# Patient Record
Sex: Male | Born: 1945 | Race: White | Hispanic: No | Marital: Married | Smoking: Former smoker
Health system: Southern US, Community
[De-identification: ages and names within clinical notes are randomized; demographics above are authoritative.]

## PROBLEM LIST (undated history)

## (undated) ENCOUNTER — Emergency Department (HOSPITAL_COMMUNITY): Admission: EM | Source: Home / Self Care

## (undated) DIAGNOSIS — Z720 Tobacco use: Secondary | ICD-10-CM

## (undated) DIAGNOSIS — J449 Chronic obstructive pulmonary disease, unspecified: Secondary | ICD-10-CM

## (undated) DIAGNOSIS — E785 Hyperlipidemia, unspecified: Secondary | ICD-10-CM

## (undated) DIAGNOSIS — I2111 ST elevation (STEMI) myocardial infarction involving right coronary artery: Secondary | ICD-10-CM

## (undated) DIAGNOSIS — I213 ST elevation (STEMI) myocardial infarction of unspecified site: Secondary | ICD-10-CM

## (undated) DIAGNOSIS — I6529 Occlusion and stenosis of unspecified carotid artery: Secondary | ICD-10-CM

## (undated) DIAGNOSIS — C140 Malignant neoplasm of pharynx, unspecified: Secondary | ICD-10-CM

## (undated) HISTORY — DX: Hyperlipidemia, unspecified: E78.5

## (undated) HISTORY — DX: Chronic obstructive pulmonary disease, unspecified: J44.9

## (undated) HISTORY — DX: Occlusion and stenosis of unspecified carotid artery: I65.29

## (undated) HISTORY — PX: APPENDECTOMY: SHX54

## (undated) HISTORY — DX: Tobacco use: Z72.0

## (undated) HISTORY — DX: ST elevation (STEMI) myocardial infarction involving right coronary artery: I21.11

## (undated) HISTORY — DX: ST elevation (STEMI) myocardial infarction of unspecified site: I21.3

---

## 2003-08-15 ENCOUNTER — Emergency Department (HOSPITAL_COMMUNITY): Admission: EM | Admit: 2003-08-15 | Discharge: 2003-08-15 | Payer: Self-pay | Admitting: Emergency Medicine

## 2003-08-15 ENCOUNTER — Encounter: Payer: Self-pay | Admitting: Emergency Medicine

## 2003-08-18 ENCOUNTER — Ambulatory Visit (HOSPITAL_COMMUNITY): Admission: RE | Admit: 2003-08-18 | Discharge: 2003-08-18 | Payer: Self-pay | Admitting: Otolaryngology

## 2003-08-19 ENCOUNTER — Encounter: Payer: Self-pay | Admitting: Otolaryngology

## 2003-08-23 ENCOUNTER — Ambulatory Visit (HOSPITAL_BASED_OUTPATIENT_CLINIC_OR_DEPARTMENT_OTHER): Admission: RE | Admit: 2003-08-23 | Discharge: 2003-08-23 | Payer: Self-pay | Admitting: Otolaryngology

## 2003-08-23 ENCOUNTER — Encounter (INDEPENDENT_AMBULATORY_CARE_PROVIDER_SITE_OTHER): Payer: Self-pay | Admitting: *Deleted

## 2003-08-23 ENCOUNTER — Ambulatory Visit (HOSPITAL_COMMUNITY): Admission: RE | Admit: 2003-08-23 | Discharge: 2003-08-23 | Payer: Self-pay | Admitting: Otolaryngology

## 2003-09-03 ENCOUNTER — Ambulatory Visit: Admission: RE | Admit: 2003-09-03 | Discharge: 2003-11-11 | Payer: Self-pay | Admitting: Radiation Oncology

## 2003-11-20 HISTORY — PX: THROAT SURGERY: SHX803

## 2003-12-09 ENCOUNTER — Ambulatory Visit: Admission: RE | Admit: 2003-12-09 | Discharge: 2003-12-09 | Payer: Self-pay | Admitting: Radiation Oncology

## 2004-01-18 ENCOUNTER — Encounter: Admission: RE | Admit: 2004-01-18 | Discharge: 2004-01-18 | Payer: Self-pay | Admitting: Otolaryngology

## 2004-01-19 ENCOUNTER — Ambulatory Visit (HOSPITAL_COMMUNITY): Admission: RE | Admit: 2004-01-19 | Discharge: 2004-01-19 | Payer: Self-pay | Admitting: Otolaryngology

## 2004-01-21 ENCOUNTER — Encounter (INDEPENDENT_AMBULATORY_CARE_PROVIDER_SITE_OTHER): Payer: Self-pay | Admitting: Specialist

## 2004-01-21 ENCOUNTER — Inpatient Hospital Stay (HOSPITAL_COMMUNITY): Admission: RE | Admit: 2004-01-21 | Discharge: 2004-01-29 | Payer: Self-pay | Admitting: Otolaryngology

## 2004-03-06 ENCOUNTER — Encounter: Admission: RE | Admit: 2004-03-06 | Discharge: 2004-04-04 | Payer: Self-pay | Admitting: Otolaryngology

## 2004-09-27 ENCOUNTER — Ambulatory Visit (HOSPITAL_COMMUNITY): Admission: RE | Admit: 2004-09-27 | Discharge: 2004-09-27 | Payer: Self-pay | Admitting: Otolaryngology

## 2004-10-04 ENCOUNTER — Ambulatory Visit (HOSPITAL_COMMUNITY): Admission: RE | Admit: 2004-10-04 | Discharge: 2004-10-04 | Payer: Self-pay | Admitting: Otolaryngology

## 2005-03-14 ENCOUNTER — Ambulatory Visit (HOSPITAL_COMMUNITY): Admission: RE | Admit: 2005-03-14 | Discharge: 2005-03-14 | Payer: Self-pay | Admitting: Otolaryngology

## 2006-03-22 ENCOUNTER — Ambulatory Visit (HOSPITAL_COMMUNITY): Admission: RE | Admit: 2006-03-22 | Discharge: 2006-03-22 | Payer: Self-pay | Admitting: Otolaryngology

## 2008-02-06 ENCOUNTER — Encounter: Admission: RE | Admit: 2008-02-06 | Discharge: 2008-02-06 | Payer: Self-pay | Admitting: Otolaryngology

## 2009-04-21 ENCOUNTER — Emergency Department (HOSPITAL_COMMUNITY): Admission: EM | Admit: 2009-04-21 | Discharge: 2009-04-21 | Payer: Self-pay | Admitting: Emergency Medicine

## 2009-12-30 ENCOUNTER — Inpatient Hospital Stay (HOSPITAL_COMMUNITY): Admission: EM | Admit: 2009-12-30 | Discharge: 2009-12-31 | Payer: Self-pay | Admitting: Emergency Medicine

## 2011-02-07 LAB — BASIC METABOLIC PANEL
BUN: 6 mg/dL (ref 6–23)
BUN: 8 mg/dL (ref 6–23)
CO2: 27 mEq/L (ref 19–32)
CO2: 29 mEq/L (ref 19–32)
Calcium: 8.3 mg/dL — ABNORMAL LOW (ref 8.4–10.5)
Calcium: 8.9 mg/dL (ref 8.4–10.5)
Chloride: 104 mEq/L (ref 96–112)
Creatinine, Ser: 1.17 mg/dL (ref 0.4–1.5)
GFR calc Af Amer: >60 mL/min (ref >60)
GFR calc non Af Amer: >60 mL/min (ref >60)
GFR calc non Af Amer: >60 mL/min (ref >60)
Glucose, Bld: 107 mg/dL — ABNORMAL HIGH (ref 70–99)
Glucose, Bld: 111 mg/dL — ABNORMAL HIGH (ref 70–99)
Potassium: 4 mEq/L (ref 3.5–5.1)
Potassium: 4 mEq/L (ref 3.5–5.1)
Sodium: 138 mEq/L (ref 135–145)

## 2011-02-07 LAB — CARDIAC PANEL(CRET KIN+CKTOT+MB+TROPI)
CK, MB: 1.6 ng/mL (ref 0.3–4.0)
CK, MB: 1.9 ng/mL (ref 0.3–4.0)
Relative Index: 1.1 (ref 0.0–2.5)
Total CK: 149 U/L (ref 7–232)
Total CK: 180 U/L (ref 7–232)
Troponin I: 0.01 ng/mL (ref 0.00–0.06)

## 2011-02-07 LAB — CBC
HCT: 35 % — ABNORMAL LOW (ref 39.0–52.0)
HCT: 36.3 % — ABNORMAL LOW (ref 39.0–52.0)
HCT: 43.3 % (ref 39.0–52.0)
Hemoglobin: 12.5 g/dL — ABNORMAL LOW (ref 13.0–17.0)
Hemoglobin: 14.8 g/dL (ref 13.0–17.0)
MCHC: 34.2 g/dL (ref 30.0–36.0)
MCHC: 34.5 g/dL (ref 30.0–36.0)
MCHC: 34.5 g/dL (ref 30.0–36.0)
MCV: 90.4 fL (ref 78.0–100.0)
MCV: 90.7 fL (ref 78.0–100.0)
Platelets: 135 10*3/uL — ABNORMAL LOW (ref 150–400)
Platelets: 144 10*3/uL — ABNORMAL LOW (ref 150–400)
Platelets: 168 10*3/uL (ref 150–400)
RBC: 4.01 MIL/uL — ABNORMAL LOW (ref 4.22–5.81)
RBC: 4.79 MIL/uL (ref 4.22–5.81)
RDW: 13.2 % (ref 11.5–15.5)
RDW: 13.4 % (ref 11.5–15.5)
RDW: 13.5 % (ref 11.5–15.5)
WBC: 8.2 10*3/uL (ref 4.0–10.5)
WBC: 8.6 10*3/uL (ref 4.0–10.5)

## 2011-02-07 LAB — DIFFERENTIAL
Basophils Absolute: 0 10*3/uL (ref 0.0–0.1)
Basophils Absolute: 0 10*3/uL (ref 0.0–0.1)
Basophils Relative: 0 % (ref 0–1)
Basophils Relative: 0 % (ref 0–1)
Eosinophils Absolute: 0 10*3/uL (ref 0.0–0.7)
Eosinophils Relative: 0 % (ref 0–5)
Eosinophils Relative: 6 % — ABNORMAL HIGH (ref 0–5)
Lymphocytes Relative: 21 % (ref 12–46)
Lymphocytes Relative: 29 % (ref 12–46)
Lymphs Abs: 1.8 10*3/uL (ref 0.7–4.0)
Monocytes Absolute: 0.7 10*3/uL (ref 0.1–1.0)
Monocytes Absolute: 0.7 10*3/uL (ref 0.1–1.0)
Monocytes Relative: 8 % (ref 3–12)
Neutro Abs: 6 10*3/uL (ref 1.7–7.7)
Neutrophils Relative %: 70 % (ref 43–77)

## 2011-02-07 LAB — MAGNESIUM: Magnesium: 1.9 mg/dL (ref 1.5–2.5)

## 2011-02-07 LAB — COMPREHENSIVE METABOLIC PANEL
ALT: 17 U/L (ref 0–53)
AST: 23 U/L (ref 0–37)
Albumin: 3 g/dL — ABNORMAL LOW (ref 3.5–5.2)
Alkaline Phosphatase: 63 U/L (ref 39–117)
BUN: 9 mg/dL (ref 6–23)
CO2: 26 mEq/L (ref 19–32)
Calcium: 7.7 mg/dL — ABNORMAL LOW (ref 8.4–10.5)
Chloride: 109 mEq/L (ref 96–112)
Creatinine, Ser: 1.13 mg/dL (ref 0.4–1.5)
GFR calc Af Amer: >60 mL/min (ref >60)
GFR calc non Af Amer: >60 mL/min (ref >60)
Glucose, Bld: 120 mg/dL — ABNORMAL HIGH (ref 70–99)
Potassium: 3.8 mEq/L (ref 3.5–5.1)
Sodium: 138 mEq/L (ref 135–145)
Total Bilirubin: 0.5 mg/dL (ref 0.3–1.2)
Total Protein: 6.1 g/dL (ref 6.0–8.3)

## 2011-02-07 LAB — CK TOTAL AND CKMB (NOT AT ARMC)
CK, MB: 1.1 ng/mL (ref 0.3–4.0)
Relative Index: INVALID (ref 0.0–2.5)
Total CK: 93 U/L (ref 7–232)

## 2011-02-07 LAB — LACTIC ACID, PLASMA: Lactic Acid, Venous: 1.2 mmol/L (ref 0.5–2.2)

## 2011-02-07 LAB — TROPONIN I: Troponin I: 0.01 ng/mL (ref 0.00–0.06)

## 2011-02-07 LAB — MRSA PCR SCREENING

## 2011-02-26 LAB — POCT CARDIAC MARKERS
CKMB, poc: <1 ng/mL — ABNORMAL LOW (ref 1.0–8.0)
Myoglobin, poc: 196 ng/mL (ref 12–200)
Troponin i, poc: <0.05 ng/mL (ref 0.00–0.09)

## 2011-02-26 LAB — POCT I-STAT, CHEM 8
BUN: 16 mg/dL (ref 6–23)
Chloride: 104 mEq/L (ref 96–112)
Creatinine, Ser: 1.3 mg/dL (ref 0.4–1.5)
Potassium: 3.8 mEq/L (ref 3.5–5.1)
Sodium: 136 mEq/L (ref 135–145)
TCO2: 20 mmol/L (ref 0–100)

## 2011-02-26 LAB — CBC
HCT: 43.2 % (ref 39.0–52.0)
Platelets: 205 10*3/uL (ref 150–400)
RDW: 12.8 % (ref 11.5–15.5)
WBC: 3.9 10*3/uL — ABNORMAL LOW (ref 4.0–10.5)

## 2011-02-26 LAB — DIFFERENTIAL
Basophils Absolute: 0 10*3/uL (ref 0.0–0.1)
Eosinophils Relative: 0 % (ref 0–5)
Lymphocytes Relative: 25 % (ref 12–46)
Lymphs Abs: 1 10*3/uL (ref 0.7–4.0)
Neutro Abs: 2.4 10*3/uL (ref 1.7–7.7)
Neutrophils Relative %: 63 % (ref 43–77)

## 2011-04-06 NOTE — Op Note (Signed)
NAME:  Cory Sloan, Cory Sloan NO.:  [id number]   MEDICAL RECORD NO.:  [id number]                   PATIENT TYPE:  AMB   LOCATION:  DSC                                  FACILITY:  [facility]   PHYSICIAN:  Karol T. Lazarus Salines, M.D.              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   POSTOPERATIVE DIAGNOSIS:  T1, N0, M0 squamous cell carcinoma, left vocal  cord.   OPERATION PERFORMED:   SURGEON:  Karol T. Lazarus Salines, M.D.   ANESTHESIA:  General bronchoscopic converted to general orotracheal  anesthesia.   ESTIMATED BLOOD LOSS:  Minimal.   COMPLICATIONS:  None.   FINDINGS:  A bulky plaque-like tumor involving the superior surface and free  edge of the left vocal cord from just in front of the vocal process all the  way to the anterior commissure.  No obvious subglottic involvement.  Into  the ventricle but not defacing the ventricle or onto the false cord.  Some  polypoid changes of the right vocal cord but no obvious neoplasm.  No neck  adenopathy.  Frozen section positive for invasive squamous cell carcinoma.   DESCRIPTION OF PROCEDURE:  With the patient in a comfortable supine  position, general mask anesthesia was administered.  At an appropriate  level, the table was turned 90 degrees and the patient placed in reverse  Trendelenburg.  A moist 4 x 4 was used to protect the upper gums.  The  Jackson laryngoscope was introduced, insinuated under the epiglottis, and  the glottis was visualized.  The lesion was briefly inspected and a biopsy  was taken for frozen section.  The slide was removed from the laryngoscope  and the bronchoscope was introduced through the laryngoscope into the  trachea without difficulty.  Ventilation was assumed per bronchoscope.  Full  rigid bronchoscopy down to the level of the  secondary bronchi was performed  with no significant findings.  Bronchial washings were taken from the left  and right mainstem bronchi in the standard fashion.  The bronchoscope was  removed.   The Jackson laryngoscope was reintroduced and under direct visualization,  the patient was intubated with a 6.5 mm PVC tube.  Ventilation was assumed  per endotracheal tube.   The cervical esophagoscope was lubricated and inserted into the pharynx and  passed gently to the cricopharyngeus. It was inserted to its full length  with no significant findings and then removed.  Both necks were palpated and  the entire oral cavity was also palpated with no significant findings.  At  this point the Dedo laryngoscope was introduced and carefully poised in the  supraglottis and positioned for good visualization.  It was suspended in the  standard fashion.  The microscope was brought into the field and the  lesion  could be more carefully inspected.  The scope was repositioned several times  to assess the full extent of the tumor.  Photographs were taken.  A biopsy  was taken just lateral to the anterior commissure and just in front of the  vocal process to determine the anterior and posterior extent of the lesion.  A biopsy was taken from the apparent polypoid changes in the midbody of the  right vocal cord. These were sent for a permanent section.  4% cocaine  solution moistened pledgets were placed against the vocal cord for  anesthesia and hemostasis and allowed to remain in place for several  minutes.  Upon removal, hemostasis was observed.  At this point the  laryngoscope was unsuspended.  The endotracheal tube was moved forward, the  posterior commissure was inspected.  The valleculae, piriforms and  postcricoid larynx were inspected, all with no significant findings.  At  this point the procedure was completed.  The scope was removed as was the  gum protector.  There was mild bruising of the right  upper alveolar mucosa.  The patient was returned to anesthesia, awakened, extubated, and transferred  to recovery in stable condition.   COMMENT:  A 65 year old white male with a 40+ pack year smoking history  presented to my office last week with a several month history of progressive  hoarseness and abnormal examination. Hence the indication for today's  procedure.  Although this is still a T1 lesion, it represents a large T1  lesion that I do not think is resectable with a laser.  We will await the  biopsy results.  He is a candidate for radiation therapy versus  hemilaryngectomy for cure.  Given low anticipated risks of post anesthetic  or post surgical complications,  I feel an outpatient venue is appropriate.                                               Gloris Manchester. Lazarus Salines, M.D.    KTW/MEDQ  D:  08/23/2003  T:  08/23/2003  Job:  [id number]

## 2011-04-06 NOTE — Discharge Summary (Signed)
NAME:  Cory Sloan, Cory Sloan NO.:  [id number]   MEDICAL RECORD NO.:  [id number]                   PATIENT TYPE:  INP   LOCATION:  5740                                 FACILITY:  [facility]   PHYSICIAN:  Zola Button T. Lazarus Salines, M.D.              DATE OF BIRTH:  October 05, 1946   DATE OF ADMISSION:  01/21/2004  DATE OF DISCHARGE:  01/29/2004                                 DISCHARGE SUMMARY   CHIEF COMPLAINT:  Laryngeal cancer.   HISTORY OF PRESENT ILLNESS:  A 65 year old white male, 40-pack-year smoker,  had persistent hoarseness with a T1, N0, Stage I squamous cell carcinoma of  the left vocal cord in October 2004.  He received full course radiation  therapy for cure.  Post treatment, he had persistent irregularity and then  recent swelling and pain suggesting either radio chondronecrosis versus  recurrent carcinoma.  No neck masses.  He is swallowing comfortably and  maintaining his weight.   PAST MEDICAL HISTORY:   ALLERGIES:  No known allergies.   CURRENT MEDICATIONS:  He is currently on Cipro and clindamycin.   No history of diabetes, stroke, asthma, epilepsy, HIV, hepatitis.  No  history of bleeding problems or anesthetic reactions.  No history of blood  clots.  No family history of bleeding or anesthetic problems.   REVIEW OF SYSTEMS:  Positive for reflux.   PHYSICAL EXAMINATION:  GENERAL:  Sturdy, trim, basically healthy-appearing,  middle-aged white male.  He is quite raspy with slight stridor.  HEAD AND NECK:  Examination remarkable for swollen supraglottis with  irregular vocal cord and a fair airway at best.  No neck adenopathy.  CHEST: Mild wheezing.  HEART:  Regular rate and rhythm without murmurs.  ABDOMEN:  Soft and active.  EXTREMITIES:  Normal configuration.   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   HOSPITAL COURSE:  On the day of his admission, the patient was taken to the  operating room for  direct laryngoscopy and biopsy with plan to perform a  tracheostomy and consider hyperbaric oxygen therapy if this was in fact  radionecrosis and to proceed with a total laryngectomy if it was cancer.  The frozen section was positive for recurrent carcinoma.  The patient  underwent a total laryngectomy.  Postoperatively, he did nicely with minimal  wound drainage and with a clean wide open stoma.  The patient was promptly  begun on self and family home care teaching.  Speech pathology worked with  the patient regarding alternate speech modalities.  He was hungry quickly  and healing quickly.  He spent three days in the intensive care unit and  then out to the regular floor.   The drains were removed on the fourth postoperative day.  He had social  services assist with finances.  The family was interested in considering a  tracheoesophageal puncture procedure for speech.  I deferred this until his  recovery was more complete.  The patient did nicely with the electrolarynx  in the hospital.  Home health was used to arrange for home needs including  humidifier, suction, and various supplies.  The patient had nasogastric  feeding during the first six postoperative days with good tolerance.  On the  sixth postoperative day, he had a grape juice trial which was successful,  and at this point he resumed p.o. liquids.  The wound drains had been  removed on the fourth postoperative day.   He was discharged to his home on the eighth postoperative day to the care of  his family.  He had prescriptions at home for clindamycin liquid, Roxicet  liquid, and Tylenol with Codeine liquid.  He was instructed to use dietary  supplements including Ensure or Boost.  He had been taught wound care and  especially stoma care.  He will follow up as an outpatient with speech  pathology.  He will have the home visiting nurses coming daily for a short  period of time until things are stable and completely healed.  I  will see  him back in my office five days after discharge.   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   PROCEDURES:  Direct laryngoscopy and biopsy, total laryngectomy.   COMPLICATIONS:  None.   CONDITION ON DISCHARGE:  Ambulatory, pain controlled, taking p.o. fluids,  and breathing comfortably through his stoma.   PRESCRIPTIONS:  Clindamycin, Roxicet, Tylenol with codeine.   RETURN VISIT:  In five days.                                                Gloris Manchester. Lazarus Salines, M.D.    KTW/MEDQ  D:  03/11/2004  T:  03/13/2004  Job:  [id number]

## 2011-04-06 NOTE — Op Note (Signed)
NAME:  Cory Sloan, Cory Sloan NO.:  [id number]   MEDICAL RECORD NO.:  [id number]                   PATIENT TYPE:  INP   LOCATION:  2113                                 FACILITY:  [facility]   PHYSICIAN:  Gloris Manchester. Lazarus Salines, M.D.              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   POSTOPERATIVE DIAGNOSIS:  Recurrent carcinoma.   PROCEDURE PERFORMED:  Total laryngectomy, direct laryngoscopy and biopsy.   SURGEON:  Gloris Manchester. Lazarus Salines, M.D.   ASSISTANT:  Kathy Breach, M.D.   ANESTHESIA:  General orotracheal converted to general tracheostomal.   ESTIMATED BLOOD LOSS:  100 mL.   COMPLICATIONS:  None.   FINDINGS:  A dense ulcerated and somewhat exophytic lesion occupying the  bulk of the left true vocal cord from the vocal process all the way forward  to the anterior commissure and down into the ventricle.  No involvement of  the epiglottis or false cord.  No subglottic extension.  No neck adenopathy.  Frozen section positive for invasive squamous cell carcinoma.   DESCRIPTION OF PROCEDURE:  The patient was the patient in a comfortable  supine position, general inhalation was administered.  At an appropriate  level, the table was turned 90 degrees and the patient placed in a slight  reverse Trendelenburg.  A moist 4 x 4 was used to protect the upper gum.  The Holinger laryngoscope was introduced to inspect the larynx.  The patient  had some coughing, and anesthesia was concerned about the possibility of  laryngospasm.  At this point, the patient received muscle relaxants.  Ventilation was continued per mask.   The laryngoscope was introduced once again.  There was substantial edema of  the left aryepiglottic fold, arytenoid, medial wall of the  piriform sinus  and the postcricoid mucosa.  The epiglottis was relatively normal.  The  right larynx was normal.  The lesion as described above on the left anterior  vocal cord was identified.  Several biopsies were taken and sent for frozen  section interpretation.  Hemostasis was spontaneous.   After getting the biopsies out, the [facility] laryngoscope was introduced and  the patient was intubated without difficulty.  Anesthesia was obtained per  orotracheal tube.   The Holinger laryngoscope was reintroduced and full inspection of the larynx  and hypopharynx was performed with the findings as described above.  Palpation revealed no lesions of the base of tongue.  Palpation of the neck  revealed no adenopathy.   The pathology report returned as unequivocal invasive squamous cell  carcinoma.  Election was made to proceed with total laryngectomy as planned.  The family was informed.   At this point, the patient was turned back up toward anesthesia.  A shoulder  roll was placed and the neck was thoroughly extended.  The neck was palpated  once again with the findings as described above.  A Foley catheter was  inserted in the standard fashion.  A sterile preparation and draping from  the mouth down to the clavicles and including both sides of the neck was  performed.   Again the findings were as described above.  A pre-existing skin wrinkle  running across the prominence of the thyroid cartilage was selected for the  incision and a 16 cm transverse incision was sharply executed.  There was  moderate bleeding from the subdermal plexus consistent with prior radiation.  This was controlled with cautery.  Using the cautery, the platysma muscle  was lysed on both sides and subplatysmal planes were elevated superiorly to  the level of the hyoid bone and inferiorly to the sternal notch in the  midline.  Tie-back stitches were not required.  Working in the lateral  surface of the  sternocleidomastoid muscle, the fascia was incised and rolled  medially.  Anterior jugular veins were identified and were divided high and  low.  The dissection was carried down between the heads of the  sternocleidomastoid muscles down to the strap muscles and in a triangular  fashion all the way across the hyoid bone.  The fascia was dissected free  from the lateral surfaces of the strap muscles toward the midline.  The  fascia inferiorly was swept upward.  The strap muscles were then divided in  the midline and the small thyroid isthmus was identified and divided to the  right of the midline and controlled with a 2-0 silk suture ligature.  The  thyroid was cleaned from the face of the trachea.  The fascia was worked off  the lateral surface of the strap muscles and then the sternothyroid and  sternohyoid muscles and omohyoid muscles were divided lateral to the larynx  and kept with the specimen.  Dissection was deepened at the level of the  hyoid bone.  Working the thyroid lobe free from the trachea on both sides,  the dissection was carried along the medial edge of the thyroid lobe.  The  thyroid vascular pedicle was identified and divided.  Working further  superiorly, the superior laryngeal pedicle was identified and divided.  Both  of these were controlled with 3-0 silk ligature.  The larynx at this point  was fully skeletonized.  The constrictor muscles were divided from the  posterior free edge of the thyroid cartilage  on both sides.  The greater  cornu of the thyroid cartilage was developed and dissected.  The hyoid bone  was grasped and using a curved Mayo scissors hugging the medial surface, the  greater horn was delivered on each side.  Using cautery, the midportion of  the hyoid bone was delivered.  At this point, the piriform was entered on  the right side and under direct vision carried up to the vallecula and down  to the postcricoid larynx.  In the second-third  interspace, the trachea was entered, dividing in between  the rings.  At this point under direct vision the patient was intubated  directly into the trachea and the orotracheal tube was removed.  An inverted  V mucosa was performed on the posterior surface of the trachea.  The trachea  was supported with an Allis clamp to prevent retraction into the  mediastinum.  Ventilation was continued per the tracheal stump.   An [employer]  clamp was used to grasp the proximal trachea and working in the  gray line between the tracheal mucosa and the esophagus, the dissection  was carried upward to the postcricoid larynx.  Branches of the subpharyngeal  plexus were controlled with cautery.   Returning to the larynx, the epiglottis was grasped and delivered.  Working  around the vallecula and working around the postcricoid larynx inferiorly,  preserving as much mucosa as possible, finally the larynx was fully excised.  Hemostasis was observed.   The larynx was taken from the field and sectioned, revealing the invasive  tumor on the anterior left vocal cord.  Good margins were felt to have been  accomplished.  The tumor was sent for permanent pathologic interpretation.  All gloves and instruments were changed that had contacted the tumor.   At this point, a circular stoma was fashioned in the skin inferior to the  incision.  The dissection was carried out approximately 1 cm in all  directions in the subdermal fat to enhance the final healing configuration  of the stoma.  Ethibond 2-0 sutures were placed in a vertical mattress  fashion through the skin, behind the second tracheal ring submucosally, and  back out through the skin in all four quadrants.  This brought the trachea  up to the skin.  The endotracheal tube was passed through the stoma into the  trachea once again and ventilation continued.   The pharynx was closed using 3-0 Vicryl running inverting sutures in a T  configuration.  A second layer  of serosa was reapproximated over the suture  line.  An airtight closure was felt to have been accomplished.  Note that  prior to closing the pharynx, a nasogastric tube was passed through the left  nose and into the stomach.   After closing the pharynx, the neck was thoroughly irrigated.  Hemostasis  was observed.  Valsalva was used and again hemostasis was observed.  Ten  French Hemovac drains were placed in both sides of the neck and secured to  the neck with a 2-0 Ethilon stitch.   The stoma was completed with a running simple 5-0 chromic suture between the  mucosa and the skin edge in a circumferential fashion.   At this point, the neck flaps were reapproximated after first removing the  shoulder roll.  Chromic 4-0 was used in the platysmal layer.  The skin was  reapproximated in an airtight fashion using staples.  The drains were placed  to suction and an intact closure was observed.  At this point the neck and stoma were cleaned and coated with a thin coat of bacitracin ointment.  The  external nose was painted with Benzoin and the NG tube was secured with a  standard NG tube holder.  At this point the procedure was completed and the  patient was returned to anesthesia, awakened, extubated, and transferred to  recovery in stable condition.   COMMENT:  A 66 year old white male with a heavy smoking history, diagnosed  with a T1 vocal cord carcinoma approximately four months ago.  He has  developed recent pain, dysphagia, swelling, and early dyspnea with an  endolaryngeal lesion consistent with chondronecrosis or recurrent carcinoma.  Frozen section was positive for recurrent carcinoma, hence the indication  for today's procedure.  Anticipate a routine postoperative recovery with  attention to suction drainage, stomal hygiene, elevation, analgesia,  antibiosis, and nasogastric feeding.  Gloris Manchester. Lazarus Salines, M.D.    KTW/MEDQ  D:   01/21/2004  T:  01/23/2004  Job:  [id number]   cc:   Artist Pais Kathrynn Running, M.D.  [address] [facility]  [place]  [place] [address]  Fax: [phone]

## 2012-01-21 ENCOUNTER — Ambulatory Visit
Admission: RE | Admit: 2012-01-21 | Discharge: 2012-01-21 | Disposition: A | Discharge status: Home / Self Care | Payer: Self-pay | Source: Ambulatory Visit | Attending: Otolaryngology | Admitting: Otolaryngology

## 2012-01-21 ENCOUNTER — Other Ambulatory Visit: Payer: Self-pay | Admitting: Otolaryngology

## 2012-01-21 DIAGNOSIS — J209 Acute bronchitis, unspecified: Secondary | ICD-10-CM

## 2013-04-03 ENCOUNTER — Encounter (HOSPITAL_COMMUNITY): Payer: Self-pay | Admitting: Emergency Medicine

## 2013-04-03 ENCOUNTER — Emergency Department (HOSPITAL_COMMUNITY)
Admission: EM | Admit: 2013-04-03 | Discharge: 2013-04-04 | Disposition: A | Discharge status: Home / Self Care | Payer: Medicare Other | Attending: Emergency Medicine | Admitting: Emergency Medicine

## 2013-04-03 DIAGNOSIS — J439 Emphysema, unspecified: Secondary | ICD-10-CM

## 2013-04-03 DIAGNOSIS — R0789 Other chest pain: Secondary | ICD-10-CM

## 2013-04-03 DIAGNOSIS — J438 Other emphysema: Secondary | ICD-10-CM | POA: Insufficient documentation

## 2013-04-03 DIAGNOSIS — Z79899 Other long term (current) drug therapy: Secondary | ICD-10-CM | POA: Insufficient documentation

## 2013-04-03 DIAGNOSIS — Z85819 Personal history of malignant neoplasm of unspecified site of lip, oral cavity, and pharynx: Secondary | ICD-10-CM | POA: Insufficient documentation

## 2013-04-03 DIAGNOSIS — R0602 Shortness of breath: Secondary | ICD-10-CM

## 2013-04-03 DIAGNOSIS — R059 Cough, unspecified: Secondary | ICD-10-CM | POA: Insufficient documentation

## 2013-04-03 DIAGNOSIS — R05 Cough: Secondary | ICD-10-CM | POA: Insufficient documentation

## 2013-04-03 HISTORY — DX: Malignant neoplasm of pharynx, unspecified: C14.0

## 2013-04-03 LAB — CBC
MCV: 87 fL (ref 78.0–100.0)
Platelets: 215 10*3/uL (ref 150–400)
RBC: 4.54 MIL/uL (ref 4.22–5.81)
RDW: 13 % (ref 11.5–15.5)
WBC: 7.5 10*3/uL (ref 4.0–10.5)

## 2013-04-03 LAB — POCT I-STAT TROPONIN I: Troponin i, poc: 0 ng/mL (ref 0.00–0.08)

## 2013-04-03 MED ORDER — ASPIRIN 325 MG PO TABS
325.0000 mg | ORAL_TABLET | ORAL | Status: AC
  Administered 2013-04-04: 325 mg via ORAL
  Filled 2013-04-03: qty 1

## 2013-04-03 MED ORDER — NITROGLYCERIN 0.4 MG SL SUBL: 0.4000 mg | SUBLINGUAL_TABLET | SUBLINGUAL | Status: DC | PRN

## 2013-04-03 NOTE — ED Notes (Signed)
PT. REPORTS CHEST TIGHTNESS WITH SOB AND PRODUCTIVE COUGH FOR DAYS , DENIES NAUSEA OR VOMITTING , SLIGHT DIAPHORESIS , PT. HAS HISTORY OF THROAT CANCER USES AN ELECTRONIC VOICE BOX.

## 2013-04-04 ENCOUNTER — Emergency Department (HOSPITAL_COMMUNITY): Payer: Medicare Other

## 2013-04-04 LAB — PRO B NATRIURETIC PEPTIDE: Pro B Natriuretic peptide (BNP): 134.4 pg/mL — ABNORMAL HIGH (ref 0–125)

## 2013-04-04 LAB — BASIC METABOLIC PANEL
CO2: 24 mEq/L (ref 19–32)
Calcium: 8.9 mg/dL (ref 8.4–10.5)
Chloride: 105 mEq/L (ref 96–112)
Creatinine, Ser: 1.07 mg/dL (ref 0.50–1.35)
GFR calc Af Amer: 82 mL/min — ABNORMAL LOW (ref >90)
Sodium: 139 mEq/L (ref 135–145)

## 2013-04-04 MED ORDER — ALBUTEROL SULFATE HFA 108 (90 BASE) MCG/ACT IN AERS
2.0000 | INHALATION_SPRAY | Freq: Once | RESPIRATORY_TRACT | Status: AC
  Administered 2013-04-04: 2 via RESPIRATORY_TRACT
  Filled 2013-04-04: qty 6.7

## 2013-04-04 MED ORDER — ALBUTEROL SULFATE HFA 108 (90 BASE) MCG/ACT IN AERS: 1.0000 | INHALATION_SPRAY | Freq: Four times a day (QID) | RESPIRATORY_TRACT | Status: DC | PRN

## 2013-04-04 MED ORDER — AZITHROMYCIN 250 MG PO TABS: ORAL_TABLET | ORAL | Status: DC

## 2013-04-04 NOTE — ED Provider Notes (Signed)
History     CSN: [id number]  Arrival date & time 04/03/13  2317   First MD Initiated Contact with Patient 04/04/13 0049      Chief Complaint  Patient presents with  . Chest Pain  . Shortness of Breath    (Consider location/radiation/quality/duration/timing/severity/associated sxs/prior treatment) HPI Cory Sloan is a 67 y.o. male w hx of bronchitis & throat cancer presents to the ER c/o chest tightness and SOB.  Patient reports similar symptoms in past, but this time is much worse.  Patient reports having a cough for the last couple of days with no change in sputum production.  Chest tightness is relieved with albuterol inhaler and worsened with exertion.  Pain is non-positional.  Severity is 4/10, constant, located substernally and without radiation.  Patient denies any cardiac history but reports a significant family history with both his brothers having heart attacks before the age of 45.  Patient states that he does not have a cardiologist or primary care physician.  Last stress test was "years ago."  Patient denies any PND, orthopnea, leg swelling, diaphoresis, nausea, left arm pain, jaw pain fevers, night sweats, chills, weight loss.  Past Medical History  Diagnosis Date  . Throat cancer     Past Surgical History  Procedure Laterality Date  . Appendectomy    . Throat surgery      No family history on file.  History  Substance Use Topics  . Smoking status: Never Smoker   . Smokeless tobacco: Not on file  . Alcohol Use: No      Review of Systems  All other systems reviewed and are negative.    Allergies  Review of patient's allergies indicates no known allergies.  Home Medications   Current Outpatient Rx  Name  Route  Sig  Dispense  Refill  . albuterol (PROVENTIL) (2.5 MG/3ML) 0.083% nebulizer solution   Nebulization   Take 2.5 mg by nebulization every 6 (six) hours as needed for wheezing or shortness of breath.         Marland Kitchen ibuprofen (ADVIL,MOTRIN)  200 MG tablet   Oral   Take 400 mg by mouth every 6 (six) hours as needed for pain or headache.           BP 110/79  Pulse 63  Temp(Src) 98.1 F (36.7 C) (Oral)  Resp 17  SpO2 97%  Physical Exam  Nursing note and vitals reviewed. Constitutional: He appears well-developed and well-nourished. No distress.  HENT:  Head: Normocephalic and atraumatic.  Eyes: Conjunctivae and EOM are normal. Pupils are equal, round, and reactive to light.  Neck: Normal range of motion. Neck supple. Normal carotid pulses and no JVD present. Carotid bruit is not present. No rigidity. Normal range of motion present.  laryngectomy neck cover in place. Electronic voice box   Cardiovascular: Normal rate, regular rhythm, S1 normal, S2 normal, normal heart sounds, intact distal pulses and normal pulses.  Exam reveals no gallop and no friction rub.   No murmur heard. No pitting edema bilaterally, RRR, no aberrant sounds on auscultations, distal pulses intact, no carotid bruit or JVD.   Pulmonary/Chest: Effort normal and breath sounds normal. No accessory muscle usage or stridor. No respiratory distress. He exhibits no tenderness and no bony tenderness.  LCAB   Abdominal: Bowel sounds are normal.  Soft non tender. Non pulsatile aorta.   Skin: Skin is warm, dry and intact. No rash noted. He is not diaphoretic. No cyanosis. Nails show no clubbing.  ED Course  Procedures (including critical care time)  Labs Reviewed  BASIC METABOLIC PANEL - Abnormal; Notable for the following:    GFR calc non Af Amer 70 (*)    GFR calc Af Amer 82 (*)    All other components within normal limits  PRO B NATRIURETIC PEPTIDE - Abnormal; Notable for the following:    Pro B Natriuretic peptide (BNP) 134.4 (*)    All other components within normal limits  CBC  POCT I-STAT TROPONIN I  POCT I-STAT TROPONIN I   Dg Chest 2 View  04/04/2013   *RADIOLOGY REPORT*  Clinical Data: Chest tightness and shortness of breath.  History of  throat cancer.  CHEST - 2 VIEW  Comparison: 01/21/2012  Findings: Gas density in the neck favors prior laryngectomy.  Thoracic spondylosis noted.  Emphysema is present. Cardiac and mediastinal contours appear unremarkable.  IMPRESSION:  1.  Emphysema. 2.  Gas density in the neck, suspicious for laryngectomy.   Original Report Authenticated By: Gaylyn Rong, M.D.    Date: 04/04/2013  Rate: 73  Rhythm: normal sinus rhythm  QRS Axis: normal  Intervals: normal  ST/T Wave abnormalities: normal  Conduction Disutrbances: none  Narrative Interpretation:   Old EKG Reviewed: No significant changes noted     1. SOB (shortness of breath)   2. Chest tightness   3. Emphysema       MDM  Patient to ER complaining of chest tightness, shortness of breath, and cough.  Likely etiology is COPD exacerbation.  Patient denies change in sputum production, however he was given a Z-Pak at discharge in case of fever development or sputum change.  In addition patient reports that he is out of his albuterol inhaler which was also given.  Based on patient's age and significant family history it was strongly advised to have a stress test.  Patient was offered to be admitted, however he preferred to be discharged.  Extremely strict return precautions discussed for any worsening of chest pain as well as description of anginal type symptoms.  Patient is to call cardiology first thing Monday morning for a outpatient stress test next week.  Patient and his wife agree with plan.  Case seen with Dr. Anitra Lauth who also agrees with disposition.        Jaci Carrel, [place] 04/04/13 [phone]

## 2013-04-04 NOTE — ED Provider Notes (Signed)
Medical screening examination/treatment/procedure(s) were conducted as a shared visit with non-physician practitioner(s) and myself.  I personally evaluated the patient during the encounter Patient with atypical chest pain which he describes as a tightness that improves with albuterol and a persistent cough. Symptoms have been ongoing for 8 hours and troponin x2 were negative. Patient does have a strong family history of cardiac chest pain however patient himself does not have any cardiac history. That patient would benefit from a stress test however did not feel that he needs admission for this and can do this as an outpatient. Feel that his chest tightness is more likely do to lung pathology today. Patient given strict return precautions and discharged  Gwyneth Sprout, MD 04/04/13 [phone]

## 2015-09-20 DIAGNOSIS — I213 ST elevation (STEMI) myocardial infarction of unspecified site: Secondary | ICD-10-CM

## 2015-09-20 HISTORY — DX: ST elevation (STEMI) myocardial infarction of unspecified site: I21.3

## 2015-09-29 ENCOUNTER — Emergency Department (HOSPITAL_COMMUNITY)
Admission: EM | Admit: 2015-09-29 | Discharge: 2015-09-29 | Disposition: A | Discharge status: Home / Self Care | Payer: Medicare Other | Attending: Emergency Medicine | Admitting: Emergency Medicine

## 2015-09-29 ENCOUNTER — Encounter (HOSPITAL_COMMUNITY): Payer: Self-pay | Admitting: *Deleted

## 2015-09-29 ENCOUNTER — Emergency Department (HOSPITAL_COMMUNITY): Payer: Medicare Other

## 2015-09-29 DIAGNOSIS — R079 Chest pain, unspecified: Secondary | ICD-10-CM | POA: Diagnosis not present

## 2015-09-29 DIAGNOSIS — R0602 Shortness of breath: Secondary | ICD-10-CM | POA: Diagnosis present

## 2015-09-29 DIAGNOSIS — J441 Chronic obstructive pulmonary disease with (acute) exacerbation: Secondary | ICD-10-CM | POA: Diagnosis not present

## 2015-09-29 DIAGNOSIS — Z79899 Other long term (current) drug therapy: Secondary | ICD-10-CM | POA: Diagnosis not present

## 2015-09-29 LAB — BASIC METABOLIC PANEL
Anion gap: 7 (ref 5–15)
BUN: 14 mg/dL (ref 6–20)
CALCIUM: 9.4 mg/dL (ref 8.9–10.3)
CHLORIDE: 102 mmol/L (ref 101–111)
CO2: 27 mmol/L (ref 22–32)
CREATININE: 1.06 mg/dL (ref 0.61–1.24)
Glucose, Bld: 90 mg/dL (ref 65–99)
Potassium: 4.6 mmol/L (ref 3.5–5.1)
SODIUM: 136 mmol/L (ref 135–145)

## 2015-09-29 LAB — I-STAT TROPONIN, ED: TROPONIN I, POC: 0.01 ng/mL (ref 0.00–0.08)

## 2015-09-29 LAB — CBC
HCT: 45.4 % (ref 39.0–52.0)
Hemoglobin: 15 g/dL (ref 13.0–17.0)
MCH: 30.1 pg (ref 26.0–34.0)
MCHC: 33 g/dL (ref 30.0–36.0)
MCV: 91.2 fL (ref 78.0–100.0)
PLATELETS: 236 10*3/uL (ref 150–400)
RBC: 4.98 MIL/uL (ref 4.22–5.81)
RDW: 12.9 % (ref 11.5–15.5)
WBC: 6.5 10*3/uL (ref 4.0–10.5)

## 2015-09-29 LAB — TROPONIN I

## 2015-09-29 MED ORDER — KETOROLAC TROMETHAMINE 30 MG/ML IJ SOLN
15.0000 mg | Freq: Once | INTRAMUSCULAR | Status: AC
  Administered 2015-09-29: 15 mg via INTRAVENOUS
  Filled 2015-09-29: qty 1

## 2015-09-29 MED ORDER — FENTANYL CITRATE (PF) 100 MCG/2ML IJ SOLN
50.0000 ug | Freq: Once | INTRAMUSCULAR | Status: AC
  Administered 2015-09-29: 50 ug via INTRAVENOUS
  Filled 2015-09-29: qty 2

## 2015-09-29 MED ORDER — AZITHROMYCIN 250 MG PO TABS: 250.0000 mg | ORAL_TABLET | Freq: Every day | ORAL | Status: DC

## 2015-09-29 MED ORDER — DEXTROSE 5 % IV SOLN
500.0000 mg | Freq: Once | INTRAVENOUS | Status: AC
  Administered 2015-09-29: 500 mg via INTRAVENOUS
  Filled 2015-09-29: qty 500

## 2015-09-29 MED ORDER — ALBUTEROL SULFATE HFA 108 (90 BASE) MCG/ACT IN AERS
2.0000 | INHALATION_SPRAY | Freq: Once | RESPIRATORY_TRACT | Status: DC
  Filled 2015-09-29: qty 6.7

## 2015-09-29 MED ORDER — IPRATROPIUM-ALBUTEROL 0.5-2.5 (3) MG/3ML IN SOLN
3.0000 mL | RESPIRATORY_TRACT | Status: AC
Start: 2015-09-29 — End: 2015-09-29
  Administered 2015-09-29 (×3): 3 mL via RESPIRATORY_TRACT
  Filled 2015-09-29: qty 6
  Filled 2015-09-29: qty 3

## 2015-09-29 MED ORDER — ALBUTEROL SULFATE HFA 108 (90 BASE) MCG/ACT IN AERS: 1.0000 | INHALATION_SPRAY | RESPIRATORY_TRACT | Status: DC | PRN

## 2015-09-29 MED ORDER — MAGNESIUM SULFATE 2 GM/50ML IV SOLN
2.0000 g | Freq: Once | INTRAVENOUS | Status: AC
  Administered 2015-09-29: 2 g via INTRAVENOUS
  Filled 2015-09-29: qty 50

## 2015-09-29 MED ORDER — PREDNISONE 20 MG PO TABS: ORAL_TABLET | ORAL | Status: DC

## 2015-09-29 MED ORDER — SODIUM CHLORIDE 0.9 % IV BOLUS (SEPSIS)
1000.0000 mL | Freq: Once | INTRAVENOUS | Status: AC
Start: 2015-09-29 — End: 2015-09-29
  Administered 2015-09-29: 1000 mL via INTRAVENOUS

## 2015-09-29 MED ORDER — METHYLPREDNISOLONE SODIUM SUCC 125 MG IJ SOLR
125.0000 mg | Freq: Once | INTRAMUSCULAR | Status: AC
  Administered 2015-09-29: 125 mg via INTRAVENOUS
  Filled 2015-09-29: qty 2

## 2015-09-29 NOTE — ED Provider Notes (Signed)
CSN: [id number]     Arrival date & time 09/29/15  [id number] History   First MD Initiated Contact with Patient 09/29/15 [phone]     Chief Complaint  Patient presents with  . Chest Pain  . Shortness of Breath     (Consider location/radiation/quality/duration/timing/severity/associated sxs/prior Treatment) Patient is a 69 y.o. male presenting with chest pain and shortness of breath.  Chest Pain Pain location:  R chest and R lateral chest Pain quality: aching and sharp   Pain radiates to:  Does not radiate Pain radiates to the back: no   Onset quality:  Gradual Duration:  1 month Timing:  Intermittent Context: breathing   Context: not at rest and no trauma   Relieved by: hitting chest with fist. Associated symptoms: shortness of breath   Associated symptoms: no AICD problem, no anorexia, no dizziness, no fatigue, no fever, no headache, no lower extremity edema, no nausea and no numbness   Shortness of Breath Associated symptoms: chest pain   Associated symptoms: no fever and no headaches    69 year old male with a history of throat cancer status post laryngectomyalso COPD presents with 1 month of progressively worsening shortness of breath cough and increased sputum production associated with chest pain. Only has chest pain with equal breath and with coughing worse after coughing. No fevers. Had a history of this one time before. Not on any breathing medications at home. Only way he helps it is by hitting his chest. No exacerbating symptoms. No other associated symptoms.  Past Medical History  Diagnosis Date  . Throat cancer [facility])    Past Surgical History  Procedure Laterality Date  . Appendectomy    . Throat surgery     No family history on file. Social History  Substance Use Topics  . Smoking status: Never Smoker   . Smokeless tobacco: None  . Alcohol Use: No    Review of Systems  Constitutional: Negative for fever and fatigue.  Respiratory: Positive for shortness of breath.    Cardiovascular: Positive for chest pain.  Gastrointestinal: Negative for nausea and anorexia.  Neurological: Negative for dizziness, numbness and headaches.  All other systems reviewed and are negative.     Allergies  Review of patient's allergies indicates no known allergies.  Home Medications   Prior to Admission medications   Medication Sig Start Date End Date Taking? Authorizing Provider  albuterol (PROVENTIL HFA;VENTOLIN HFA) 108 (90 BASE) MCG/ACT inhaler Inhale 1-2 puffs into the lungs every 4 (four) hours as needed for wheezing or shortness of breath (or coughing). 09/29/15   Merrily Pew, MD  azithromycin (ZITHROMAX) 250 MG tablet Take 1 tablet (250 mg total) by mouth daily. Take first 2 tablets together, then 1 every day until finished. 09/30/15   Merrily Pew, MD  predniSONE (DELTASONE) 20 MG tablet 3 tabs po day one, then 2 po daily x 4 days 09/30/15   Merrily Pew, MD   BP 127/83 mmHg  Pulse 90  Temp(Src) 98.7 F (37.1 C) (Oral)  Resp 21  SpO2 96% Physical Exam  Constitutional: He is oriented to person, place, and time. He appears well-developed and well-nourished.  HENT:  Head: Normocephalic and atraumatic.  Neck: Normal range of motion.  Cardiovascular: Normal rate.   Pulmonary/Chest: Effort normal. No accessory muscle usage. No tachypnea. No respiratory distress. He has decreased breath sounds. He has wheezes. He has no rhonchi. He has no rales.  Abdominal: Soft. He exhibits no distension. There is no tenderness. There is no rebound.  Musculoskeletal: Normal range of motion. He exhibits no edema or tenderness.  Neurological: He is alert and oriented to person, place, and time. No cranial nerve deficit.  Skin: Skin is warm and dry. No rash noted.  Nursing note and vitals reviewed.   ED Course  Procedures (including critical care time) Labs Review Labs Reviewed  BASIC METABOLIC PANEL  CBC  TROPONIN I  TROPONIN I  I-STAT TROPOININ, ED    Imaging  Review Dg Chest 2 View  09/29/2015  CLINICAL DATA:  Chest pain, intermittent for the past 4 months. History of throat carcinoma EXAM: CHEST  2 VIEW COMPARISON:  Apr 04, 2013 FINDINGS: There are probable nipple shadows bilaterally. No edema or consolidation. Heart size and pulmonary vascularity are normal. No adenopathy. No bone lesions. IMPRESSION: Probable nipple shadows bilaterally ; advise repeat study with nipple markers to confirm. No edema or consolidation. No adenopathy. Electronically Signed   By: Lowella Grip III M.D.   On: 09/29/2015 09:34   I have personally reviewed and evaluated these images and lab results as part of my medical decision-making.   EKG Interpretation   Date/Time:  Thursday September 29 2015 08:47:22 EST Ventricular Rate:  65 PR Interval:  140 QRS Duration: 72 QT Interval:  422 QTC Calculation: 438 R Axis:   -26 Text Interpretation:  Normal sinus rhythm Nonspecific ST and T wave  abnormality Abnormal ECG Flattening t waves in inferior leads similar to  2004, no other acute changes since 03/2013 Confirmed by [facility] MD, Corene Cornea  [phone]) on 09/29/2015 8:53:09 AM      MDM   Final diagnoses:  COPD exacerbation ([place])   69 year old male here with likely COPD exacerbation. Doubt cardiac causes however we'll do a delta troponins secondary to his age and medical history. I will also given and Biaxin as had increase in cough and sputum production and change in color. We'll reevaluate for appropriateness of discharge on antibiotics and inhaler.  1126: improved symptoms. Improved air movement after treatment. Still think likely COPD exacerbation, will check 2nd troponin.   2nd troponin negative. Still improved aereation and no chest discomfort. Doubt ACS, likely COPD/bronchitis. Albuterol inhaler provided, will fu w/ primary provider.       Merrily Pew, MD 09/30/15 [phone]

## 2015-09-29 NOTE — Discharge Instructions (Signed)
°Emergency Department Resource Guide °1) Find a Doctor and Pay Out of Pocket °Although you won't have to find out who is covered by your insurance plan, it is a good idea to ask around and get recommendations. You will then need to call the office and see if the doctor you have chosen will accept you as a new patient and what types of options they offer for patients who are self-pay. Some doctors offer discounts or will set up payment plans for their patients who do not have insurance, but you will need to ask so you aren't surprised when you get to your appointment. ° °2) Contact Your Local Health Department °Not all health departments have doctors that can see patients for sick visits, but many do, so it is worth a call to see if yours does. If you don't know where your local health department is, you can check in your phone book. The CDC also has a tool to help you locate your state's health department, and many state websites also have listings of all of their local health departments. ° °3) Find a Walk-in Clinic °If your illness is not likely to be very severe or complicated, you may want to try a walk in clinic. These are popping up all over the country in pharmacies, drugstores, and shopping centers. They're usually staffed by nurse practitioners or physician assistants that have been trained to treat common illnesses and complaints. They're usually fairly quick and inexpensive. However, if you have serious medical issues or chronic medical problems, these are probably not your best option. ° °No Primary Care Doctor: °- Call [facility] at  832-8000 - they can help you locate a primary care doctor that  accepts your insurance, provides certain services, etc. °- Physician Referral Service- 1-800-533-3463 ° °Chronic Pain Problems: °Organization         Address  Phone   Notes  °[facility]  (336) 297-2271 Patients need to be referred by their primary care doctor.  ° °Medication  Assistance: °Organization         Address  Phone   Notes  °[place] [facility] [address] °[place], [place] [address] (336) 641-8030 --Must be a resident of [place] °-- Must have NO insurance coverage whatsoever (no Medicaid/ Medicare, etc.) °-- The pt. MUST have a primary care doctor that directs their care regularly and follows them in the community °  °[facility]  (866) 331-1348   °[employer]  (888) 892-1162   ° °Agencies that provide inexpensive medical care: °Organization         Address  Phone   Notes  °[facility]  (336) 832-8035   °[facility]    (336) 832-7272   °[facility] [address] °[place], [place] [address] (336) 832-4777   °[facility] [address], °[place] (336) 271-4999   °[facility]    (336) 373-0678   °[facility]    (336) 272-1050   °[facility] ° [address], [place] Phone:  (336) 832-4444, Fax:  (336) 832-4440 Hours of Operation:  9 am - 6 pm, M-F.  Also accepts Medicaid/Medicare and self-pay.  °[facility] ° [address], [place] Phone: (336) 832-3150, Fax: (336) 832-3151. Hours of Operation:  8:30 am - 5:30 pm, M-F.  Also accepts Medicaid and self-pay.  °[facility] [place] [address], [place] Phone: (336) 878-6027   °[facility] [address], [place], [place] (336)723-1848, Ext. 123 Mondays & Thursdays: 7-9 AM.  First 15 patients are seen on a first come, first serve basis. °  ° °Medicaid-accepting [place] Providers: ° °Organization         Address  Phone   Notes  °[facility] [address], [place] (336) 641-2100 Also accepts self-pay patients.  °[facility] [address], [place] ° (336) 856-9996   °[facility] [address], [place]  (336) 288-8857   °[facility] [address] [place] [address], [place] (336) 299-7000   °Veita Bland [address], [place]  ° (336) 373-1557 Only accepts [place] Access Medicaid patients after they have their name applied to their card.  ° °Self-Pay (no insurance) in [place]: ° °Organization         Address  Phone   Notes  °Sickle Cell Patients, [facility] [address], [place] (336) 832-1970   °[facility] [address], [place] (336) 832-4400   °[facility] ° [address], [place] (336) 992-4800   °[facility]/Dr. Osei-Bonsu ° [address] [place] [address], [place] or [address], [place] (336) 841-8500 Phone number for both [place] and [place] locations is the same.  °[facility] [address], [place] (336) 299-0000   °[facility] [address] [place] [address], [place] or [address] (336) 852-7530 °(336) 878-2260   °[facility] [address], [place] (336) 350-1642, phone; (336) 294-5005, fax Sees patients 1st and 3rd Saturday of every month.  Must not qualify for public or private insurance (i.e. Medicaid, Medicare, [place] Health Choice, Veterans' Benefits) • Household income should be no more than 200% of the poverty level •The clinic cannot treat you if you are pregnant or think you are pregnant • Sexually transmitted diseases are not treated at the clinic.  ° ° °Dental Care: °Organization         Address  Phone  Notes  °[place] [facility] [address], [place] (336) 641-6152 Accepts children up to age 21 who are enrolled in Medicaid or [place] Health Choice; pregnant women with a Medicaid card; and children who have applied for Medicaid or [place] Health Choice, but were declined, whose parents can pay a reduced fee at time of service.  °[place]  [facility] [place]  [address], [place] (336) 641-7733 Accepts children up to age 21 who are enrolled in Medicaid or [place] Health Choice; pregnant women with a Medicaid card; and children who have applied for Medicaid or [place] Health Choice, but were declined, whose parents can pay a reduced fee at time of service.  °[facility] ° [address], [place] (336) 641-4533 Patients are seen by appointment only. Walk-ins are not accepted. [facility] will see patients 18 years of age and older. °Monday - Tuesday (8am-5pm) °Most Wednesdays (8:30-5pm) °$30 per visit, cash only  °[facility] ° [address], [place] (336) 641-4533 Patients are seen by appointment only. Walk-ins are not accepted. [facility] will see patients 18 years of age and older. °One   Wednesday Evening (Monthly: Volunteer Based).  $30 per visit, cash only  °[facility]  (919) 537-3737 for adults; Children under age 4, call Graduate Pediatric Dentistry at (919) [phone]. Children aged 4-14, please call (919) [phone] to request a pediatric application. ° Dental services are provided in all areas of dental care including fillings, crowns and bridges, complete and partial dentures, implants, gum treatment, root canals, and extractions. Preventive care is also provided. Treatment is provided to both adults and children. °Patients are selected via a lottery and there is often a waiting list. °  °[facility] [address], °[place] ° (336) 763-8833 [email or web address] °  °[facility] [address], [place], [place] (336)723-1848, Ext. 123 Second and Fourth Thursday of each month, opens at 6:30 AM; Clinic ends at 9 AM.  Patients are seen on a first-come first-served basis, and a limited number are seen during each clinic.  ° °[facility] ° [address], [place], [place] (336) 723-7904    Eligibility Requirements °You must have lived in [place], or [place] counties for at least the last three months. °  You cannot be eligible for state or federal sponsored healthcare insurance, including Veterans Administration, Medicaid, or Medicare. °  You generally cannot be eligible for healthcare insurance through your employer.  °  How to apply: °Eligibility screenings are held every Tuesday and Wednesday afternoon from 1:00 pm until 4:00 pm. You do not need an appointment for the interview!  °[facility] [address], [place], [place] 336-631-2330   °[facility] [facility]  336-342-8273   °[facility]  336-703-3100   °[facility]  336-570-6415   ° °Behavioral Health Resources in the Community: °Intensive Outpatient Programs °Organization         Address  Phone  Notes  °[facility] [address], [place], [place] 336-878-6098   °[facility] [address], [place], [place] 336-832-9800   °[facility] [address], [place], [place] ° 336-882-2125   °[facility] [address],  °[place], [place] 1-800-853-5163 or 336-641-4981   °Substance Abuse Resources °Organization         Address  Phone  Notes  °[facility]  336-882-2125   °[facility]  336-784-9470   °[facility]  336-285-9073   °[facility]  336-845-3988   °Residential & Outpatient Substance Abuse Program  1-800-659-3381   °Psychological Services °Organization         Address  Phone  Notes  °[facility]  336- 832-9600   °[facility]  336- 378-7881   °[facility] [address], [place] 1-800-853-5163 or 336-641-4981   ° °Mobile Crisis Teams °Organization         Address  Phone  Notes  °[facility], Mobile Crisis Care Unit  1-877-626-1772   °Assertive °[facility] ° [address]  [place], [place] 336-834-9664   °Sharon DeEsch [address] °[place] 336-554-5454   ° °Self-Help/Support Groups °Organization         Address  Phone             Notes  °[facility]  - variety of support groups  336- 373-1402 Call for more information  °Narcotics Anonymous (NA), [facility] [address], °[place]  2 meetings at this location  ° °  Residential Treatment Programs °Organization         Address  Phone  Notes  °[facility] [address],    °[place]  1-866-801-8205   °[facility] ° [address], [place], [place] 704-293-8524   °[facility] [address], [place] 336-845-3988 Admissions: 8am-3pm M-F  °[facility] [address]    °[place], [place] 336-841-1104   °[facility] [address], [place], [place] 336-379-7146   °[facility] [address]  °[place], [place] 336-285-9073   °[facility] Intensive Outpatient [address], [place], [place] 336-852-3033   °[facility] [address]  °[place], [place] 1-877-615-2722 or 336-784-9470   °[facility] ([facility] [address] [place], [place] 336-227-7417 Accepts Medicaid  °[facility] [address]  ° [place] 1-800-659-3381 Substance Abuse/Addiction Treatment  ° °[place] Behavioral Health Resources °Organization         Address  Phone  Notes  °[facility]  (888) 581-9988   °Julie Brannon, PhD [address] [place], [place]   (336) 349-5553 or (336) 951-0000   °[facility]   [address] °[place], [place] (336) 349-4454   °[facility] [address], [place], [place] (336) 342-8316 Insurance/Medicaid/sponsorship through [facility]  °[facility] [address]                                    [place], [place] (336) 342-8316 Therapy/tele-psych/case    °[facility] [address]  ° [place], [place] (336) 349-2233    °Dr. Arfeen  (336) 349-4544   °[facility] [place]  [employer] [place] [facility] 1) [address], [place] °2) [address], [place] °3)  [address], [place] (336) 349-3220 °(336) 342-7768 ° °(336) 342-8140   °[place] Child Abuse [facility] (336) 342-1394 or (336) 342-3537 (After Hours)    ° ° °

## 2015-09-29 NOTE — ED Notes (Signed)
Pt reports SOB/chest pain since last night but worsened this morning.

## 2015-09-29 NOTE — ED Notes (Signed)
Patient transported to X-ray 

## 2015-10-04 ENCOUNTER — Inpatient Hospital Stay (HOSPITAL_COMMUNITY)
Admission: EM | Admit: 2015-10-04 | Discharge: 2015-10-07 | DRG: 246 | Disposition: A | Discharge status: Home / Self Care | Payer: Medicare Other | Attending: Cardiovascular Disease | Admitting: Cardiovascular Disease

## 2015-10-04 ENCOUNTER — Encounter (HOSPITAL_COMMUNITY)
Admission: EM | Disposition: A | Discharge status: Home / Self Care | Payer: Self-pay | Source: Home / Self Care | Attending: Cardiovascular Disease

## 2015-10-04 ENCOUNTER — Inpatient Hospital Stay (HOSPITAL_COMMUNITY): Payer: Medicare Other

## 2015-10-04 ENCOUNTER — Emergency Department (HOSPITAL_COMMUNITY): Payer: Medicare Other

## 2015-10-04 ENCOUNTER — Encounter (HOSPITAL_COMMUNITY): Payer: Self-pay | Admitting: Emergency Medicine

## 2015-10-04 DIAGNOSIS — J9811 Atelectasis: Secondary | ICD-10-CM | POA: Diagnosis not present

## 2015-10-04 DIAGNOSIS — Z8619 Personal history of other infectious and parasitic diseases: Secondary | ICD-10-CM

## 2015-10-04 DIAGNOSIS — E785 Hyperlipidemia, unspecified: Secondary | ICD-10-CM | POA: Diagnosis present

## 2015-10-04 DIAGNOSIS — I2510 Atherosclerotic heart disease of native coronary artery without angina pectoris: Secondary | ICD-10-CM | POA: Diagnosis present

## 2015-10-04 DIAGNOSIS — I472 Ventricular tachycardia: Secondary | ICD-10-CM | POA: Diagnosis not present

## 2015-10-04 DIAGNOSIS — J449 Chronic obstructive pulmonary disease, unspecified: Secondary | ICD-10-CM | POA: Diagnosis present

## 2015-10-04 DIAGNOSIS — Z923 Personal history of irradiation: Secondary | ICD-10-CM

## 2015-10-04 DIAGNOSIS — T45515A Adverse effect of anticoagulants, initial encounter: Secondary | ICD-10-CM | POA: Diagnosis not present

## 2015-10-04 DIAGNOSIS — I2111 ST elevation (STEMI) myocardial infarction involving right coronary artery: Secondary | ICD-10-CM

## 2015-10-04 DIAGNOSIS — R042 Hemoptysis: Secondary | ICD-10-CM | POA: Diagnosis not present

## 2015-10-04 DIAGNOSIS — Z87891 Personal history of nicotine dependence: Secondary | ICD-10-CM

## 2015-10-04 DIAGNOSIS — E872 Acidosis: Secondary | ICD-10-CM | POA: Diagnosis present

## 2015-10-04 DIAGNOSIS — E875 Hyperkalemia: Secondary | ICD-10-CM | POA: Diagnosis not present

## 2015-10-04 DIAGNOSIS — N179 Acute kidney failure, unspecified: Secondary | ICD-10-CM | POA: Diagnosis present

## 2015-10-04 DIAGNOSIS — Z23 Encounter for immunization: Secondary | ICD-10-CM

## 2015-10-04 DIAGNOSIS — I213 ST elevation (STEMI) myocardial infarction of unspecified site: Secondary | ICD-10-CM

## 2015-10-04 DIAGNOSIS — E861 Hypovolemia: Secondary | ICD-10-CM | POA: Diagnosis not present

## 2015-10-04 DIAGNOSIS — R072 Precordial pain: Secondary | ICD-10-CM | POA: Diagnosis present

## 2015-10-04 DIAGNOSIS — R570 Cardiogenic shock: Secondary | ICD-10-CM | POA: Diagnosis not present

## 2015-10-04 DIAGNOSIS — Z8521 Personal history of malignant neoplasm of larynx: Secondary | ICD-10-CM | POA: Diagnosis not present

## 2015-10-04 DIAGNOSIS — C329 Malignant neoplasm of larynx, unspecified: Secondary | ICD-10-CM | POA: Insufficient documentation

## 2015-10-04 DIAGNOSIS — I2119 ST elevation (STEMI) myocardial infarction involving other coronary artery of inferior wall: Principal | ICD-10-CM | POA: Diagnosis present

## 2015-10-04 DIAGNOSIS — Z955 Presence of coronary angioplasty implant and graft: Secondary | ICD-10-CM

## 2015-10-04 HISTORY — PX: CARDIAC CATHETERIZATION: SHX172

## 2015-10-04 HISTORY — DX: ST elevation (STEMI) myocardial infarction involving right coronary artery: I21.11

## 2015-10-04 LAB — BASIC METABOLIC PANEL
Anion gap: 11 (ref 5–15)
BUN: 19 mg/dL (ref 6–20)
CHLORIDE: 102 mmol/L (ref 101–111)
CO2: 22 mmol/L (ref 22–32)
CREATININE: 1.29 mg/dL — AB (ref 0.61–1.24)
Calcium: 8.3 mg/dL — ABNORMAL LOW (ref 8.9–10.3)
GFR calc Af Amer: >60 mL/min (ref >60)
GFR calc non Af Amer: 55 mL/min — ABNORMAL LOW (ref >60)
GLUCOSE: 140 mg/dL — AB (ref 65–99)
Potassium: 3.1 mmol/L — ABNORMAL LOW (ref 3.5–5.1)
Sodium: 135 mmol/L (ref 135–145)

## 2015-10-04 LAB — COMPREHENSIVE METABOLIC PANEL
ALK PHOS: 65 U/L (ref 38–126)
ALT: 21 U/L (ref 17–63)
ANION GAP: 12 (ref 5–15)
AST: 29 U/L (ref 15–41)
Albumin: 3.9 g/dL (ref 3.5–5.0)
BILIRUBIN TOTAL: 0.9 mg/dL (ref 0.3–1.2)
BUN: 22 mg/dL — ABNORMAL HIGH (ref 6–20)
CALCIUM: 9.1 mg/dL (ref 8.9–10.3)
CO2: 21 mmol/L — ABNORMAL LOW (ref 22–32)
Chloride: 102 mmol/L (ref 101–111)
Creatinine, Ser: 1.35 mg/dL — ABNORMAL HIGH (ref 0.61–1.24)
GFR, EST NON AFRICAN AMERICAN: 52 mL/min — AB (ref >60)
Glucose, Bld: 160 mg/dL — ABNORMAL HIGH (ref 65–99)
Potassium: 3.4 mmol/L — ABNORMAL LOW (ref 3.5–5.1)
Sodium: 135 mmol/L (ref 135–145)
TOTAL PROTEIN: 6.7 g/dL (ref 6.5–8.1)

## 2015-10-04 LAB — POCT I-STAT, CHEM 8
BUN: 20 mg/dL (ref 6–20)
CALCIUM ION: 1.04 mmol/L — AB (ref 1.13–1.30)
Chloride: 83 mmol/L — ABNORMAL LOW (ref 101–111)
Creatinine, Ser: 0.7 mg/dL (ref 0.61–1.24)
GLUCOSE: 141 mg/dL — AB (ref 65–99)
HCT: 41 % (ref 39.0–52.0)
Hemoglobin: 13.9 g/dL (ref 13.0–17.0)
Potassium: 2.5 mmol/L — CL (ref 3.5–5.1)
SODIUM: 119 mmol/L — AB (ref 135–145)
TCO2: 19 mmol/L (ref 0–100)

## 2015-10-04 LAB — CBC WITH DIFFERENTIAL/PLATELET
BASOS PCT: 0 %
Basophils Absolute: 0 10*3/uL (ref 0.0–0.1)
EOS ABS: 0.2 10*3/uL (ref 0.0–0.7)
Eosinophils Relative: 1 %
HEMATOCRIT: 45.2 % (ref 39.0–52.0)
HEMOGLOBIN: 15.3 g/dL (ref 13.0–17.0)
LYMPHS PCT: 31 %
Lymphs Abs: 5 10*3/uL — ABNORMAL HIGH (ref 0.7–4.0)
MCH: 30.1 pg (ref 26.0–34.0)
MCHC: 33.8 g/dL (ref 30.0–36.0)
MCV: 89 fL (ref 78.0–100.0)
Monocytes Absolute: 1.6 10*3/uL — ABNORMAL HIGH (ref 0.1–1.0)
Monocytes Relative: 10 %
NEUTROS ABS: 9.4 10*3/uL — AB (ref 1.7–7.7)
Neutrophils Relative %: 58 %
Platelets: 252 10*3/uL (ref 150–400)
RBC: 5.08 MIL/uL (ref 4.22–5.81)
RDW: 12.6 % (ref 11.5–15.5)
WBC: 16.2 10*3/uL — ABNORMAL HIGH (ref 4.0–10.5)

## 2015-10-04 LAB — POCT I-STAT TROPONIN I: Troponin i, poc: 0.06 ng/mL (ref 0.00–0.08)

## 2015-10-04 LAB — TROPONIN I: TROPONIN I: 20.08 ng/mL — AB (ref <0.031)

## 2015-10-04 LAB — MAGNESIUM: MAGNESIUM: 1.8 mg/dL (ref 1.7–2.4)

## 2015-10-04 LAB — MRSA PCR SCREENING: MRSA by PCR: NEGATIVE

## 2015-10-04 LAB — POCT ACTIVATED CLOTTING TIME: ACTIVATED CLOTTING TIME: 528 s

## 2015-10-04 SURGERY — LEFT HEART CATH AND CORONARY ANGIOGRAPHY
Anesthesia: LOCAL

## 2015-10-04 MED ORDER — SODIUM CHLORIDE 0.9 % IV SOLN
250.0000 mg | INTRAVENOUS | Status: DC | PRN
  Administered 2015-10-04 (×2): 1.75 mg/kg/h via INTRAVENOUS

## 2015-10-04 MED ORDER — ATROPINE SULFATE 0.1 MG/ML IJ SOLN
INTRAMUSCULAR | Status: AC
  Filled 2015-10-04: qty 10

## 2015-10-04 MED ORDER — NITROGLYCERIN 0.4 MG SL SUBL: 0.4000 mg | SUBLINGUAL_TABLET | SUBLINGUAL | Status: DC | PRN

## 2015-10-04 MED ORDER — DOPAMINE-DEXTROSE 3.2-5 MG/ML-% IV SOLN
INTRAVENOUS | Status: AC
  Filled 2015-10-04: qty 250

## 2015-10-04 MED ORDER — DIAZEPAM 5 MG PO TABS: 5.0000 mg | ORAL_TABLET | Freq: Four times a day (QID) | ORAL | Status: DC | PRN

## 2015-10-04 MED ORDER — SODIUM CHLORIDE 0.9 % IV SOLN
1.7500 mg/kg/h | INTRAVENOUS | Status: AC
  Administered 2015-10-04: 1.75 mg/kg/h via INTRAVENOUS
  Filled 2015-10-04: qty 250

## 2015-10-04 MED ORDER — CARVEDILOL 3.125 MG PO TABS
3.1250 mg | ORAL_TABLET | Freq: Two times a day (BID) | ORAL | Status: DC
  Filled 2015-10-04: qty 1

## 2015-10-04 MED ORDER — ATORVASTATIN CALCIUM 80 MG PO TABS
80.0000 mg | ORAL_TABLET | Freq: Every day | ORAL | Status: DC
  Administered 2015-10-05 – 2015-10-06 (×2): 80 mg via ORAL
  Filled 2015-10-04 (×3): qty 1

## 2015-10-04 MED ORDER — HEPARIN SODIUM (PORCINE) 1000 UNIT/ML IJ SOLN
INTRAMUSCULAR | Status: AC
  Filled 2015-10-04: qty 1

## 2015-10-04 MED ORDER — MORPHINE SULFATE (PF) 2 MG/ML IV SOLN
2.0000 mg | INTRAVENOUS | Status: DC | PRN
  Administered 2015-10-04: 2 mg via INTRAVENOUS
  Filled 2015-10-04: qty 1

## 2015-10-04 MED ORDER — IOHEXOL 350 MG/ML SOLN
INTRAVENOUS | Status: DC | PRN
  Administered 2015-10-04: 175 mL via INTRAVENOUS

## 2015-10-04 MED ORDER — ATROPINE SULFATE 0.1 MG/ML IJ SOLN
INTRAMUSCULAR | Status: DC | PRN
  Administered 2015-10-04: 1 mg via INTRAVENOUS

## 2015-10-04 MED ORDER — VERAPAMIL HCL 2.5 MG/ML IV SOLN
INTRAVENOUS | Status: AC
  Filled 2015-10-04: qty 2

## 2015-10-04 MED ORDER — BIVALIRUDIN 250 MG IV SOLR
INTRAVENOUS | Status: AC
  Filled 2015-10-04: qty 250

## 2015-10-04 MED ORDER — SODIUM CHLORIDE 0.9 % IJ SOLN: 3.0000 mL | INTRAMUSCULAR | Status: DC | PRN

## 2015-10-04 MED ORDER — TICAGRELOR 90 MG PO TABS
ORAL_TABLET | ORAL | Status: DC | PRN
  Administered 2015-10-04: 180 mg via ORAL

## 2015-10-04 MED ORDER — MIDAZOLAM HCL 2 MG/2ML IJ SOLN
INTRAMUSCULAR | Status: DC | PRN
  Administered 2015-10-04: 2 mg via INTRAVENOUS

## 2015-10-04 MED ORDER — FENTANYL CITRATE (PF) 100 MCG/2ML IJ SOLN
INTRAMUSCULAR | Status: DC | PRN
  Administered 2015-10-04: 50 ug via INTRAVENOUS
  Administered 2015-10-04: 25 ug via INTRAVENOUS

## 2015-10-04 MED ORDER — HEPARIN (PORCINE) IN NACL 100-0.45 UNIT/ML-% IJ SOLN
950.0000 [IU]/h | INTRAMUSCULAR | Status: DC
  Filled 2015-10-04: qty 250

## 2015-10-04 MED ORDER — TICAGRELOR 90 MG PO TABS
ORAL_TABLET | ORAL | Status: AC
  Filled 2015-10-04: qty 2

## 2015-10-04 MED ORDER — ASPIRIN 325 MG PO TABS: 325.0000 mg | ORAL_TABLET | Freq: Once | ORAL | Status: DC

## 2015-10-04 MED ORDER — ONDANSETRON HCL 4 MG/2ML IJ SOLN: 4.0000 mg | Freq: Four times a day (QID) | INTRAMUSCULAR | Status: DC | PRN

## 2015-10-04 MED ORDER — ASPIRIN 81 MG PO CHEW
324.0000 mg | CHEWABLE_TABLET | Freq: Once | ORAL | Status: AC
  Administered 2015-10-04: 324 mg via ORAL
  Filled 2015-10-04: qty 4

## 2015-10-04 MED ORDER — LIDOCAINE HCL (PF) 1 % IJ SOLN
INTRAMUSCULAR | Status: AC
  Filled 2015-10-04: qty 30

## 2015-10-04 MED ORDER — NITROGLYCERIN 1 MG/10 ML FOR IR/CATH LAB
INTRA_ARTERIAL | Status: DC | PRN
  Administered 2015-10-04: 18:00:00

## 2015-10-04 MED ORDER — HEPARIN (PORCINE) IN NACL 2-0.9 UNIT/ML-% IJ SOLN
INTRAMUSCULAR | Status: AC
  Filled 2015-10-04: qty 1500

## 2015-10-04 MED ORDER — SODIUM CHLORIDE 0.9 % IV SOLN
INTRAVENOUS | Status: DC
  Administered 2015-10-04: 22:00:00 via INTRAVENOUS

## 2015-10-04 MED ORDER — SODIUM CHLORIDE 0.9 % IV BOLUS (SEPSIS): 1000.0000 mL | Freq: Once | INTRAVENOUS | Status: DC

## 2015-10-04 MED ORDER — SODIUM CHLORIDE 0.9 % IJ SOLN
3.0000 mL | Freq: Two times a day (BID) | INTRAMUSCULAR | Status: DC
  Administered 2015-10-04 – 2015-10-07 (×5): 3 mL via INTRAVENOUS

## 2015-10-04 MED ORDER — NITROGLYCERIN 1 MG/10 ML FOR IR/CATH LAB
INTRA_ARTERIAL | Status: AC
  Filled 2015-10-04: qty 10

## 2015-10-04 MED ORDER — HEPARIN SODIUM (PORCINE) 5000 UNIT/ML IJ SOLN
INTRAMUSCULAR | Status: AC
  Filled 2015-10-04: qty 1

## 2015-10-04 MED ORDER — HEPARIN BOLUS VIA INFUSION
4000.0000 [IU] | Freq: Once | INTRAVENOUS | Status: DC
  Filled 2015-10-04: qty 4000

## 2015-10-04 MED ORDER — POTASSIUM CHLORIDE CRYS ER 20 MEQ PO TBCR
40.0000 meq | EXTENDED_RELEASE_TABLET | Freq: Once | ORAL | Status: AC
  Administered 2015-10-04: 40 meq via ORAL
  Filled 2015-10-04: qty 2

## 2015-10-04 MED ORDER — POTASSIUM CHLORIDE 20 MEQ/15ML (10%) PO SOLN
80.0000 meq | Freq: Once | ORAL | Status: AC
  Administered 2015-10-04: 80 meq via ORAL
  Filled 2015-10-04: qty 60

## 2015-10-04 MED ORDER — HEPARIN SODIUM (PORCINE) 1000 UNIT/ML IJ SOLN
INTRAMUSCULAR | Status: DC | PRN
  Administered 2015-10-04: 4000 [IU] via INTRAVENOUS

## 2015-10-04 MED ORDER — TICAGRELOR 90 MG PO TABS
90.0000 mg | ORAL_TABLET | Freq: Two times a day (BID) | ORAL | Status: DC
  Administered 2015-10-05 – 2015-10-07 (×5): 90 mg via ORAL
  Filled 2015-10-04 (×5): qty 1

## 2015-10-04 MED ORDER — BIVALIRUDIN BOLUS VIA INFUSION - CUPID
INTRAVENOUS | Status: DC | PRN
  Administered 2015-10-04: 59.55 mg via INTRAVENOUS

## 2015-10-04 MED ORDER — FENTANYL CITRATE (PF) 100 MCG/2ML IJ SOLN
INTRAMUSCULAR | Status: AC
  Filled 2015-10-04: qty 4

## 2015-10-04 MED ORDER — LIDOCAINE HCL (PF) 1 % IJ SOLN
INTRAMUSCULAR | Status: DC | PRN
  Administered 2015-10-04: 15 mL via INTRA_ARTERIAL

## 2015-10-04 MED ORDER — ASPIRIN EC 81 MG PO TBEC
81.0000 mg | DELAYED_RELEASE_TABLET | Freq: Every day | ORAL | Status: DC
  Administered 2015-10-05 – 2015-10-07 (×3): 81 mg via ORAL
  Filled 2015-10-04 (×3): qty 1

## 2015-10-04 MED ORDER — SODIUM CHLORIDE 0.9 % IV SOLN: 250.0000 mL | INTRAVENOUS | Status: DC | PRN

## 2015-10-04 MED ORDER — MIDAZOLAM HCL 2 MG/2ML IJ SOLN
INTRAMUSCULAR | Status: AC
  Filled 2015-10-04: qty 4

## 2015-10-04 MED ORDER — ACETAMINOPHEN 325 MG PO TABS: 650.0000 mg | ORAL_TABLET | ORAL | Status: DC | PRN

## 2015-10-04 SURGICAL SUPPLY — 22 items
BALLN EUPHORA RX 2.5X12 (BALLOONS) ×2
BALLN ~~LOC~~ TREK RX 4.5X20 (BALLOONS) ×2
BALLOON EUPHORA RX 2.5X12 (BALLOONS) ×1 IMPLANT
BALLOON ~~LOC~~ TREK RX 4.5X20 (BALLOONS) ×1 IMPLANT
CATH INFINITI 5FR MULTPACK ANG (CATHETERS) ×2 IMPLANT
CATH OPTITORQUE TIG 4.0 5F (CATHETERS) ×2 IMPLANT
CATH VISTA GUIDE 6FR JR4 (CATHETERS) ×2 IMPLANT
DEVICE RAD COMP TR BAND LRG (VASCULAR PRODUCTS) ×2 IMPLANT
GLIDESHEATH SLEND A-KIT 6F 22G (SHEATH) ×2 IMPLANT
GLIDESHEATH SLEND SS 6F .021 (SHEATH) ×2 IMPLANT
KIT ENCORE 26 ADVANTAGE (KITS) ×2 IMPLANT
KIT HEART LEFT (KITS) ×2 IMPLANT
PACK CARDIAC CATHETERIZATION (CUSTOM PROCEDURE TRAY) ×2 IMPLANT
SHEATH PINNACLE 5F 10CM (SHEATH) IMPLANT
SHEATH PINNACLE 6F 10CM (SHEATH) ×2 IMPLANT
STENT SYNERGY DES 4X32 (Permanent Stent) ×2 IMPLANT
SYR MEDRAD MARK V 150ML (SYRINGE) ×2 IMPLANT
TRANSDUCER W/STOPCOCK (MISCELLANEOUS) ×2 IMPLANT
TUBING CIL FLEX 10 FLL-RA (TUBING) ×2 IMPLANT
WIRE ASAHI MEDIUM 180CM (WIRE) ×2 IMPLANT
WIRE EMERALD 3MM-J .035X150CM (WIRE) ×2 IMPLANT
WIRE SAFE-T 1.5MM-J .035X260CM (WIRE) ×2 IMPLANT

## 2015-10-04 NOTE — H&P (Signed)
Patient ID: GJON MAUS MRN: [id number] DOB/AGE: June 30, 1946 69 y.o. Admit date: 10/04/2015  Primary Care Physician:No primary care provider on file. Primary Cardiologist: None on file Active Problems:   * No active hospital problems. *  HPI: Mr. 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 He was seen in ED on 11/10 and discharged on prednisone and albuterol for an acute COPD exacerbation. Troponins were not elevated at that time x 2.  Per ED, pain worsened approximately 90 minutes to presentation today and was noted to be left substernal chest pressure without radiation. Initial EKG showed ST elevations in the inferior leads with reciprocal ST changes in avR, avL. Initial troponin negative. He was taken for emergent cath.  Past Medical History  Diagnosis Date  . Throat cancer [facility])     Past Surgical History  Procedure Laterality Date  . Appendectomy    . Throat surgery      History reviewed. No pertinent family history.  Social History   Social History  . Marital Status: Married    Spouse Name: N/A  . Number of Children: N/A  . Years of Education: N/A   Occupational History  . Not on file.   Social History Main Topics  . Smoking status: Never Smoker   . Smokeless tobacco: Not on file  . Alcohol Use: No  . Drug Use: No  . Sexual Activity: Not on file   Other Topics Concern  . Not on file   Social History Narrative     Prior to Admission medications   Medication Sig Start Date End Date Taking? Authorizing Provider  albuterol (PROVENTIL HFA;VENTOLIN HFA) 108 (90 BASE) MCG/ACT inhaler Inhale 1-2 puffs into the lungs every 4 (four) hours as needed for wheezing or shortness of breath (or coughing). 09/29/15   Merrily Pew, MD  azithromycin (ZITHROMAX) 250 MG tablet Take 1  tablet (250 mg total) by mouth daily. Take first 2 tablets together, then 1 every day until finished. 09/30/15   Merrily Pew, MD  predniSONE (DELTASONE) 20 MG tablet 3 tabs po day one, then 2 po daily x 4 days 09/30/15   Merrily Pew, MD    HOSPITAL MEDICATIONS: . heparin      . heparin  4,000 Units Intravenous Once  . sodium chloride  1,000 mL Intravenous Once   . heparin      ROS: As noted in the HPI  Physical Exam: Blood pressure 131/105, pulse 103, temperature 97.8 F (36.6 C), temperature source Oral, resp. rate 30, height 5\' 11"  (1.803 m), weight 175 lb (79.379 kg), SpO2 100 %.   General: elderly Caucasian male, resting in bed, trach collar HEENT: PERRL, EOMI, no scleral icterus, oropharynx clear, no JVD Cardiac: tachycardic, no rubs, murmurs or gallops Pulm: clear to auscultation bilaterally in the anterior lung fields Abd: soft, nontender, nondistended, BS present Ext: warm and well perfused, no pedal or tibial edema bilaterally Neuro: responds to questions appropriately; moving all extremities freely   Labs:   Lab Results  Component Value Date   WBC 16.2* 10/04/2015   HGB 13.9 10/04/2015   HCT 41.0 10/04/2015   MCV 89.0 10/04/2015   PLT 252 10/04/2015     Recent Labs Lab 10/04/15 1614 10/04/15 1722  NA 135 119*  K 3.4* 2.5*  CL  102 83*  CO2 21*  --   BUN 22* 20  CREATININE 1.35* 0.70  CALCIUM 9.1  --   PROT 6.7  --   BILITOT 0.9  --   ALKPHOS 65  --   ALT 21  --   AST 29  --   GLUCOSE 160* 141*   Lab Results  Component Value Date   CKTOTAL 180 12/30/2009   CKMB 1.9 12/30/2009   TROPONINI <0.03 09/29/2015     Radiology: Dg Chest 2 View  09/29/2015  CLINICAL DATA:  Chest pain, intermittent for the past 4 months. History of throat carcinoma EXAM: CHEST  2 VIEW COMPARISON:  Apr 04, 2013 FINDINGS: There are probable nipple shadows bilaterally. No edema or consolidation. Heart size and pulmonary vascularity are normal. No adenopathy. No bone  lesions. IMPRESSION: Probable nipple shadows bilaterally ; advise repeat study with nipple markers to confirm. No edema or consolidation. No adenopathy. Electronically Signed   By: Lowella Grip III M.D.   On: 09/29/2015 09:34   EKG: ST elevations in the inferior leads with reciprocal ST changes in avR, avL.  ASSESSMENT AND PLAN: Mr. 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 Dispo pending cath results.   Signed: Charlott Rakes 10/04/2015, 4:44 PM  Mr. Benen canal is a 69 year old gentleman who has a history of laryngeal CA and underwent laryngectomy in 2005.  He has a history of prior tobacco use with COPD.  He has experienced several weeks of intermittent chest discomfort.  He was recently seen in the emergency room and felt to have COPD exacerbation.  Today, he developed severe substernal chest pressure associated with shortness of breath.  [facility] where his ECG revealed early inferior ST elevation with significant T-wave abnormality leads 1 and L, V1 and V2.  A code STEMI was activated.  He was experiencing 9 out of 10 chest pain.  He denies any recent bleeding issues.  There is no history of prior stroke.  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  He is now taken acutely to the catheterization laboratory for emergent cardiac catheterization and probable percutaneous cardiac intervention. Troy Sine, MD, [facility] 10/04/2015

## 2015-10-04 NOTE — Progress Notes (Signed)
Paged MD Eula Fried about multiple 8-10 runs of vtach since start of shift. Pt asymptomatic with runs. Informed MD of labs of Mag 1.8, K 3.1 (with 28mEq K given at 2000), and Trop 20.08. MD ordered 80 mEq K and to recheck K and Mag with am labs. Will continue to monitor.

## 2015-10-04 NOTE — ED Provider Notes (Signed)
CSN: [id number]     Arrival date & time 10/04/15  1558 History   First MD Initiated Contact with Patient 10/04/15 1606     Chief Complaint  Patient presents with  . Chest Pain  . Shortness of Breath     (Consider location/radiation/quality/duration/timing/severity/associated sxs/prior Treatment) The history is provided by the patient.  Cory Sloan is a 69 y.o. male hx of throat cancer s/p laryngectomy here with chest pain, shortness of breath. Patient states that he has chronic chest pain and shortness of breath for about a month or so. Came in 5 days ago and had normal troponin 2, and was diagnosed with COPD was sent home with albuterol and prednisone. States that he has baseline shortness of breath but acutely got worse about an hour and a half ago. States that he has substernal chest pressure with no radiation. His shortness of breath also got worse. Denies any cardiac history. Given solumedrol, duoneb by EMS.    Past Medical History  Diagnosis Date  . Throat cancer [facility])    Past Surgical History  Procedure Laterality Date  . Appendectomy    . Throat surgery     History reviewed. No pertinent family history. Social History  Substance Use Topics  . Smoking status: Never Smoker   . Smokeless tobacco: None  . Alcohol Use: No    Review of Systems  Respiratory: Positive for shortness of breath.   Cardiovascular: Positive for chest pain.  All other systems reviewed and are negative.     Allergies  Review of patient's allergies indicates no known allergies.  Home Medications   Prior to Admission medications   Medication Sig Start Date End Date Taking? Authorizing Provider  albuterol (PROVENTIL HFA;VENTOLIN HFA) 108 (90 BASE) MCG/ACT inhaler Inhale 1-2 puffs into the lungs every 4 (four) hours as needed for wheezing or shortness of breath (or coughing). 09/29/15   Merrily Pew, MD  azithromycin (ZITHROMAX) 250 MG tablet Take 1 tablet (250 mg total) by mouth daily.  Take first 2 tablets together, then 1 every day until finished. 09/30/15   Merrily Pew, MD  predniSONE (DELTASONE) 20 MG tablet 3 tabs po day one, then 2 po daily x 4 days 09/30/15   Merrily Pew, MD   BP 118/96 mmHg  Pulse 99  Temp(Src) 97.8 F (36.6 C) (Oral)  SpO2 100% Physical Exam  Constitutional: He is oriented to person, place, and time.  Chronically ill, uncomfortable   HENT:  Head: Normocephalic.  Mouth/Throat: Oropharynx is clear and moist.  Eyes: Conjunctivae are normal. Pupils are equal, round, and reactive to light.  Neck: Normal range of motion. Neck supple.  Trach scar healing well   Cardiovascular: Normal rate, regular rhythm and normal heart sounds.   Pulmonary/Chest: Effort normal and breath sounds normal. No respiratory distress. He has no wheezes. He has no rales.  Abdominal: Soft. Bowel sounds are normal. He exhibits no distension. There is no tenderness. There is no rebound.  Musculoskeletal: Normal range of motion. He exhibits no edema or tenderness.  Neurological: He is alert and oriented to person, place, and time.  Skin: Skin is warm and dry.  Psychiatric: He has a normal mood and affect. His behavior is normal.  Nursing note and vitals reviewed.   ED Course  Procedures (including critical care time)  CRITICAL CARE Performed by: Darl Householder, Felisa Zechman   Total critical care time: 30  minutes  Critical care time was exclusive of separately billable procedures and treating other patients.  Critical care was necessary to treat or prevent imminent or life-threatening deterioration.  Critical care was time spent personally by me on the following activities: development of treatment plan with patient and/or surrogate as well as nursing, discussions with consultants, evaluation of patient's response to treatment, examination of patient, obtaining history from patient or surrogate, ordering and performing treatments and interventions, ordering and review of laboratory  studies, ordering and review of radiographic studies, pulse oximetry and re-evaluation of patient's condition.   Labs Review Labs Reviewed  CBC WITH DIFFERENTIAL/PLATELET  COMPREHENSIVE METABOLIC PANEL  I-STAT TROPOININ, ED    Imaging Review No results found. I have personally reviewed and evaluated these images and lab results as part of my medical decision-making.   EKG Interpretation None       ED ECG REPORT   Date: 10/04/2015  Rate: 100  Rhythm: sinus tachycardia  QRS Axis: normal  Intervals: normal  ST/T Wave abnormalities: ST elevations inferiorly  Conduction Disutrbances:none  Narrative Interpretation:   Old EKG Reviewed: changes noted  I have personally reviewed the EKG tracing and agree with the computerized printout as noted.   MDM   Final diagnoses:  None   Cory Sloan is a 69 y.o. male here with chest pain. Initial EKG showed possible STEMI inferior leads. R sided leads showed now RV4 elevation. Code STEMI activated. I consulted Dr. [name], who agrees. Given ASA, nitro, heparin. Patient taken to emergent cath.      Wandra Arthurs, MD 10/04/15 [phone]

## 2015-10-04 NOTE — Progress Notes (Signed)
Patient arrived via EMS complaining of chest pains.  Patient has an open stoma which looks clean and dry.  Patient was placed on 40% trach collar to help with oxygen demand.  Will continue to monitor.

## 2015-10-04 NOTE — Progress Notes (Signed)
CRITICAL VALUE ALERT  Critical value received:  Potassium 2.5   Date of notification:  10/04/15  Time of notification:  [id number]  Critical value read back:Yes.    Nurse who received alert:  Alben Deeds, RN  MD notified (1st page):  [place]  Time of first page:  1850  Responding MD:  Rosalyn Gess  Time MD responded:  1855  Ordered 40 mEq potassium and stat repeat of BMP. Will pass along to night nurse and continue to monitor.

## 2015-10-04 NOTE — ED Notes (Signed)
Pt here via EMS for CP starting today with associated SOB. Pt has had a "chest cold" and has been taking abx and prednisone for 1 week. Pt sts pain started today, Pt also reports coughing up clear sputum from mouth and stoma.

## 2015-10-04 NOTE — Progress Notes (Signed)
ANTICOAGULATION CONSULT NOTE - Initial Consult  Pharmacy Consult for heparin Indication: chest pain/ACS  No Known Allergies  Patient Measurements: Height: 5\' 11"  (180.3 cm) Weight: 175 lb (79.379 kg) (stated) IBW/kg (Calculated) : 75.3 Heparin Dosing Weight: 79 kg  Vital Signs: Temp: 97.8 F (36.6 C) (11/15 1609) Temp Source: Oral (11/15 1609) BP: 131/105 mmHg (11/15 1627) Pulse Rate: 103 (11/15 1627)  Labs: No results for input(s): HGB, HCT, PLT, APTT, LABPROT, INR, HEPARINUNFRC, CREATININE, CKTOTAL, CKMB, TROPONINI in the last 72 hours.  Estimated Creatinine Clearance: 71 mL/min (by C-G formula based on Cr of 1.06).   Medical History: Past Medical History  Diagnosis Date  . Throat cancer ([place])     Medications:  Prescriptions prior to admission  Medication Sig Dispense Refill Last Dose  . albuterol (PROVENTIL HFA;VENTOLIN HFA) 108 (90 BASE) MCG/ACT inhaler Inhale 1-2 puffs into the lungs every 4 (four) hours as needed for wheezing or shortness of breath (or coughing). 1 Inhaler 0   . azithromycin (ZITHROMAX) 250 MG tablet Take 1 tablet (250 mg total) by mouth daily. Take first 2 tablets together, then 1 every day until finished. 4 tablet 0   . predniSONE (DELTASONE) 20 MG tablet 3 tabs po day one, then 2 po daily x 4 days 8 tablet 0    Scheduled:  . heparin      . heparin  4,000 Units Intravenous Once  . sodium chloride  1,000 mL Intravenous Once   Infusions:  . heparin      Assessment: 69yo male presents with CP. Pharmacy is consulted to dose heparin for ACS/chest pain. Trop 0.06.  Goal of Therapy:  Heparin level 0.3-0.7 units/ml Monitor platelets by anticoagulation protocol: Yes   Plan:  Give 4000 units bolus x 1 Start heparin infusion at 950 units/hr Check anti-Xa level in 6 hours and daily while on heparin Continue to monitor H&H and platelets  F/u after cath  Cory Sloan. Cory Sloan, PharmD Clinical Pharmacist Pager [phone] 10/04/2015,4:48 PM

## 2015-10-05 ENCOUNTER — Encounter (HOSPITAL_COMMUNITY): Payer: Self-pay | Admitting: Cardiovascular Disease

## 2015-10-05 DIAGNOSIS — C329 Malignant neoplasm of larynx, unspecified: Secondary | ICD-10-CM | POA: Insufficient documentation

## 2015-10-05 DIAGNOSIS — N179 Acute kidney failure, unspecified: Secondary | ICD-10-CM

## 2015-10-05 DIAGNOSIS — E875 Hyperkalemia: Secondary | ICD-10-CM

## 2015-10-05 DIAGNOSIS — R570 Cardiogenic shock: Secondary | ICD-10-CM

## 2015-10-05 DIAGNOSIS — J449 Chronic obstructive pulmonary disease, unspecified: Secondary | ICD-10-CM | POA: Insufficient documentation

## 2015-10-05 DIAGNOSIS — I2111 ST elevation (STEMI) myocardial infarction involving right coronary artery: Secondary | ICD-10-CM

## 2015-10-05 LAB — LIPID PANEL
CHOL/HDL RATIO: 3.4 ratio
Cholesterol: 200 mg/dL (ref 0–200)
HDL: 58 mg/dL (ref >40)
LDL Cholesterol: 119 mg/dL — ABNORMAL HIGH (ref 0–99)
Triglycerides: 113 mg/dL (ref <150)
VLDL: 23 mg/dL (ref 0–40)

## 2015-10-05 LAB — CBC
HEMATOCRIT: 42.3 % (ref 39.0–52.0)
HEMOGLOBIN: 14 g/dL (ref 13.0–17.0)
MCH: 29.5 pg (ref 26.0–34.0)
MCHC: 33.1 g/dL (ref 30.0–36.0)
MCV: 89.2 fL (ref 78.0–100.0)
PLATELETS: 243 10*3/uL (ref 150–400)
RBC: 4.74 MIL/uL (ref 4.22–5.81)
RDW: 12.8 % (ref 11.5–15.5)
WBC: 10.1 10*3/uL (ref 4.0–10.5)

## 2015-10-05 LAB — BASIC METABOLIC PANEL
ANION GAP: 12 (ref 5–15)
Anion gap: 9 (ref 5–15)
Anion gap: 8 (ref 5–15)
Anion gap: 10 (ref 5–15)
BUN: 19 mg/dL (ref 6–20)
BUN: 18 mg/dL (ref 6–20)
BUN: 16 mg/dL (ref 6–20)
BUN: 20 mg/dL (ref 6–20)
CALCIUM: 7.8 mg/dL — AB (ref 8.9–10.3)
CALCIUM: 8.3 mg/dL — AB (ref 8.9–10.3)
CHLORIDE: 105 mmol/L (ref 101–111)
CHLORIDE: 110 mmol/L (ref 101–111)
CO2: 18 mmol/L — AB (ref 22–32)
CO2: 20 mmol/L — ABNORMAL LOW (ref 22–32)
CO2: 20 mmol/L — ABNORMAL LOW (ref 22–32)
CO2: 19 mmol/L — ABNORMAL LOW (ref 22–32)
CREATININE: 1.07 mg/dL (ref 0.61–1.24)
CREATININE: 1.06 mg/dL (ref 0.61–1.24)
CREATININE: 1.11 mg/dL (ref 0.61–1.24)
Calcium: 8.2 mg/dL — ABNORMAL LOW (ref 8.9–10.3)
Calcium: 8.3 mg/dL — ABNORMAL LOW (ref 8.9–10.3)
Chloride: 110 mmol/L (ref 101–111)
Chloride: 109 mmol/L (ref 101–111)
Creatinine, Ser: 1.31 mg/dL — ABNORMAL HIGH (ref 0.61–1.24)
GFR calc Af Amer: >60 mL/min (ref >60)
GFR calc Af Amer: >60 mL/min (ref >60)
GFR calc non Af Amer: 54 mL/min — ABNORMAL LOW (ref >60)
GFR calc non Af Amer: >60 mL/min (ref >60)
GLUCOSE: 132 mg/dL — AB (ref 65–99)
GLUCOSE: 102 mg/dL — AB (ref 65–99)
Glucose, Bld: 143 mg/dL — ABNORMAL HIGH (ref 65–99)
Glucose, Bld: 123 mg/dL — ABNORMAL HIGH (ref 65–99)
POTASSIUM: 6.6 mmol/L — AB (ref 3.5–5.1)
POTASSIUM: 5 mmol/L (ref 3.5–5.1)
POTASSIUM: 4.2 mmol/L (ref 3.5–5.1)
Potassium: 4.1 mmol/L (ref 3.5–5.1)
SODIUM: 135 mmol/L (ref 135–145)
SODIUM: 138 mmol/L (ref 135–145)
Sodium: 139 mmol/L (ref 135–145)
Sodium: 138 mmol/L (ref 135–145)

## 2015-10-05 LAB — TROPONIN I
Troponin I: >65 ng/mL (ref <0.031)
Troponin I: >65 ng/mL (ref <0.031)

## 2015-10-05 LAB — LACTIC ACID, PLASMA
LACTIC ACID, VENOUS: 4.6 mmol/L — AB (ref 0.5–2.0)
LACTIC ACID, VENOUS: 3.8 mmol/L — AB (ref 0.5–2.0)

## 2015-10-05 LAB — MAGNESIUM: Magnesium: 1.9 mg/dL (ref 1.7–2.4)

## 2015-10-05 MED ORDER — COLCHICINE 0.6 MG PO TABS
1.2000 mg | ORAL_TABLET | Freq: Once | ORAL | Status: AC
  Administered 2015-10-05: 1.2 mg via ORAL

## 2015-10-05 MED ORDER — SODIUM CHLORIDE 0.9 % IV SOLN: INTRAVENOUS | Status: DC

## 2015-10-05 MED ORDER — DOPAMINE-DEXTROSE 3.2-5 MG/ML-% IV SOLN
0.0000 ug/kg/min | INTRAVENOUS | Status: DC
  Administered 2015-10-05: 2 ug/kg/min via INTRAVENOUS

## 2015-10-05 MED ORDER — SODIUM BICARBONATE 8.4 % IV SOLN
INTRAVENOUS | Status: AC
  Filled 2015-10-05: qty 50

## 2015-10-05 MED ORDER — SODIUM BICARBONATE 8.4 % IV SOLN
50.0000 meq | Freq: Once | INTRAVENOUS | Status: AC
  Administered 2015-10-05: 50 meq via INTRAVENOUS

## 2015-10-05 MED ORDER — SODIUM CHLORIDE 0.9 % IV BOLUS (SEPSIS)
1000.0000 mL | Freq: Once | INTRAVENOUS | Status: AC
Start: 2015-10-05 — End: 2015-10-05
  Administered 2015-10-05: 1000 mL via INTRAVENOUS

## 2015-10-05 MED ORDER — PNEUMOCOCCAL VAC POLYVALENT 25 MCG/0.5ML IJ INJ
0.5000 mL | INJECTION | INTRAMUSCULAR | Status: AC
  Administered 2015-10-06: 0.5 mL via INTRAMUSCULAR
  Filled 2015-10-05: qty 0.5

## 2015-10-05 MED ORDER — VANCOMYCIN HCL IN DEXTROSE 1-5 GM/200ML-% IV SOLN: 1000.0000 mg | INTRAVENOUS | Status: DC

## 2015-10-05 MED ORDER — SODIUM POLYSTYRENE SULFONATE 15 GM/60ML PO SUSP
15.0000 g | Freq: Once | ORAL | Status: AC
  Administered 2015-10-05: 15 g via ORAL

## 2015-10-05 MED ORDER — GUAIFENESIN-DM 100-10 MG/5ML PO SYRP
10.0000 mL | ORAL_SOLUTION | ORAL | Status: DC | PRN
  Administered 2015-10-05 – 2015-10-06 (×2): 10 mL via ORAL
  Filled 2015-10-05 (×3): qty 10

## 2015-10-05 MED ORDER — SODIUM CHLORIDE 0.9 % IV BOLUS (SEPSIS)
1000.0000 mL | Freq: Once | INTRAVENOUS | Status: AC
  Administered 2015-10-05: 1000 mL via INTRAVENOUS

## 2015-10-05 MED ORDER — SODIUM CHLORIDE 0.9 % IV BOLUS (SEPSIS)
500.0000 mL | Freq: Once | INTRAVENOUS | Status: AC
  Administered 2015-10-05: 500 mL via INTRAVENOUS

## 2015-10-05 MED ORDER — HEPARIN SODIUM (PORCINE) 5000 UNIT/ML IJ SOLN
5000.0000 [IU] | Freq: Three times a day (TID) | INTRAMUSCULAR | Status: DC
  Administered 2015-10-05 – 2015-10-06 (×3): 5000 [IU] via SUBCUTANEOUS
  Filled 2015-10-05 (×3): qty 1

## 2015-10-05 MED ORDER — COLCHICINE 0.6 MG PO TABS
ORAL_TABLET | ORAL | Status: AC
  Filled 2015-10-05: qty 1

## 2015-10-05 MED ORDER — SODIUM POLYSTYRENE SULFONATE 15 GM/60ML PO SUSP
ORAL | Status: AC
  Filled 2015-10-05: qty 60

## 2015-10-05 MED ORDER — PIPERACILLIN-TAZOBACTAM 3.375 G IVPB
3.3750 g | Freq: Three times a day (TID) | INTRAVENOUS | Status: DC
  Filled 2015-10-05 (×2): qty 50

## 2015-10-05 MED ORDER — INFLUENZA VAC SPLIT QUAD 0.5 ML IM SUSY
0.5000 mL | PREFILLED_SYRINGE | INTRAMUSCULAR | Status: AC
  Administered 2015-10-06: 0.5 mL via INTRAMUSCULAR
  Filled 2015-10-05: qty 0.5

## 2015-10-05 MED ORDER — DOPAMINE-DEXTROSE 3.2-5 MG/ML-% IV SOLN
INTRAVENOUS | Status: AC
  Filled 2015-10-05: qty 250

## 2015-10-05 MED ORDER — VANCOMYCIN HCL 10 G IV SOLR
1250.0000 mg | Freq: Once | INTRAVENOUS | Status: DC
  Filled 2015-10-05: qty 1250

## 2015-10-05 MED ORDER — SODIUM CHLORIDE 0.9 % IV SOLN
1.0000 g | Freq: Once | INTRAVENOUS | Status: AC
  Administered 2015-10-05: 1 g via INTRAVENOUS
  Filled 2015-10-05: qty 10

## 2015-10-05 NOTE — Care Management Note (Signed)
Case Management Note  Patient Details  Name: Cory Sloan MRN: [id number] Date of Birth: [birth year]  Subjective/Objective:     Adm w mi               Action/Plan: lives w wife, has existing trach   Expected Discharge Date:                  Expected Discharge Plan:     In-House Referral:     Discharge planning Services  CM Consult, Medication Assistance  Post Acute Care Choice:    Choice offered to:     DME Arranged:    DME Agency:     HH Arranged:    [place] Agency:     Status of Service:     Medicare Important Message Given:    Date Medicare IM Given:    Medicare IM give by:    Date Additional Medicare IM Given:    Additional Medicare Important Message give by:     If discussed at [place] of Stay Meetings, dates discussed:    Additional Comments: ur review, gave pt 30day free brilinta card.  Lacretia Leigh, RN 10/05/2015, 10:42 AM

## 2015-10-05 NOTE — Progress Notes (Signed)
Subjective:  PCCM consulted for shock and started on dopamine gtt this AM. Reports chest tightness this AM though symptoms improved from admission. No dyspnea.  Objective:  Vital Signs in the last 24 hours: Temp:  [97.8 F (36.6 C)-98.4 F (36.9 C)] 98.2 F (36.8 C) (11/16 0740) Pulse Rate:  [0-204] 91 (11/16 0900) Resp:  [0-103] 17 (11/16 0900) BP: (74-142)/(58-105) 103/71 mmHg (11/16 0900) SpO2:  [0 %-100 %] 95 % (11/16 0900) FiO2 (%):  [100 %] 100 % (11/15 1820) Weight:  [160 lb 4.4 oz (72.7 kg)-175 lb (79.379 kg)] 160 lb 4.4 oz (72.7 kg) (11/15 1820)  Intake/Output from previous day: 11/15 0701 - 11/16 0700 In: 5099.3 [I.V.:2989.3; IV Piggyback:2110] Out: 1075 [Urine:1075]    Physical Exam: General: elderly Caucasian male, resting in bed, trach collar in place HEENT: PERRL, EOMI, no scleral icterus, oropharynx clear Cardiac: RRR, no rubs, murmurs or gallops Pulm: clear to auscultation bilaterally in the anterior lung fields, no wheezes, rales, or rhonchi Abd: soft, nontender, nondistended, BS present Ext: warm and well perfused, no pedal edema Neuro: responds to questions appropriately; moving all extremities freely    Lab Results:  Recent Labs  10/04/15 1614 10/04/15 1722 10/05/15 0210  WBC 16.2*  --  10.1  HGB 15.3 13.9 14.0  PLT 252  --  243    Recent Labs  10/05/15 0210 10/05/15 0722  NA 135 139  K 6.6* 5.0  CL 105 110  CO2 18* 20*  GLUCOSE 143* 132*  BUN 19 18  CREATININE 1.31* 1.07    Recent Labs  10/05/15 0210 10/05/15 0722  TROPONINI >65.00* >65.00*    Cardiac Studies: Procedures    Coronary Stent Intervention   Left Heart Cath and Coronary Angiography    Conclusion     Mid LAD to Dist LAD lesion, 15% stenosed.  Prox RCA to Mid RCA lesion, 100% stenosed. Post intervention, there is a 0% residual stenosis.  There is mild left ventricular systolic dysfunction.   Tele: Occasional PVCs.  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  Cardiogenic shock: 2/2 RCA infarction. DA at 5mcg this AM with systolic BP in the upper 0000000. Showing improvement from overnight. Telemetry without additional runs of v-tach this AM. -Wean dopamine gtt -Continue NS @ 200cc/hr -PCCM following, appreciate recommendations  Inferior STEMI s/p DES to the proximal to mid RCA: Complete proximal RCA occlusion on cath yesterday. -Continue ASA, Brillinta -Hold Coreg, ACEi pending improvement in shock -A1c, TSH pending -Continue Lipitor 80mg   Hyperlipidemia: Total cholesterol 200, triglycerides 113, HDL 58, LDL 119.  -Lipitor as noted above  Pseudohyperkalemia: K 6.6 overnight from hemolyzed sample though 5.0 on recheck. Mg 1.9 this AM. Received Kayexalate overnight. -Recheck BMET later today to ensure he does not develop hypokalemia  Acute kidney injury: Creatinine 1.4 on admission, up from 1.1 just few days prior to admission, but 1.1 on most recent labwork. Likely pre-renal on admission though contrast exposure is contributory.  -Follow BMET and IV fluids as noted above    Cory Sloan, PGY2 Internal Medicine Pager: [phone] 10/05/2015, 9:52 AM    Personally seen and examined. Agree with above.  Inferior ST elevation myocardial infarction with right ventricular involvement resulting in cardiogenic shock from right ventricular systolic dysfunction. Left  ventricular end-diastolic pressure was 8 mmHg. He has been aggressively hydrated overnight, continuing with 200 mL/h of fluid which we will continue to decrease throughout the day. He has also been placed on dopamine for pressure support. Currently running at 4 mcg/min. hopefully we will be able to wean this throughout the day as his right  ventricular stunning decreases. Lengthy family conversation. Watch for any signs of fluid overload. Avoid nitroglycerin. Chest discomfort has been waxing and waning throughout the night. Brief episode of ventricular tachycardia nonsustained earlier post PCI. Possible reperfusion.  Critical care time 35 minutes  - Spent with patient's family, patient, extensive review of data, cardiac catheterization, discussion with treatment team, other MDs, nursing. Patient has cardiogenic shock.  Cory Furbish, MD

## 2015-10-05 NOTE — Progress Notes (Signed)
RN Paged MD Eula Fried because pt's BP still not improving after 500 bolus and pt had many critical lab values. MD Eula Fried came to pt bedside and discussed with family the possibility of a central line and starting dopamine. CCM consulted by Eula Fried. Will continue to monitor.

## 2015-10-05 NOTE — Consult Note (Signed)
PULMONARY / CRITICAL CARE MEDICINE   Name: Cory Sloan MRN: [id number] DOB: 08/10/1946    ADMISSION DATE:  10/04/2015 CONSULTATION DATE:  11/16  REFERRING MD : Dr. [name], Cardiology  CHIEF COMPLAINT:  Chest pain  INITIAL PRESENTATION: 69 year old male presented to [facility] emergency department 10/04/2015 complaining of chest pain with radiation to left arm. EKG showed ST elevation in the inferior leads and he was taken emergently to cath lab. Data 100% RCA occlusion and was stented. He was taken to ICU where he developed hypotension and shock overnight. PCCM consulted.  STUDIES:  11/16 Echo >  SIGNIFICANT EVENTS: 11/15 admit for STEMI > cardiac cath with 100% RCA lesion and PCI with DES.    HISTORY OF PRESENT ILLNESS:  69 year old male with past medical history as below, which includes recurrent laryngeal cancer status post laryngectomy in 2005, and COPD. He initially presented to [facility] emergency department 11/10 complaining of chest pain, cardiac workup at that time was negative and he was discharged home on treatment for COPD exacerbation. Chest pain continued intermittently over the next several days until he again presented 11/15 complaining of chest pain with radiation to the left arm. EKG in the emergency department showed ST elevation in the inferior leads with reciprocal ST changes and aVR, aVL. He was taken to the cardiac catheterization lab for emergent intervention. He was found to have 100% occlusion of his RCA. He was treated with drug-eluting stent and there was 0% occlusion after placement of the stent. He was thought to have significant risks for right heart failure and was sent to the ICU for recovery. In the early hours of 11/16 he developed hypotension, which was refractory to IV fluid resuscitation. He was started on dopamine. Lactic acid was elevated at 4.6, PCCM was consulted.  PAST MEDICAL HISTORY :   has a past medical history of Throat cancer ([place]).  has  past surgical history that includes Appendectomy and Throat surgery. Prior to Admission medications   Medication Sig Start Date End Date Taking? Authorizing Provider  albuterol (PROVENTIL HFA;VENTOLIN HFA) 108 (90 BASE) MCG/ACT inhaler Inhale 1-2 puffs into the lungs every 4 (four) hours as needed for wheezing or shortness of breath (or coughing). 09/29/15  Yes Merrily Pew, MD  azithromycin (ZITHROMAX) 250 MG tablet Take 1 tablet (250 mg total) by mouth daily. Take first 2 tablets together, then 1 every day until finished. Patient not taking: Reported on 10/04/2015 09/30/15   Merrily Pew, MD  predniSONE (DELTASONE) 20 MG tablet 3 tabs po day one, then 2 po daily x 4 days Patient not taking: Reported on 10/04/2015 09/30/15   Merrily Pew, MD   No Known Allergies  FAMILY HISTORY:  has no family status information on file.  SOCIAL HISTORY:  reports that he has never smoked. He does not have any smokeless tobacco history on file. He reports that he does not drink alcohol or use illicit drugs.  REVIEW OF SYSTEMS:    Bolds are positive  Constitutional: weight loss, gain, night sweats, Fevers, chills, fatigue .  HEENT: headaches, Sore throat, sneezing, nasal congestion, post nasal drip, Difficulty swallowing, Tooth/dental problems, visual complaints visual changes, ear ache CV:  chest pain, radiates: ,Orthopnea, PND, swelling in lower extremities, dizziness, palpitations, syncope.  GI  heartburn, indigestion, abdominal pain, nausea, vomiting, diarrhea, change in bowel habits, loss of appetite, bloody stools.  Resp: cough, productive: , hemoptysis, dyspnea, chest pain, pleuritic.  Skin: rash or itching or icterus GU: dysuria, change in color  of urine, urgency or frequency. flank pain, hematuria  MS: joint pain or swelling. decreased range of motion  Psych: change in mood or affect. depression or anxiety.  Neuro: difficulty with speech, weakness, numbness, ataxia    SUBJECTIVE:   VITAL  SIGNS: Temp:  [97.8 F (36.6 C)-98.4 F (36.9 C)] 98.4 F (36.9 C) (11/16 0400) Pulse Rate:  [0-204] 57 (11/16 0400) Resp:  [0-103] 20 (11/16 0400) BP: (74-142)/(58-105) 83/67 mmHg (11/16 0400) SpO2:  [0 %-100 %] 98 % (11/16 0400) FiO2 (%):  [100 %] 100 % (11/15 1820) Weight:  [72.7 kg (160 lb 4.4 oz)-79.379 kg (175 lb)] 72.7 kg (160 lb 4.4 oz) (11/15 1820) HEMODYNAMICS:   VENTILATOR SETTINGS: Vent Mode:  [-]  FiO2 (%):  [100 %] 100 % INTAKE / OUTPUT:  Intake/Output Summary (Last 24 hours) at 10/05/15 0426 Last data filed at 10/05/15 0400  Gross per 24 hour  Intake 2278.24 ml  Output    675 ml  Net 1603.24 ml    PHYSICAL EXAMINATION: General:  Male of normal body habitus in NAD Neuro:  Alert, oriented, non-focal HEENT:  /AT, no JVD, PERRL, s/p laryngectomy  Cardiovascular:  RRR, no MRG Lungs:  Clear bilateral breath sounds Abdomen:  Soft, tender, non-distened Musculoskeletal:  No acute deformity or ROM limitation Skin:  Grossly intact  LABS:  CBC  Recent Labs Lab 09/29/15 0910 10/04/15 1614 10/04/15 1722 10/05/15 0210  WBC 6.5 16.2*  --  10.1  HGB 15.0 15.3 13.9 14.0  HCT 45.4 45.2 41.0 42.3  PLT 236 252  --  243   Coag's No results for input(s): APTT, INR in the last 168 hours. BMET  Recent Labs Lab 10/04/15 1614 10/04/15 1722 10/04/15 1835 10/05/15 0210  NA 135 119* 135 135  K 3.4* 2.5* 3.1* 6.6*  CL 102 83* 102 105  CO2 21*  --  22 18*  BUN 22* 20 19 19   CREATININE 1.35* 0.70 1.29* 1.31*  GLUCOSE 160* 141* 140* 143*   Electrolytes  Recent Labs Lab 10/04/15 1614 10/04/15 1835 10/05/15 0210  CALCIUM 9.1 8.3* 8.2*  MG  --  1.8 1.9   Sepsis Markers  Recent Labs Lab 10/05/15 0210  LATICACIDVEN 4.6*   ABG No results for input(s): PHART, PCO2ART, PO2ART in the last 168 hours. Liver Enzymes  Recent Labs Lab 10/04/15 1614  AST 29  ALT 21  ALKPHOS 65  BILITOT 0.9  ALBUMIN 3.9   Cardiac Enzymes  Recent Labs Lab  09/29/15 1514 10/04/15 1835 10/05/15 0210  TROPONINI <0.03 20.08* >65.00*   Glucose No results for input(s): GLUCAP in the last 168 hours.  Imaging Dg Chest Port 1 View  10/04/2015  CLINICAL DATA:  Chest pain shortness of breath 1 day. EXAM: PORTABLE CHEST 1 VIEW COMPARISON:  09/29/2015 FINDINGS: Lordotic technique demonstrated. Lungs are adequately inflated without consolidation or effusion. Cardiomediastinal silhouette is within normal. There are mild degenerative changes of the spine and shoulders. IMPRESSION: No active disease. Electronically Signed   By: Marin Olp M.D.   On: 10/04/2015 19:19     ASSESSMENT / PLAN:  PULMONARY A: No acute issues  P:   Continue supplemental O2 via ATC for SpO2 > 95%   CARDIOVASCULAR A:  Shock, likely cardiogenic & hypovolemic (collapsable IVC >50% on POCUS assessment) STEMI s/p PCI with DES to RCA Probable R heart failure in setting 100% RCA occlusion and high likely hood for RV infarction Lactic acidosis  P:  Telemetry monitoring Aggressive IVF resuscitation May  need CVL and CVP monitoring Peripheral dopamine for now, cap at 10 mcg/kg/min. May need more aggressive pressors, but suspect will respond to volume. Hold carvedilol if possible Echo Continue to trend troponin Cardiology primary Trend lactic  RENAL A:   Acute renal failure Hyperkalemia  P:   Hydration Follow Bmet 1 amp bicarb Calcium gluconate 1gm Kayexalate 15gm   GASTROINTESTINAL A:   No acute issues  P:   NPO for now Pepcid for SUP  HEMATOLOGIC A:   No acute issues  P:  Heparin for VTE ppx Brilinta per cardiology  INFECTIOUS A:   No acute evidence for infection P:   BCx2 11/16 >>> D/c vanc/zosyn, has not gotten dose yet PCT Follow WBC and fever curve  ENDOCRINE A:   Hyperglycemia with no history of DM  P:   Follow glucose on chemistry  NEUROLOGIC A:   No acute issues  P:   Monitor   FAMILY  - Updates: Patient and wife,  daughters updated at bedside 11/16  - Inter-disciplinary family meet or Palliative Care meeting due by:  11/21   Georgann Housekeeper, AGACNP-BC [place] [facility] Pager [phone] or [phone]  10/05/2015 4:51 AM  Attending:  I have seen and examined the patient with nurse practitioner/resident and agree with the note above.   Beryle Quant came to the emergency room this evening with chest pain and was found to have an inferior STEMI.  He went to the cath lab and had 100% RCA occlussion so he was given a DES stent.  He has a history of laryngeal cancer and is s/p laryngectomy.  PCCM was consulted for hypotension  He is currently resting comfortably  On exam: Neuro: wakes easily, follows commands, answers questions PULM: CTA B, normal effort HENT: trach stoma patent, OP clear CV: RRR, no murmur on my exam  EKG reviewed: NSR, ST elevation inferior leads with recipocal changes  Lactic acid 4.6> 3.8  Shock: due to RV infarct; leading to increased lactic acid.  Currently no sign of sepsis.  Plan continued aggressive resuscitation with IVF today.  I don't think there is a role for a CVL for now. Continue dopamine.  If he remains in shock after 7-8 L of crystalloid then consider CVL placement.  Follow lactic acid  Elevated lactic acid in setting of cardiogenic shock from RV infarct> stop antibiotics, continue resuscitation as above  AKI > due to shock, now s/p left heart catheterization so will need to follow UOP and BMET closely; continue IVF  Hyperkalemia> kayexelate given, continue aggressive IVF, follow BMET  My cc time 40 minutes  Roselie Awkward, MD [place] [facility] Pager: [phone] Cell: [phone] After 3pm or if no response, call [phone]

## 2015-10-05 NOTE — Progress Notes (Signed)
CRITICAL VALUE ALERT  Critical value received:  Potassium 6.6  Date of notification:  10/05/15  Time of notification:  [id number]  Critical value read back:Yes.    Nurse who received alert:  Verlin Grills  MD notified (1st page):  Eula Fried  Time of first page:  0305  Responding MD:  Eula Fried  Time MD responded:  [phone]

## 2015-10-05 NOTE — Progress Notes (Signed)
PCCM Rounding Note:  Patient evaluated multiple times today. Continuing to wean his vasopressor support with minimal requirement for dopamine at this time. Patient has had good response to fluid resuscitation. Urine output remains marginal. Renal function this morning remains stable with continued improvement in his hyperkalemia and complete resolution on repeat lab testing this afternoon. Patient's respiratory status remained stable. Plan to reassess in the morning.  Sonia Baller Ashok Cordia, M.D. [facility] Pager:  [phone] After 3pm or if no response, call [phone]

## 2015-10-05 NOTE — Progress Notes (Signed)
CARDIAC REHAB PHASE I   Began ed with pt and 2 brothers. Pt receptive and joking. Sts "my wife will take care of that" in regards to taking meds and diet. Discussed Brilinta and rest. Will f/u tomorrow. Left video to watch.  [place], ACSM 10/05/2015 1:33 PM

## 2015-10-05 NOTE — Progress Notes (Signed)
Paged MD Eula Fried about pt's BP slowing dropping. Currently on left arm is 74/58 (65) and on left leg is 95/66(74). Taken multiple times. Pt still having 3/10 CP, but states it "is not like what brought him into the hospital." Pt states he feels a little short of breath and the CP hurts when taking a deep breath. Pt diaphoretic, but the temperature of the room was set to high and pt with multiple blankets on. MD ordered a 500 bolus and 1.2mg  colchicine. Ordered 12 lead, troponin, and lactic acid to be drawn. Will continue to monitor pt closely.

## 2015-10-05 NOTE — Progress Notes (Signed)
CRITICAL VALUE ALERT  Critical value received:  Troponin >65  Date of notification:  10/05/15  Time of notification:  [id number]  Critical value read back:Yes.    Nurse who received alert:  Verlin Grills  MD notified (1st page):  Eula Fried  Time of first page:  0305  Responding MD:  Eula Fried  Time MD responded:  [phone]

## 2015-10-05 NOTE — Progress Notes (Signed)
ANTIBIOTIC CONSULT NOTE - INITIAL  Pharmacy Consult for Vancomycin and Zosyn  Indication: rule out sepsis  No Known Allergies  Patient Measurements: Height: 5\' 11"  (180.3 cm) Weight: 160 lb 4.4 oz (72.7 kg) IBW/kg (Calculated) : 75.3  Vital Signs: Temp: 98.2 F (36.8 C) (11/16 0000) Temp Source: Oral (11/16 0000) BP: 75/61 mmHg (11/16 0230) Pulse Rate: 59 (11/16 0230) Intake/Output from previous day: 11/15 0701 - 11/16 0700 In: 978.2 [I.V.:978.2] Out: 675 [Urine:675] Intake/Output from this shift: Total I/O In: 859.7 [I.V.:859.7] Out: 325 [Urine:325]  Labs:  Recent Labs  10/04/15 1614 10/04/15 1722 10/04/15 1835 10/05/15 0210  WBC 16.2*  --   --  10.1  HGB 15.3 13.9  --  14.0  PLT 252  --   --  243  CREATININE 1.35* 0.70 1.29* 1.31*   Estimated Creatinine Clearance: 55.5 mL/min (by C-G formula based on Cr of 1.31). No results for input(s): VANCOTROUGH, VANCOPEAK, VANCORANDOM, GENTTROUGH, GENTPEAK, GENTRANDOM, TOBRATROUGH, TOBRAPEAK, TOBRARND, AMIKACINPEAK, AMIKACINTROU, AMIKACIN in the last 72 hours.   Microbiology: Recent Results (from the past 720 hour(s))  MRSA PCR Screening     Status: None   Collection Time: 10/04/15  6:46 PM  Result Value Ref Range Status   MRSA by PCR NEGATIVE NEGATIVE Final    Comment:        The GeneXpert MRSA Assay (FDA approved for NASAL specimens only), is one component of a comprehensive MRSA colonization surveillance program. It is not intended to diagnose MRSA infection nor to guide or monitor treatment for MRSA infections.     Medical History: Past Medical History  Diagnosis Date  . Throat cancer ([place])     Medications:  Prescriptions prior to admission  Medication Sig Dispense Refill Last Dose  . albuterol (PROVENTIL HFA;VENTOLIN HFA) 108 (90 BASE) MCG/ACT inhaler Inhale 1-2 puffs into the lungs every 4 (four) hours as needed for wheezing or shortness of breath (or coughing). 1 Inhaler 0 Past Week at Unknown time   . azithromycin (ZITHROMAX) 250 MG tablet Take 1 tablet (250 mg total) by mouth daily. Take first 2 tablets together, then 1 every day until finished. (Patient not taking: Reported on 10/04/2015) 4 tablet 0 Completed Course at Unknown time  . predniSONE (DELTASONE) 20 MG tablet 3 tabs po day one, then 2 po daily x 4 days (Patient not taking: Reported on 10/04/2015) 8 tablet 0 Completed Course at Unknown time   Assessment: 69 y.o. male with hypotension, possible sepsis, for empiric antibiotics  Goal of Therapy:  Vancomycin trough level 15-20 mcg/ml  Plan:  Vancomycin 1250 mg IV now, then Vancomycin 1 g IV q24h Zosyn 3.375 g IV q8h   Mar[place]o Rothgeb, [place] Curb 10/05/2015,3:10 AM

## 2015-10-05 NOTE — Progress Notes (Signed)
CRITICAL VALUE ALERT  Critical value received:  Lactic acid 3.8  Date of notification:  10/05/15  Time of notification:  0600  Critical value read back:Yes.    Nurse who received alert:  Verlin Grills  MD notified (1st page):  [place]  Time of first page:  0600   Responding MD:  [place]  Time MD responded:  0600

## 2015-10-05 NOTE — Progress Notes (Signed)
CRITICAL VALUE ALERT  Critical value received:  Lactic Acid 4.6  Date of notification:  10/05/15  Time of notification:  [id number]  Critical value read back:Yes.    Nurse who received alert:  Verlin Grills  MD notified (1st page):  Eula Fried  Time of first page:  0305  Responding MD:  Eula Fried  Time MD responded:  [phone]

## 2015-10-05 NOTE — Progress Notes (Signed)
TR band removed at 0400. Site level 0. Pulse +2. Pressure dressing applied. Pt educated on limiting use of R arm. Will continue to monitor.

## 2015-10-06 ENCOUNTER — Inpatient Hospital Stay (HOSPITAL_COMMUNITY): Payer: Medicare Other

## 2015-10-06 DIAGNOSIS — E872 Acidosis: Secondary | ICD-10-CM

## 2015-10-06 DIAGNOSIS — I2119 ST elevation (STEMI) myocardial infarction involving other coronary artery of inferior wall: Secondary | ICD-10-CM | POA: Diagnosis not present

## 2015-10-06 DIAGNOSIS — R042 Hemoptysis: Secondary | ICD-10-CM | POA: Insufficient documentation

## 2015-10-06 LAB — HEMOGLOBIN A1C
HEMOGLOBIN A1C: 5.8 % — AB (ref 4.8–5.6)
Mean Plasma Glucose: 120 mg/dL

## 2015-10-06 LAB — CBC
HCT: 39.8 % (ref 39.0–52.0)
Hemoglobin: 13.2 g/dL (ref 13.0–17.0)
MCH: 30.3 pg (ref 26.0–34.0)
MCHC: 33.2 g/dL (ref 30.0–36.0)
MCV: 91.5 fL (ref 78.0–100.0)
PLATELETS: 200 10*3/uL (ref 150–400)
RBC: 4.35 MIL/uL (ref 4.22–5.81)
RDW: 13.2 % (ref 11.5–15.5)
WBC: 14.2 10*3/uL — ABNORMAL HIGH (ref 4.0–10.5)

## 2015-10-06 LAB — BASIC METABOLIC PANEL
Anion gap: 7 (ref 5–15)
BUN: 18 mg/dL (ref 6–20)
CALCIUM: 8.3 mg/dL — AB (ref 8.9–10.3)
CO2: 21 mmol/L — AB (ref 22–32)
CREATININE: 0.93 mg/dL (ref 0.61–1.24)
Chloride: 110 mmol/L (ref 101–111)
GFR calc non Af Amer: >60 mL/min (ref >60)
Glucose, Bld: 86 mg/dL (ref 65–99)
Potassium: 4 mmol/L (ref 3.5–5.1)
SODIUM: 138 mmol/L (ref 135–145)

## 2015-10-06 LAB — PROTIME-INR
INR: 1.05 (ref 0.00–1.49)
Prothrombin Time: 13.9 seconds (ref 11.6–15.2)

## 2015-10-06 LAB — TSH: TSH: 10.168 u[IU]/mL — AB (ref 0.350–4.500)

## 2015-10-06 LAB — APTT: APTT: 26 s (ref 24–37)

## 2015-10-06 MED ORDER — LEVALBUTEROL HCL 0.63 MG/3ML IN NEBU
INHALATION_SOLUTION | RESPIRATORY_TRACT | Status: AC
  Filled 2015-10-06: qty 3

## 2015-10-06 MED ORDER — LEVALBUTEROL HCL 0.63 MG/3ML IN NEBU
0.6300 mg | INHALATION_SOLUTION | Freq: Four times a day (QID) | RESPIRATORY_TRACT | Status: DC | PRN
Start: 2015-10-06 — End: 2015-10-07
  Administered 2015-10-06: 0.63 mg via RESPIRATORY_TRACT

## 2015-10-06 MED ORDER — CARVEDILOL 3.125 MG PO TABS
3.1250 mg | ORAL_TABLET | Freq: Two times a day (BID) | ORAL | Status: DC
  Filled 2015-10-06: qty 1

## 2015-10-06 NOTE — Progress Notes (Signed)
PULMONARY / CRITICAL CARE MEDICINE   Name: Cory Sloan MRN: [id number] DOB: [birth year]    ADMISSION DATE:  10/04/2015 CONSULTATION DATE:  11/16  REFERRING MD : Dr. [name], Cardiology  CHIEF COMPLAINT:  Chest pain  INITIAL PRESENTATION: 69 year old male presented to [facility] emergency department 10/04/2015 complaining of chest pain with radiation to left arm. EKG showed ST elevation in the inferior leads and he was taken emergently to cath lab. Data 100% RCA occlusion and was stented. He was taken to ICU where he developed hypotension and shock overnight. PCCM consulted.  STUDIES:  Port CXR 11/15 - No focal opacity or effusion appreciated. CT Chest W/O 11/17 - Pending  MICROBIOLOGY: Blood Ctx 11/16>>>  SIGNIFICANT EVENTS: 11/15 admit for STEMI - cardiac cath with 100% RCA lesion and PCI with DES.   HISTORY OF PRESENT ILLNESS:  69 year old male with past medical history as below, which includes recurrent laryngeal cancer status post laryngectomy in 2005, and COPD. He initially presented to [facility] emergency department 11/10 complaining of chest pain, cardiac workup at that time was negative and he was discharged home on treatment for COPD exacerbation. Chest pain continued intermittently over the next several days until he again presented 11/15 complaining of chest pain with radiation to the left arm. EKG in the emergency department showed ST elevation in the inferior leads with reciprocal ST changes and aVR, aVL. He was taken to the cardiac catheterization lab for emergent intervention. He was found to have 100% occlusion of his RCA. He was treated with drug-eluting stent and there was 0% occlusion after placement of the stent. He was thought to have significant risks for right heart failure and was sent to the ICU for recovery. In the early hours of 11/16 he developed hypotension, which was refractory to IV fluid resuscitation. He was started on dopamine. Lactic acid was elevated at  4.6, PCCM was consulted.  SUBJECTIVE:  Denies any chest pain or pressure. Reports he is breathing well at this time. Overnight patient did begin to have a cough productive of small amounts of bright red blood. Patient denies any clotted blood. Patient reports he had hemoptysis year ago which his physicians had attributed to drying of his airways with lack of humidification.  REVIEW OF SYSTEMS:   No subjective fever, chills, or sweats. No nausea or vomiting.   VITAL SIGNS: Temp:  [98 F (36.7 C)-98.7 F (37.1 C)] 98 F (36.7 C) (11/17 0800) Pulse Rate:  [53-103] 80 (11/17 0900) Resp:  [12-24] 18 (11/17 0900) BP: (84-109)/(63-78) 96/66 mmHg (11/17 0900) SpO2:  [91 %-100 %] 92 % (11/17 0900) FiO2 (%):  [28 %] 28 % (11/17 0841) HEMODYNAMICS:   VENTILATOR SETTINGS: Vent Mode:  [-]  FiO2 (%):  [28 %] 28 % INTAKE / OUTPUT:  Intake/Output Summary (Last 24 hours) at 10/06/15 0942 Last data filed at 10/06/15 0900  Gross per 24 hour  Intake 2774.82 ml  Output   3475 ml  Net -700.18 ml    PHYSICAL EXAMINATION: General:  Awake. Alert. No acute distress. Sitting up in bed Integument: No rash on exposed skin. No bruising. HEENT:  Moist mucus membranes. No scleral injection or icterus. No evidence of bleeding on direct inspection of tracheostomy stoma. Cardiovascular:  Regular rate. No edema. No appreciable JVD.  Pulmonary:  Good aeration & clear to auscultation bilaterally. Symmetric chest wall expansion. No accessory muscle use on humidified T-collar. Abdomen: Soft. Normal bowel sounds. Nondistended. Grossly nontender.  LABS:  CBC  Recent Labs  Lab 10/04/15 1614 10/04/15 1722 10/05/15 0210 10/06/15 0350  WBC 16.2*  --  10.1 14.2*  HGB 15.3 13.9 14.0 13.2  HCT 45.2 41.0 42.3 39.8  PLT 252  --  243 200   Coag's  Recent Labs Lab 10/06/15 0845  APTT 26  INR 1.05   BMET  Recent Labs Lab 10/05/15 1310 10/05/15 1845 10/06/15 0350  NA 138 138 138  K 4.1 4.2 4.0  CL 110  109 110  CO2 20* 19* 21*  BUN 16 20 18   CREATININE 1.06 1.11 0.93  GLUCOSE 123* 102* 86   Electrolytes  Recent Labs Lab 10/04/15 1835 10/05/15 0210  10/05/15 1310 10/05/15 1845 10/06/15 0350  CALCIUM 8.3* 8.2*  < > 8.3* 8.3* 8.3*  MG 1.8 1.9  --   --   --   --   < > = values in this interval not displayed. Sepsis Markers  Recent Labs Lab 10/05/15 0210 10/05/15 0510  LATICACIDVEN 4.6* 3.8*   ABG No results for input(s): PHART, PCO2ART, PO2ART in the last 168 hours. Liver Enzymes  Recent Labs Lab 10/04/15 1614  AST 29  ALT 21  ALKPHOS 65  BILITOT 0.9  ALBUMIN 3.9   Cardiac Enzymes  Recent Labs Lab 10/05/15 0210 10/05/15 0722 10/05/15 1310  TROPONINI >65.00* >65.00* >65.00*   Glucose No results for input(s): GLUCAP in the last 168 hours.  Imaging No results found.   ASSESSMENT / PLAN:  69 year old male admitted with ST elevation MI now status post stenting to his right coronary artery. Patient developed mild hemoptysis overnight which she reports has happened previously was trying of his airways. His respiratory status remains stable. The patient has successfully weaned off of vasopressor support after fluid resuscitation. Patient does have a mild leukocytosis this morning but it's unclear whether or not this may be due to some element of stress response/demargination given his recent myocardial infarction. I doubt an infectious process to his intermittent hemoptysis given absence of fever overnight. The patient has had good urine output with stable renal function.  1. Cardiogenic shock: Resolved post fluid resuscitation. Currently off of vasopressor support. 2. Hemoptysis: Mild. Serum coags normal. Patient does have humidification for oxygen. Checking CT scan of the chest without contrast. 3. Acute Renal Failure: Resolving. Continuing to trend urine output along with daily BUN/creatinine for renal function. 4. Hyperkalemia: Resolved. Likely secondary to acute  renal failure. Monitoring on telemetry. 5. Lactic acidosis: Likely secondary to acute myocardial infarction. Improving. Repeat lactic acid with a.m. Labs. 6. Leukocytosis: Suspect stress response. Continue to monitor for fever. Holding on antibiotics for now pending CT scan of the chest. Continuing to trend daily with CBC.  Sonia Baller Ashok Cordia, M.D. [facility] Pulmonary & Critical Care Pager:  [phone] After 3pm or if no response, call [phone] 10/06/2015 9:42 AM

## 2015-10-06 NOTE — Plan of Care (Signed)
Problem: Tissue Perfusion: Goal: Risk factors for ineffective tissue perfusion will decrease Outcome: Completed/Met Date Met:  10/06/15 Pt educated on VTE prophylaxis and importanceof medication compliance. Pt verbalized understanding.

## 2015-10-06 NOTE — Progress Notes (Signed)
Subjective:  Off dopamine now though complained of pink-tinged sputum which he had coughing up overnight. No worsening in his chest discomfort or dyspnea this AM. Reports he had just cut down a 80 foot tree when he developed the chest pain that brought him to the hospital 2 days ago.  Objective:  Vital Signs in the last 24 hours: Temp:  [98 F (36.7 C)-98.7 F (37.1 C)] 98 F (36.7 C) (11/17 0800) Pulse Rate:  [53-103] 90 (11/17 0841) Resp:  [12-24] 17 (11/17 0841) BP: (84-109)/(63-78) 101/69 mmHg (11/17 0800) SpO2:  [91 %-100 %] 100 % (11/17 0841) FiO2 (%):  [28 %] 28 % (11/17 0841)  Intake/Output from previous day: 11/16 0701 - 11/17 0700 In: 3035.8 [P.O.:240; I.V.:2795.8] Out: [id number] [Urine:3475]    Physical Exam: General: elderly Caucasian male, resting in bed, trach collar in place HEENT: PERRL, EOMI, no scleral icterus, oropharynx clear Cardiac: RRR, no rubs, murmurs or gallops Pulm: clear to auscultation bilaterally in the anterior lung fields, no wheezes, rales, or rhonchi Abd: soft, nontender, nondistended, BS present Ext: warm and well perfused, no pedal edema Neuro: responds to questions appropriately; moving all extremities freely    Lab Results:  Recent Labs  10/05/15 0210 10/06/15 0350  WBC 10.1 14.2*  HGB 14.0 13.2  PLT 243 200    Recent Labs  10/05/15 1845 10/06/15 0350  NA 138 138  K 4.2 4.0  CL 109 110  CO2 19* 21*  GLUCOSE 102* 86  BUN 20 18  CREATININE 1.11 0.93    Recent Labs  10/05/15 0722 10/05/15 1310  TROPONINI >65.00* >65.00*    Cardiac Studies: Procedures    Coronary Stent Intervention   Left Heart Cath and Coronary Angiography    Conclusion     Mid LAD to Dist LAD lesion, 15% stenosed.  Prox RCA to Mid RCA lesion, 100% stenosed. Post intervention, there is a 0% residual stenosis.  There is mild left ventricular systolic dysfunction.   Tele: Normal sinus rhythm.  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  Hemoptysis: Estimated loss to be around 1 tbsp - 1 cup. No signs of PNA on CXR a few days ago. Hb 13 today, down from 14 yesterday which is reassuring.  -Discontinued heparain and will encourage mobilization out of bed today -Chest CT per PCCM  Cardiogenic shock: 2/2 RCA stunning though now resolved and off IV fluids. BP 90s-100s/60s-70s, HR 60-70s.  Inferior STEMI s/p DES to the proximal to mid RCA: Complete proximal RCA occlusion on cath though improved flow after stent placement. TSH 10 though suspect it may be sequelae of his XRT and laryngectomy as he denied any changes in his functional status in the preceding weeks. A1c 5.8. -Continue ASA, Brillinta -Restart Coreg 3.125 twice daily today and defer ACEi pending improvement in shock -Continue Lipitor 80mg   Hyperlipidemia: Total cholesterol 200, triglycerides 113, HDL 58, LDL 119.  -Lipitor as noted above  Acute kidney injury: Resolved Creatinine 0.9 today, improved from 1.4 on admission 1.1 just few days prior to admission. Likely pre-renal on admission though contrast exposure is contributory.  -Follow BMET and IV fluids as noted above    Cory Sloan, PGY2 Internal Medicine Pager: [phone] 10/06/2015, 9:09  AM   Personally seen and examined. Agree with above.  Improved RV infarct with stunning Will hold coreg again today BP improved. Off Dopamine Mild blood tinged secretions - off heparin now CT scan done, discussed with Dr. Ashok Cordia. Observe.  Transfer to tele.   Cory Furbish, MD

## 2015-10-06 NOTE — Plan of Care (Signed)
Problem: Pain Managment: Goal: General experience of comfort will improve Outcome: Completed/Met Date Met:  10/06/15 Pt educated on pain scale and interventions available. Pt verbalized understanding. Pt has no complaints of pain at this time.

## 2015-10-06 NOTE — Progress Notes (Signed)
Pt coughing up bloody sputum, size of a penny. Pt has no complaints at this time. VSS. Will continue to monitor.   Ruben Reason, RN

## 2015-10-06 NOTE — Progress Notes (Signed)
Pt called RN to room because feeling a little more short of breath and having cough with pink tinged production through stoma. RN called Warren Lacy MD. MD ordered PRN breathing treatment. After breathing treatment, pt states he felt "better" and could breath easier. O2 level before treatment was 94% on 2 L trach collar. O2 after treatment was 95-96% on 2L trach collar. RN checked back at 0500 and pt said he is not coughing up as pink of production. Will continue to monitor.

## 2015-10-06 NOTE — Plan of Care (Signed)
Problem: Safety: Goal: Ability to remain free from injury will improve Outcome: Completed/Met Date Met:  10/06/15 Pt educated on safety measures put into place. Pt verbalized understanding of safety measures.

## 2015-10-06 NOTE — Progress Notes (Signed)
CARDIAC REHAB PHASE I   PRE:  Rate/Rhythm: 95 SR    BP: sitting 96/61    SaO2: 97 RA  MODE:  Ambulation: 550 ft   POST:  Rate/Rhythm: 112 ST    BP: sitting 96/66     SaO2: 96 RA  Tolerated well. Denied dizziness or fatigue with quick pace. Only c/o is bloody sputum production (bright red, not just streaked, entire phelgm). Pt sts he does have chronic bronchitis and gets streaked blood in sputum sometimes. Ed completed with pt and wife. Voiced understanding and will send referral to [facility]. Can walk independently. Pt exhausted by end of ed and falling asleep. Asked visitors to give him some rest. 1320-1425   Josephina Shih [place] CES, ACSM 10/06/2015 2:24 PM

## 2015-10-07 ENCOUNTER — Inpatient Hospital Stay (HOSPITAL_COMMUNITY): Payer: Medicare Other

## 2015-10-07 ENCOUNTER — Telehealth: Payer: Self-pay | Admitting: Nurse Practitioner

## 2015-10-07 LAB — CBC
HEMATOCRIT: 42.7 % (ref 39.0–52.0)
Hemoglobin: 14.1 g/dL (ref 13.0–17.0)
MCH: 29.9 pg (ref 26.0–34.0)
MCHC: 33 g/dL (ref 30.0–36.0)
MCV: 90.5 fL (ref 78.0–100.0)
PLATELETS: 196 10*3/uL (ref 150–400)
RBC: 4.72 MIL/uL (ref 4.22–5.81)
RDW: 13 % (ref 11.5–15.5)
WBC: 10.4 10*3/uL (ref 4.0–10.5)

## 2015-10-07 LAB — BASIC METABOLIC PANEL
ANION GAP: 7 (ref 5–15)
BUN: 14 mg/dL (ref 6–20)
CALCIUM: 8.2 mg/dL — AB (ref 8.9–10.3)
CHLORIDE: 104 mmol/L (ref 101–111)
CO2: 24 mmol/L (ref 22–32)
CREATININE: 1.1 mg/dL (ref 0.61–1.24)
Glucose, Bld: 91 mg/dL (ref 65–99)
Potassium: 3.6 mmol/L (ref 3.5–5.1)
SODIUM: 135 mmol/L (ref 135–145)

## 2015-10-07 LAB — LACTIC ACID, PLASMA: LACTIC ACID, VENOUS: 0.9 mmol/L (ref 0.5–2.0)

## 2015-10-07 MED ORDER — ATORVASTATIN CALCIUM 80 MG PO TABS: 80.0000 mg | ORAL_TABLET | Freq: Every day | ORAL | Status: DC

## 2015-10-07 MED ORDER — ASPIRIN 81 MG PO TBEC: 81.0000 mg | DELAYED_RELEASE_TABLET | Freq: Every day | ORAL | Status: DC

## 2015-10-07 MED ORDER — NITROGLYCERIN 0.4 MG SL SUBL: 0.4000 mg | SUBLINGUAL_TABLET | SUBLINGUAL | Status: DC | PRN

## 2015-10-07 MED ORDER — TICAGRELOR 90 MG PO TABS: 90.0000 mg | ORAL_TABLET | Freq: Two times a day (BID) | ORAL | Status: DC

## 2015-10-07 NOTE — Plan of Care (Signed)
Problem: Activity: Goal: Risk for activity intolerance will decrease Outcome: Completed/Met Date Met:  10/07/15 Pt tolerating ambulation and activity well. No complaints from pt at this time.

## 2015-10-07 NOTE — Plan of Care (Signed)
Problem: Physical Regulation: Goal: Ability to maintain clinical measurements within normal limits will improve Outcome: Completed/Met Date Met:  10/07/15 Pt tolerating treatment well. Pt has no complaints at this time.

## 2015-10-07 NOTE — Plan of Care (Signed)
Problem: Health Behavior/Discharge Planning: Goal: Ability to manage health-related needs will improve Outcome: Completed/Met Date Met:  10/07/15 Pt educated on compliance of medications and treatments.

## 2015-10-07 NOTE — Plan of Care (Signed)
Problem: Education: Goal: Knowledge of [facility] Education information/materials will improve Outcome: Completed/Met Date Met:  10/07/15 Pt educated on safety measures, pain scale, new medications, and discharge plans.

## 2015-10-07 NOTE — Progress Notes (Signed)
PULMONARY / CRITICAL CARE MEDICINE   Name: Cory Sloan MRN: [id number] DOB: 01-Oct-1946    ADMISSION DATE:  10/04/2015 CONSULTATION DATE:  11/16  REFERRING MD : Dr. [name], Cardiology  CHIEF COMPLAINT:  Chest pain  INITIAL PRESENTATION: 69 year old male presented to [facility] emergency department 10/04/2015 complaining of chest pain with radiation to left arm. EKG showed ST elevation in the inferior leads and he was taken emergently to cath lab. Data 100% RCA occlusion and was stented. He was taken to ICU where he developed hypotension and shock overnight. PCCM consulted.  STUDIES:  Port CXR 11/15 - No focal opacity or effusion appreciated. CT Chest W/O 11/17 - emphysema wit UL GGO MICROBIOLOGY: Blood Ctx 11/16>>>  SIGNIFICANT EVENTS: 11/15 admit for STEMI - cardiac cath with 100% RCA lesion and PCI with DES.     SUBJECTIVE/OVERNIGHT/INTERVAL HX 10/07/15- .still with some mixed hemoptysis. He wand wife say even at baseline he will get it itnermittently when air is dry. He thinks currently room is dry + anticoag making him have hemoptysis. CT findings noted - personally visualized.Otherwise well. He feels he needs opd pulm for this problem though at bseline he is functional  VITAL SIGNS: Temp:  [98.8 F (37.1 C)-99.1 F (37.3 C)] 99.1 F (37.3 C) (11/18 0427) Pulse Rate:  [91-102] 99 (11/18 0427) Resp:  [16-18] 18 (11/18 0427) BP: (84-107)/(60-70) 84/60 mmHg (11/18 0812) SpO2:  [93 %-95 %] 94 % (11/18 0427) Weight:  [72.984 kg (160 lb 14.4 oz)] 72.984 kg (160 lb 14.4 oz) (11/18 0427) HEMODYNAMICS:   VENTILATOR SETTINGS:   INTAKE / OUTPUT:  Intake/Output Summary (Last 24 hours) at 10/07/15 1106 Last data filed at 10/07/15 0904  Gross per 24 hour  Intake    720 ml  Output      0 ml  Net    720 ml    PHYSICAL EXAMINATION: General:  Awake. Alert. No acute distress. Sitting up in bed Integument: No rash on exposed skin. No bruising. HEENT:  Moist mucus membranes.  No scleral injection or icterus. No evidence of bleeding on direct inspection of tracheostomy stoma. Cardiovascular:  Regular rate. No edema. No appreciable JVD.  Pulmonary:  Good aeration & clear to auscultation bilaterally. Symmetric chest wall expansion. No accessory muscle use. Abdomen: Soft. Normal bowel sounds. Nondistended. Grossly nontender.  LABS:  CBC  Recent Labs Lab 10/05/15 0210 10/06/15 0350 10/07/15 0545  WBC 10.1 14.2* 10.4  HGB 14.0 13.2 14.1  HCT 42.3 39.8 42.7  PLT 243 200 196   Coag's  Recent Labs Lab 10/06/15 0845  APTT 26  INR 1.05   BMET  Recent Labs Lab 10/05/15 1845 10/06/15 0350 10/07/15 0545  NA 138 138 135  K 4.2 4.0 3.6  CL 109 110 104  CO2 19* 21* 24  BUN 20 18 14   CREATININE 1.11 0.93 1.10  GLUCOSE 102* 86 91   Electrolytes  Recent Labs Lab 10/04/15 1835 10/05/15 0210  10/05/15 1845 10/06/15 0350 10/07/15 0545  CALCIUM 8.3* 8.2*  < > 8.3* 8.3* 8.2*  MG 1.8 1.9  --   --   --   --   < > = values in this interval not displayed. Sepsis Markers  Recent Labs Lab 10/05/15 0210 10/05/15 0510 10/07/15 0545  LATICACIDVEN 4.6* 3.8* 0.9   ABG No results for input(s): PHART, PCO2ART, PO2ART in the last 168 hours. Liver Enzymes  Recent Labs Lab 10/04/15 1614  AST 29  ALT 21  ALKPHOS 65  BILITOT  0.9  ALBUMIN 3.9   Cardiac Enzymes  Recent Labs Lab 10/05/15 0210 10/05/15 0722 10/05/15 1310  TROPONINI >65.00* >65.00* >65.00*   Glucose No results for input(s): GLUCAP in the last 168 hours.  Imaging No results found.   ASSESSMENT / PLAN:  69 year old male admitted with ST elevation MI now status post stenting to his right coronary artery. Patient developed mild hemoptysis overnight 10/04/16 - 10/06/15 which she reports has happened previously was trying of his airways.  On 10/07/2015 -hemoptysis still present. Not worse.  Since then h eparin IV has been stopped  Hemoptysis likely due to IV heparin + dryness in  air + baseline tacheal stoma  PLAN - opd fu Dr Ashok Cordia for pulmonary issues - agree with dc heparibn - get CXR 10/07/2015  - Should settle down  PCCM will sign off Call if worsens  Future Appointments Date Time Provider [place]  11/30/2015 3:30 PM Javier Glazier, MD [facility] None     Dr. Brand Males, M.D., F.C.C.P Pulmonary and Critical Care Medicine Staff Physician [place] [facility] Pager: [phone], If no answer or between  15:00h - 7:00h: call [phone]  10/07/2015 11:09 AM

## 2015-10-07 NOTE — Plan of Care (Signed)
Problem: Cardiac: Goal: Vascular access site(s) Level 0-1 will be maintained Outcome: Completed/Met Date Met:  10/07/15 Vascular site level 0. No complications at this time.

## 2015-10-07 NOTE — Care Management Important Message (Signed)
Important Message  Patient Details  Name: Cory Sloan MRN: [id number] Date of Birth: [birth year]   Medicare Important Message Given:  Yes    Nathen May 10/07/2015, 11:50 AM

## 2015-10-07 NOTE — Discharge Summary (Signed)
Discharge Summary   Patient ID: Cory Sloan,  MRN: [id number], DOB/AGE: Oct 04, 1946 69 y.o.  Admit date: 10/04/2015 Discharge date: 10/07/2015  Primary Care Provider: No primary care provider on file. Primary Cardiologist: Dr. [name] ([place])  Discharge Diagnoses Active Problems:   ST elevation myocardial infarction involving right coronary artery (HCC)   ST elevation (STEMI) myocardial infarction involving right coronary artery (HCC)   Chronic obstructive pulmonary disease (HCC)   Hx of laryngeal cancer   Cardiogenic shock   Hemoptysis   Acute Renal Failure   Hyperkalemia   Lactic acidosis   Leukocytosis   Elevated TSH   Allergies No Known Allergies  Consultant: PCCM  Procedures  Coronary Stent Intervention 10/04/15   Left Heart Cath and Coronary Angiography    Conclusion     Mid LAD to Dist LAD lesion, 15% stenosed.  Prox RCA to Mid RCA lesion, 100% stenosed. Post intervention, there is a 0% residual stenosis.  There is mild left ventricular systolic dysfunction.  Acute ST segment elevation myocardial infarction secondary to total proximal occlusion of a large dominant RCA.  Mild luminal irregularities of the LAD without significant obstructive disease.  Normal left circumflex coronary artery.  Mild acute LV dysfunction with an ejection fraction of 50% with mild mid inferior wall hypocontractility.  Successful PCI/DES stenting of a large dominant RCA with ultimate insertion of a 4.032 mm Synergy DES stent postdilated to 4.6 mm 100% occlusion reduced to 0% and restoration of brisk TIMI-3 flow without evidence for dissection.  RECOMMENDATION: The patient will be maintained on dual antiplatelet therapy. He will be vigorously hydrated due to his large RCA and potential for RV infarction. I would anticipate good recovery of LV function. Once his blood pressure allows he will be started on beta blocker, ACE inhibitor with high potency statin therapy.        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 He was seen in ED on 11/10 and discharged on prednisone and albuterol for an acute COPD exacerbation. Troponins were not elevated at that time x 2. Per ED, pain worsened approximately 90 minutes to presentation today and was noted to be left substernal chest pressure without radiation. Initial EKG showed ST elevations in the inferior leads with reciprocal ST changes in avR, avL. Initial troponin negative. He was taken for emergent cath.   Hospital Course  Cath showed complete proximal RCA occlusion s/p DES to the proximal to mid RCA. Post cath patient developed cardiogenic shock and hypotension. Which was refractory to IV fluid resuscitation initially . He was started on dopamine and PCCM consulted for elevated lactic acid level of 4.6.  Symptoms improved with fluid resuscitation and continued to wean off dopamine. Renal function remains stable after fluid resuscitation with continued improvement in his hyperkalemia (pesudohyperkalemia) with resolution after kayexalate tx. He complained of pink-tinged sputum/ mild hemoptysis. Chest CT showed Emphysematous disease with new mixed interstitial airspace process over the posterior segment upper lobe bilaterally likely infection. Small bilateral pleural effusions with bibasilar atelectasis. However resolution of leucocytosis. Hemoptysis likely due to IV heparin + dryness in air + baseline tacheal stoma. Lactic acidosis felt likely secondary to acute myocardial infarction. Ambulated well with cardiac rehab.   She has been seen by Dr. Marlou Porch today and  deemed ready for discharge home. All follow-up appointments have been scheduled. Discharge medications are listed below.   Unable to  start beta blocker and ACE inhibitor due to asymptomatic hypotension. He walked with physical therapy and felt great. Add as outpatient. Continue Brilinta, ASA and lipitor 80mg . TSH is elevated this admission likely due to acute illness. Repeat in 4-6 weeks. Consider OP f/u labs 6-8 weeks given statin initiation this admission.  Discharge Vitals Blood pressure 95/56, pulse 87, temperature 98.4 F (36.9 C), temperature source Oral, resp. rate 17, height 5\' 11"  (1.803 m), weight 160 lb 14.4 oz (72.984 kg), SpO2 96 %.  Filed Weights   10/04/15 1610 10/04/15 1820 10/07/15 0427  Weight: 175 lb (79.379 kg) 160 lb 4.4 oz (72.7 kg) 160 lb 14.4 oz (72.984 kg)    Labs  CBC  Recent Labs  10/04/15 1614  10/06/15 0350 10/07/15 0545  WBC 16.2*  < > 14.2* 10.4  NEUTROABS 9.4*  --   --   --   HGB 15.3  < > 13.2 14.1  HCT 45.2  < > 39.8 42.7  MCV 89.0  < > 91.5 90.5  PLT 252  < > 200 196  < > = values in this interval not displayed. Basic Metabolic Panel  Recent Labs  10/04/15 1835 10/05/15 0210  10/06/15 0350 10/07/15 0545  NA 135 135  < > 138 135  K 3.1* 6.6*  < > 4.0 3.6  CL 102 105  < > 110 104  CO2 22 18*  < > 21* 24  GLUCOSE 140* 143*  < > 86 91  BUN 19 19  < > 18 14  CREATININE 1.29* 1.31*  < > 0.93 1.10  CALCIUM 8.3* 8.2*  < > 8.3* 8.2*  MG 1.8 1.9  --   --   --   < > = values in this interval not displayed. Liver Function Tests  Recent Labs  10/04/15 1614  AST 29  ALT 21  ALKPHOS 65  BILITOT 0.9  PROT 6.7  ALBUMIN 3.9   No results for input(s): LIPASE, AMYLASE in the last 72 hours. Cardiac Enzymes  Recent Labs  10/05/15 0210 10/05/15 0722 10/05/15 1310  TROPONINI >65.00* >65.00* >65.00*   Hemoglobin A1C  Recent Labs  10/05/15 0210  HGBA1C 5.8*   Fasting Lipid Panel  Recent Labs  10/05/15 0733  CHOL 200  HDL 58  LDLCALC 119*  TRIG 113  CHOLHDL 3.4   Thyroid Function Tests  Recent Labs  10/06/15 0350  TSH 10.168*    Disposition  Pt  is being discharged home today in good condition.  Follow-up Plans & Appointments  Follow-up Information    Follow up with Cory Merle, NP. Go on 10/12/2015.   Specialties:  Nurse Practitioner, Interventional Cardiology, Cardiology, Radiology   Why:  @10 :00am    Contact information:   [address] [place] [address] [phone]           Discharge Instructions    AMB Referral to Cardiac Rehabilitation - Phase II    Complete by:  As directed   Diagnosis:  Myocardial Infarction     Amb Referral to Cardiac Rehabilitation    Complete by:  As directed   Diagnosis:   PCI Myocardial Infarction       Call MD for:  redness, tenderness, or signs of infection (pain, swelling, redness, odor or green/yellow discharge around incision site)    Complete by:  As directed  Diet - low sodium heart healthy    Complete by:  As directed      Discharge instructions    Complete by:  As directed   No driving for 2 weeks. No lifting over 10 lbs for 4 weeks. No sexual activity for 4 weeks. You may not return to work until cleared by your cardiologist. Keep procedure site clean & dry. If you notice increased pain, swelling, bleeding or pus, call/return!  You may shower, but no soaking baths/hot tubs/pools for 1 week.     Increase activity slowly    Complete by:  As directed            F/u Labs/Studies: TSH in 4-6 weeks. Consider OP f/u labs 6-8 weeks given statin initiation this admission.  Discharge Medications    Medication List    STOP taking these medications        azithromycin 250 MG tablet  Commonly known as:  ZITHROMAX     predniSONE 20 MG tablet  Commonly known as:  DELTASONE      TAKE these medications        albuterol 108 (90 BASE) MCG/ACT inhaler  Commonly known as:  PROVENTIL HFA;VENTOLIN HFA  Inhale 1-2 puffs into the lungs every 4 (four) hours as needed for wheezing or shortness of breath (or coughing).     aspirin 81 MG EC tablet  Take 1  tablet (81 mg total) by mouth daily.     atorvastatin 80 MG tablet  Commonly known as:  LIPITOR  Take 1 tablet (80 mg total) by mouth daily at 6 PM.     nitroGLYCERIN 0.4 MG SL tablet  Commonly known as:  NITROSTAT  Place 1 tablet (0.4 mg total) under the tongue every 5 (five) minutes as needed for chest pain (CP or SOB).     ticagrelor 90 MG Tabs tablet  Commonly known as:  BRILINTA  Take 1 tablet (90 mg total) by mouth 2 (two) times daily.        Duration of Discharge Encounter   Greater than 30 minutes including physician time.  Signed, Bhagat,Bhavinkumar PA-C 10/07/2015, 2:04 PM    Personally seen and examined. Agree with above. He is a Building control surveyor Blood pressure still remains soft however he is asymptomatic. Hopefully over the next several days, this will improve. This is likely a result of right ventricular stunning.  Continue to hold beta blocker and ACE inhibitor. He is having asymptomatic hypotension. He walked with physical therapy and felt great.  Aggressive secondary risk factor prevention. High-dose statin.  He has been having some blood-streaked sputum which sometimes happens in dryer weather. We will try to get him some saline solution to help with this.  I'm comfortable with discharge later today, after lunch.  Close follow-up in 7-14 days. Dr. [name] or myself.  Candee Furbish, MD

## 2015-10-07 NOTE — Plan of Care (Signed)
Problem: Physical Regulation: Goal: Will remain free from infection Outcome: Completed/Met Date Met:  10/07/15 Pt educated on infection prevention.

## 2015-10-07 NOTE — Telephone Encounter (Signed)
New message      TCM appt on 10-12-15 with Truitt Merle

## 2015-10-07 NOTE — Discharge Instructions (Signed)
Radial Site Care Refer to this sheet in the next few weeks. These instructions provide you with information about caring for yourself after your procedure. Your health care provider may also give you more specific instructions. Your treatment has been planned according to current medical practices, but problems sometimes occur. Call your health care provider if you have any problems or questions after your procedure. WHAT TO EXPECT AFTER THE PROCEDURE After your procedure, it is typical to have the following:  Bruising at the radial site that usually fades within 1-2 weeks.  Blood collecting in the tissue (hematoma) that may be painful to the touch. It should usually decrease in size and tenderness within 1-2 weeks. HOME CARE INSTRUCTIONS  Take medicines only as directed by your health care provider.  You may shower 24-48 hours after the procedure or as directed by your health care provider. Remove the bandage (dressing) and gently wash the site with plain soap and water. Pat the area dry with a clean towel. Do not rub the site, because this may cause bleeding.  Do not take baths, swim, or use a hot tub until your health care provider approves.  Check your insertion site every day for redness, swelling, or drainage.  Do not apply powder or lotion to the site.  Do not flex or bend the affected arm for 24 hours or as directed by your health care provider.  Do not push or pull heavy objects with the affected arm for 24 hours or as directed by your health care provider.  Do not lift over 10 lb (4.5 kg) for 5 days after your procedure or as directed by your health care provider.  Ask your health care provider when it is okay to:  Return to work or school.  Resume usual physical activities or sports.  Resume sexual activity.  Do not drive home if you are discharged the same day as the procedure. Have someone else drive you.  You may drive 24 hours after the procedure unless otherwise  instructed by your health care provider.  Do not operate machinery or power tools for 24 hours after the procedure.  If your procedure was done as an outpatient procedure, which means that you went home the same day as your procedure, a responsible adult should be with you for the first 24 hours after you arrive home.  Keep all follow-up visits as directed by your health care provider. This is important. SEEK MEDICAL CARE IF:  You have a fever.  You have chills.  You have increased bleeding from the radial site. Hold pressure on the site. SEEK IMMEDIATE MEDICAL CARE IF:  You have unusual pain at the radial site.  You have redness, warmth, or swelling at the radial site.  You have drainage (other than a small amount of blood on the dressing) from the radial site.  The radial site is bleeding, and the bleeding does not stop after 30 minutes of holding steady pressure on the site.  Your arm or hand becomes pale, cool, tingly, or numb.   This information is not intended to replace advice given to you by your health care provider. Make sure you discuss any questions you have with your health care provider.   Document Released: 12/08/2010 Document Revised: 11/26/2014 Document Reviewed: 05/24/2014 Elsevier Interactive Patient Education 2016 [employer]  Ticagrelor oral tablet What is this medicine? TICAGRELOR (TYE ka GREL or) helps to prevent blood clots. This medicine is used to prevent heart attack, stroke, or other  vascular events in people who have had a recent heart attack or who have severe chest pain. This medicine may be used for other purposes; ask your health care provider or pharmacist if you have questions. What should I tell my health care provider before I take this medicine? They need to know if you have any of these conditions: -bleeding disorder -bleeding in the brain -liver disease -planned surgery -stomach or intestinal ulcers -stroke or transient ischemic  attack -an unusual or allergic reaction to ticagrelor, other medicines, foods, dyes, or preservatives -pregnant or trying to get pregnant -breast-feeding How should I use this medicine? Take this medicine by mouth with a glass of water. Follow the directions on the prescription label. You can take it with or without food. If it upsets your stomach, take it with food. Take your medicine at regular intervals. Do not take it more often than directed. Do not stop taking except on your doctor's advice. Talk to you pediatrician regarding the use of this medicine in children. Special care may be needed. Overdosage: If you think you have taken too much of this medicine contact a poison control center or emergency room at once. NOTE: This medicine is only for you. Do not share this medicine with others. What if I miss a dose? If you miss a dose, take it as soon as you can. If it is almost time for your next dose, take only that dose. Do not take double or extra doses. What may interact with this medicine? -certain antibiotics like clarithromycin and telithromycin -certain medicines for fungal infections like itraconazole, ketoconazole, and voriconazole -certain medicines for HIV infection like atazanavir, indinavir, nelfinavir, ritonavir, and saquinavir -certain medicines for seizures like carbamazepine, phenobarbital, and phenytoin -certain medicines that treat or prevent blood clots like warfarin -dexamethasone -digoxin -lovastatin -nefazodone -rifampin -simvastatin This list may not describe all possible interactions. Give your health care provider a list of all the medicines, herbs, non-prescription drugs, or dietary supplements you use. Also tell them if you smoke, drink alcohol, or use illegal drugs. Some items may interact with your medicine. What should I watch for while using this medicine? Visit your doctor or health care professional for regular check ups. Do not stop taking you medicine  unless your doctor tells you to. Notify your doctor or health care professional and seek emergency treatment if you develop breathing problems; changes in vision; chest pain; severe, sudden headache; pain, swelling, warmth in the leg; trouble speaking; sudden numbness or weakness of the face, arm, or leg. These can be signs that your condition has gotten worse. If you are going to have surgery or dental work, tell your doctor or health care professional that you are taking this medicine. You should take aspirin every day with this medicine. Do not take more than 100 mg each day. Talk to your doctor if you have questions. What side effects may I notice from receiving this medicine? Side effects that you should report to your doctor or health care professional as soon as possible: -allergic reactions like skin rash, itching or hives, swelling of the face, lips, or tongue -breathing problems -fast or irregular heartbeat -feeling faint or light-headed, falls -signs and symptoms of bleeding such as bloody or black, tarry stools; red or dark-brown urine; spitting up blood or brown material that looks like coffee grounds; red spots on the skin; unusual bruising or bleeding from the eye, gums, or nose Side effects that usually do not require medical attention (Report these to  your doctor or health care professional if they continue or are bothersome.): -breast enlargement in both males and females -diarrhea -dizziness -headache -tiredness -upset stomach This list may not describe all possible side effects. Call your doctor for medical advice about side effects. You may report side effects to FDA at 1-800-FDA-1088. Where should I keep my medicine? Keep out of the reach of children. Store at room temperature of 59 to 86 degrees F (15 to 30 degrees C). Throw away any unused medicine after the expiration date. NOTE: This sheet is a summary. It may not cover all possible information. If you have questions  about this medicine, talk to your doctor, pharmacist, or health care provider.    2016, Elsevier/Gold Standard. (2014-02-15 08:31:23)

## 2015-10-07 NOTE — Plan of Care (Signed)
Problem: Activity: Goal: Ability to return to baseline activity level will improve Outcome: Completed/Met Date Met:  10/07/15 Pt tolerated activity well and progressed adequately. Pt sleeping well and no complaints at this time.

## 2015-10-07 NOTE — Progress Notes (Signed)
Patient Name: Cory Sloan Date of Encounter: 10/07/2015   SUBJECTIVE  Continue to cough blood. Ambulated well with cardiac rehab yesterday. Denies cp, sob or palpations.   CURRENT MEDS . aspirin EC  81 mg Oral Daily  . atorvastatin  80 mg Oral q1800  . carvedilol  3.125 mg Oral BID WC  . sodium chloride  3 mL Intravenous Q12H  . ticagrelor  90 mg Oral BID    OBJECTIVE  Filed Vitals:   10/06/15 1420 10/06/15 2048 10/07/15 0427 10/07/15 0812  BP: 107/70 94/64 94/64  84/60  Pulse: 91 102 99   Temp: 99 F (37.2 C) 98.8 F (37.1 C) 99.1 F (37.3 C)   TempSrc: Oral Oral Oral   Resp: 18 18 18    Height:      Weight:   160 lb 14.4 oz (72.984 kg)   SpO2: 94% 93% 94%     Intake/Output Summary (Last 24 hours) at 10/07/15 0840 Last data filed at 10/06/15 2200  Gross per 24 hour  Intake    510 ml  Output      0 ml  Net    510 ml   Filed Weights   10/04/15 1610 10/04/15 1820 10/07/15 0427  Weight: 175 lb (79.379 kg) 160 lb 4.4 oz (72.7 kg) 160 lb 14.4 oz (72.984 kg)    PHYSICAL EXAM  General: Pleasant elderly male in  NAD. trach collar in place Neuro: Alert and oriented X 3. Moves all extremities spontaneously. Psych: Normal affect. HEENT:  Normal  Neck: Supple without bruits or JVD. Lungs:  Resp regular and unlabored, CTA. Heart: RRR no s3, s4, or murmurs. Abdomen: Soft, non-tender, non-distended, BS + x 4.  Extremities: No clubbing, cyanosis or edema. DP/PT/Radials 2+ and equal bilaterally.  Accessory Clinical Findings  CBC  Recent Labs  10/04/15 1614  10/06/15 0350 10/07/15 0545  WBC 16.2*  < > 14.2* 10.4  NEUTROABS 9.4*  --   --   --   HGB 15.3  < > 13.2 14.1  HCT 45.2  < > 39.8 42.7  MCV 89.0  < > 91.5 90.5  PLT 252  < > 200 196  < > = values in this interval not displayed. Basic Metabolic Panel  Recent Labs  10/04/15 1835 10/05/15 0210  10/06/15 0350 10/07/15 0545  NA 135 135  < > 138 135  K 3.1* 6.6*  < > 4.0 3.6  CL 102 105  < > 110  104  CO2 22 18*  < > 21* 24  GLUCOSE 140* 143*  < > 86 91  BUN 19 19  < > 18 14  CREATININE 1.29* 1.31*  < > 0.93 1.10  CALCIUM 8.3* 8.2*  < > 8.3* 8.2*  MG 1.8 1.9  --   --   --   < > = values in this interval not displayed. Liver Function Tests  Recent Labs  10/04/15 1614  AST 29  ALT 21  ALKPHOS 65  BILITOT 0.9  PROT 6.7  ALBUMIN 3.9   Cardiac Enzymes  Recent Labs  10/05/15 0210 10/05/15 0722 10/05/15 1310  TROPONINI >65.00* >65.00* >65.00*  Hemoglobin A1C  Recent Labs  10/05/15 0210  HGBA1C 5.8*   Fasting Lipid Panel  Recent Labs  10/05/15 0733  CHOL 200  HDL 58  LDLCALC 119*  TRIG 113  CHOLHDL 3.4   Thyroid Function Tests  Recent Labs  10/06/15 0350  TSH 10.168*    TELE  Sinus rhythm- rate  mostly in 80-90s. However elevated at 100s this morning.   Radiology/Studies  Dg Chest 2 View  09/29/2015  CLINICAL DATA:  Chest pain, intermittent for the past 4 months. History of throat carcinoma EXAM: CHEST  2 VIEW COMPARISON:  Apr 04, 2013 FINDINGS: There are probable nipple shadows bilaterally. No edema or consolidation. Heart size and pulmonary vascularity are normal. No adenopathy. No bone lesions. IMPRESSION: Probable nipple shadows bilaterally ; advise repeat study with nipple markers to confirm. No edema or consolidation. No adenopathy. Electronically Signed   By: Lowella Grip III M.D.   On: 09/29/2015 09:34   Ct Chest Wo Contrast  10/06/2015  CLINICAL DATA:  Shortness of breath with improvement on breathing treatments. Hemoptysis. EXAM: CT CHEST WITHOUT CONTRAST TECHNIQUE: Multidetector CT imaging of the chest was performed following the standard protocol without IV contrast. COMPARISON:  12/29/2009 FINDINGS: Lungs are adequately inflated with mild centrilobular emphysematous disease. There is mixed interstitial airspace opacification over the posterior segment upper lobes bilaterally likely infection. There are small bilateral pleural  effusions with associated mild atelectatic change. Airways are within normal. Heart is normal size. There is minimal calcified plaque over the proximal left anterior descending and lateral circumflex coronaries as well as the right coronary artery. Ectasia of the ascending thoracic aorta measuring 3.9 cm in AP diameter (previously 3.6 cm). No hilar, mediastinal or axillary adenopathy. Remaining mediastinal structures are within normal. Postsurgical changes of the trachea at the level of the sternal notch. Images through the upper abdomen are within normal. There mild degenerate changes of the spine and shoulders. IMPRESSION: Emphysematous disease with new mixed interstitial airspace process over the posterior segment upper lobe bilaterally likely infection. Small bilateral pleural effusions with bibasilar atelectasis. Mild atherosclerotic coronary artery disease. Ectasia of the ascending thoracic aorta measuring 3.9 cm (previously 3.6 cm). Recommend annual imaging followup by CTA or MRA. This recommendation follows 2010 ACCF/AHA/AATS/ACR/ASA/SCA/SCAI/SIR/STS/SVM Guidelines for the Diagnosis and Management of Patients with Thoracic Aortic Disease. Circulation.2010; [id number]. Electronically Signed   By: Marin Olp M.D.   On: 10/06/2015 09:56   Dg Chest Port 1 View  10/04/2015  CLINICAL DATA:  Chest pain shortness of breath 1 day. EXAM: PORTABLE CHEST 1 VIEW COMPARISON:  09/29/2015 FINDINGS: Lordotic technique demonstrated. Lungs are adequately inflated without consolidation or effusion. Cardiomediastinal silhouette is within normal. There are mild degenerative changes of the spine and shoulders. IMPRESSION: No active disease. Electronically Signed   By: Marin Olp M.D.   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  Hemoptysis: Continued to cough up tiny blood. No signs of PNA on CXR a few days ago. Hb 14 today, actually increased from yesterday which is reassuring.  -Discontinued heparain and will encourage mobilization out of bed today. -Chest CT showed Emphysematous disease with new mixed interstitial airspace process over the posterior segment upper lobe bilaterally likely infection. Small bilateral pleural effusions with bibasilar atelectasis. However WBC back to normal today. Management per PCCM.   Cardiogenic shock: 2/2 RCA stunning though now resolved and off IV fluids. BP 80s-90s/60s-70s, HR 80-90s that increased to 100s this morning. Held Coreg this morning.   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 TSH 10 though suspect it may be sequelae of his XRT and laryngectomy as he denied any changes in his functional status in the preceding weeks. A1c 5.8. -Continue ASA, Brillinta -Never received Coreg 3.125 twice daily (held today) and defer ACEi pending improvement in shock/hypotension -Continue Lipitor 80mg   Hyperlipidemia: Total cholesterol 200, triglycerides 113, HDL 58, LDL 119.  -Lipitor as noted above  Acute kidney injury:  - Resolved   Signed, Bhagat,Bhavinkumar PA-C Pager [phone]  Personally seen and examined. Agree with above. He is a Building control surveyor Blood pressure still remains soft however he is asymptomatic. Hopefully over the next several days, this will improve. This is likely a result of right ventricular stunning.  Continue to hold beta blocker and ACE inhibitor. He is having asymptomatic hypotension. He walked with physical therapy and felt great.  Aggressive secondary risk factor prevention. High-dose statin.  He has been having some  blood-streaked sputum which sometimes happens in dryer weather. We will try to get him some saline solution to help with this.  I'm comfortable with discharge later today, after lunch.  Close follow-up in 7-14 days. Dr. [name] or myself.  Candee Furbish, MD

## 2015-10-07 NOTE — Plan of Care (Signed)
Problem: Fluid Volume: Goal: Ability to maintain a balanced intake and output will improve Outcome: Completed/Met Date Met:  10/07/15 Pt maintained adequate intake and output.

## 2015-10-07 NOTE — Progress Notes (Signed)
1030 Came to see pt to walk. Washing up. Pt stated he had walked unit earlier without CP and will walk again with wife later. Education has been completed so will sign off as pt walking independently and stated might go home today. Graylon Good RN BSN 10/07/2015 10:33 AM

## 2015-10-08 ENCOUNTER — Emergency Department (HOSPITAL_COMMUNITY): Payer: Medicare Other

## 2015-10-08 ENCOUNTER — Emergency Department (HOSPITAL_COMMUNITY)
Admission: EM | Admit: 2015-10-08 | Discharge: 2015-10-08 | Disposition: A | Discharge status: Home / Self Care | Payer: Medicare Other | Attending: Emergency Medicine | Admitting: Emergency Medicine

## 2015-10-08 ENCOUNTER — Encounter (HOSPITAL_COMMUNITY): Payer: Self-pay | Admitting: Emergency Medicine

## 2015-10-08 DIAGNOSIS — Z85818 Personal history of malignant neoplasm of other sites of lip, oral cavity, and pharynx: Secondary | ICD-10-CM | POA: Insufficient documentation

## 2015-10-08 DIAGNOSIS — Z7982 Long term (current) use of aspirin: Secondary | ICD-10-CM | POA: Insufficient documentation

## 2015-10-08 DIAGNOSIS — Z7902 Long term (current) use of antithrombotics/antiplatelets: Secondary | ICD-10-CM | POA: Insufficient documentation

## 2015-10-08 DIAGNOSIS — I809 Phlebitis and thrombophlebitis of unspecified site: Secondary | ICD-10-CM | POA: Insufficient documentation

## 2015-10-08 DIAGNOSIS — M79602 Pain in left arm: Secondary | ICD-10-CM | POA: Diagnosis present

## 2015-10-08 DIAGNOSIS — Z79899 Other long term (current) drug therapy: Secondary | ICD-10-CM | POA: Insufficient documentation

## 2015-10-08 NOTE — Discharge Instructions (Signed)
Phlebitis  Phlebitis is soreness and swelling (inflammation) of a vein. This can occur in your arms, legs, or torso (trunk), as well as deeper inside your body. Phlebitis is usually not serious when it occurs close to the surface of the body. However, it can cause serious problems when it occurs in a vein deeper inside the body.  CAUSES   Phlebitis can be triggered by various things, including:    Reduced blood flow through your veins. This can happen with:    Bed rest over a long period.    Long-distance travel.    Injury.    Surgery.    Being overweight (obese) or pregnant.   Having an IV tube put in the vein and getting certain medicines through the vein.   Cancer and cancer treatment.   Use of illegal drugs taken through the vein.   Inflammatory diseases.   Inherited (genetic) diseases that increase the risk of blood clots.   Hormone therapy, such as birth control pills.  SIGNS AND SYMPTOMS    Red, tender, swollen, and painful area on your skin. Usually, the area will be long and narrow.   Firmness along the center of the affected area. This can indicate that a blood clot has formed.   Low-grade fever.  DIAGNOSIS   A health care provider can usually diagnose phlebitis by examining the affected area and asking about your symptoms. To check for infection or blood clots, your health care provider may order blood tests or an ultrasound exam of the area. Blood tests and your family history may also indicate if you have an underlying genetic disease that causes blood clots. Occasionally, a piece of tissue is taken from the body (biopsy sample) if an unusual cause of phlebitis is suspected.  TREATMENT   Treatment will vary depending on the severity of the condition and the area of the body affected. Treatment may include:   Use of a warm compress or heating pad.   Use of compression stockings or bandages.   Anti-inflammatory medicines.   Removal of any IV tube that may be causing the problem.   Medicines  that kill germs (antibiotics) if an infection is present.   Blood-thinning medicines if a blood clot is suspected or present.   In rare cases, surgery may be needed to remove damaged sections of vein.  HOME CARE INSTRUCTIONS    Only take over-the-counter or prescription medicines as directed by your health care provider. Take all medicines exactly as prescribed.   Raise (elevate) the affected area above the level of your heart as directed by your health care provider.   Apply a warm compress or heating pad to the affected area as directed by your health care provider. Do not sleep with the heating pad.   Use compression stockings or bandages as directed. These will speed healing and prevent the condition from coming back.   If you are on blood thinners:    Get follow-up blood tests as directed by your health care provider.    Check with your health care provider before using any new medicines.    Carry a medical alert card or wear your medical alert jewelry to show that you are on blood thinners.   For phlebitis in the legs:    Avoid prolonged standing or bed rest.    Keep your legs moving. Raise your legs when sitting or lying.   Do not smoke.   Women, particularly those over the age of 35, should consider   the risks and benefits of taking the contraceptive pill. This kind of hormone treatment can increase your risk for blood clots.   Follow up with your health care provider as directed.  SEEK MEDICAL CARE IF:    You have unusual bruising or any bleeding problems.   Your swelling or pain in the affected area is not improving.   You are on anti-inflammatory medicine, and you develop belly (abdominal) pain.  SEEK IMMEDIATE MEDICAL CARE IF:    You have a sudden onset of chest pain or difficulty breathing.   You have a fever or persistent symptoms for more than 2-3 days.   You have a fever and your symptoms suddenly get worse.  MAKE SURE YOU:   Understand these instructions.   Will watch your  condition.   Will get help right away if you are not doing well or get worse.     This information is not intended to replace advice given to you by your health care provider. Make sure you discuss any questions you have with your health care provider.     Document Released: 10/30/2001 Document Revised: 08/26/2013 Document Reviewed: 07/13/2013  Elsevier Interactive Patient Education 2016 [employer]

## 2015-10-08 NOTE — ED Provider Notes (Signed)
CSN: [id number]     Arrival date & time 10/08/15  1935 History   First MD Initiated Contact with Patient 10/08/15 1941     Chief Complaint  Patient presents with  . Arm Pain     (Consider location/radiation/quality/duration/timing/severity/associated sxs/prior Treatment) HPI Comments: Patient presents to the emergency department with chief complaint of left arm pain. He states that he noticed some swelling near a recent Angiocath site in his left antecubital fossa. He reports increased pain. He is concerned about injury, stating that when the needle came out the needle tip was bent. Patient denies any other additional symptoms. He denies any fevers chills. It is aggravated with palpation and movement. He has not tried taking anything to alleviate the symptoms.  The history is provided by the patient. No language interpreter was used.    Past Medical History  Diagnosis Date  . 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 Appendectomy    . Throat surgery    . Cardiac catheterization N/A 10/04/2015    Procedure: Left Heart Cath and Coronary Angiography;  Surgeon: Troy Sine, MD;  Location: [facility];  Service: Cardiovascular;  Laterality: N/A;  . Cardiac catheterization N/A 10/04/2015    Procedure: Coronary Stent Intervention;  Surgeon: Troy Sine, MD;  Location: [place] [facility];  Service: Cardiovascular;  Laterality: N/A;   No family history on file. Social History  Substance Use Topics  . Smoking status: Never Smoker   . Smokeless tobacco: None  . Alcohol Use: No    Review of Systems  Constitutional: Negative for fever and chills.  Respiratory: Negative for shortness of breath.   Cardiovascular: Negative for chest pain.  Gastrointestinal: Negative for nausea, vomiting, diarrhea and constipation.  Genitourinary: Negative for dysuria.      Allergies  Review of patient's allergies indicates no known allergies.  Home  Medications   Prior to Admission medications   Medication Sig Start Date End Date Taking? Authorizing Provider  albuterol (PROVENTIL HFA;VENTOLIN HFA) 108 (90 BASE) MCG/ACT inhaler Inhale 1-2 puffs into the lungs every 4 (four) hours as needed for wheezing or shortness of breath (or coughing). 09/29/15   Merrily Pew, MD  aspirin EC 81 MG EC tablet Take 1 tablet (81 mg total) by mouth daily. 10/07/15   Bhavinkumar Bhagat, PA  atorvastatin (LIPITOR) 80 MG tablet Take 1 tablet (80 mg total) by mouth daily at 6 PM. 10/07/15   Bhavinkumar Bhagat, PA  nitroGLYCERIN (NITROSTAT) 0.4 MG SL tablet Place 1 tablet (0.4 mg total) under the tongue every 5 (five) minutes as needed for chest pain (CP or SOB). 10/07/15   Leanor Kail, PA  ticagrelor (BRILINTA) 90 MG TABS tablet Take 1 tablet (90 mg total) by mouth 2 (two) times daily. 10/07/15   Bhavinkumar Bhagat, PA   BP 93/63 mmHg  Pulse 86  Temp(Src) 98.5 F (36.9 C) (Oral)  Resp 16  SpO2 96% Physical Exam  Constitutional: He is oriented to person, place, and time. He appears well-developed and well-nourished.  HENT:  Head: Normocephalic and atraumatic.  Eyes: Conjunctivae and EOM are normal. Pupils are equal, round, and reactive to light. Right eye exhibits no discharge. Left eye exhibits no discharge. No scleral icterus.  Neck: Normal range of motion. Neck supple. No JVD present.  Cardiovascular: Normal rate, regular rhythm and normal heart sounds.  Exam reveals no gallop and no friction rub.   No murmur heard. Pulmonary/Chest: Effort normal and  breath sounds normal. No respiratory distress. He has no wheezes. He has no rales. He exhibits no tenderness.  Abdominal: Soft. He exhibits no distension and no mass. There is no tenderness. There is no rebound and no guarding.  Musculoskeletal: Normal range of motion. He exhibits no edema or tenderness.  Neurological: He is alert and oriented to person, place, and time.  Skin: Skin is warm and dry.   Mild erythema near recent Angiocath, appears consistent with phlebitis  Psychiatric: He has a normal mood and affect. His behavior is normal. Judgment and thought content normal.  Nursing note and vitals reviewed.   ED Course  Procedures (including critical care time) Labs Review Labs Reviewed - No data to display  Imaging Review Dg Chest Port 1 View  10/07/2015  CLINICAL DATA:  Hemoptysis EXAM: PORTABLE CHEST - 1 VIEW COMPARISON:  10/06/2015 FINDINGS: Cardiac shadow is within normal limits. The lungs are well aerated bilaterally. The focal infiltrative changes in the upper lobes bilaterally are less well appreciated on today's exam. The previously seen pleural effusions are also not well visualized. IMPRESSION: No acute abnormality noted. Electronically Signed   By: [name] M.D.   On: 10/07/2015 12:40   I have personally reviewed and evaluated these images and lab results as part of my medical decision-making.   EKG Interpretation None      MDM   Final diagnoses:  Phlebitis    Patient with symptoms consistent with phlebitis, however he did state that the needle tip was bent when I came out of his arm. No evidence of abscess or fluid collection.  He is concerned about trauma. Will check plain films. Anticipate treating with warm compresses.  Plain films negative.   Patient discussed with Dr. Darl Householder.  Strict return precautions given. BP is baseline per family.   Montine Circle, PA-C 10/08/15 2113  Wandra Arthurs, MD 10/08/15 [phone]

## 2015-10-08 NOTE — ED Notes (Signed)
Pt transported to xray 

## 2015-10-08 NOTE — ED Notes (Signed)
Pt. reports pain with mild swelling at left antecubital area onset this evening , pt. stated pain is where he had venipuncture in his previous admission at hospital .

## 2015-10-10 LAB — CULTURE, BLOOD (ROUTINE X 2)
CULTURE: NO GROWTH
Culture: NO GROWTH

## 2015-10-10 NOTE — Telephone Encounter (Signed)
Patient contacted regarding discharge from [facility] on 11/18/20176.   Patient understands to follow up with Truitt Merle, APP on 10/02/15 at [facility] office. Patient understands discharge instructions? Yes Patient understands medications and regiment? Yes Patient understands to bring all medications to this visit? Yes  Wife took him to ED on Saturday.  She was afraid he had a blood clot in his right arm.  Was diagnosed with phlebitis; applying warm compresses.  The area looks better according to his wife.    He also has a "golf ball sized bruise" on his abdomen; he stated he was given a shot in that area.  It is unchanged and soft.  Advised to monitor for increased size or if it becomes firm to call back.

## 2015-10-11 ENCOUNTER — Telehealth: Payer: Self-pay | Admitting: Cardiovascular Disease

## 2015-10-11 NOTE — Telephone Encounter (Signed)
New Message  Pt c/o of Chest Pain: STAT if CP now or developed within 24 hours  1. Are you having CP right now? No- only when taking deep breathes   2. Are you experiencing any other symptoms (ex. SOB, nausea, vomiting, sweating)? no  3. How long have you been experiencing CP? 3 am this am- 11/22  4. Is your CP continuous or coming and going? w/ deep breathes   5. Have you taken Nitroglycerin? no ?

## 2015-10-11 NOTE — Telephone Encounter (Signed)
Discussed with dr [name], Spoke with pt wife, do not think it is related to his heart. He has a post hospital f/u tomorrow with lori gerhardt np. They will call back today if symptoms change or they become concerned.

## 2015-10-11 NOTE — Telephone Encounter (Signed)
Spoke with pt wife, pt woke this am with chest pain. It occurs only when he takes a deep breath and goes down the left side. The pain is sharpe and does not last long. He denies SOB or other symptoms, he is just tired. This is not like the pain he had with his recent MI. Will discuss with dr [name].

## 2015-10-12 ENCOUNTER — Ambulatory Visit (INDEPENDENT_AMBULATORY_CARE_PROVIDER_SITE_OTHER): Payer: Medicare Other | Admitting: Nurse Practitioner

## 2015-10-12 ENCOUNTER — Encounter: Payer: Self-pay | Admitting: Nurse Practitioner

## 2015-10-12 VITALS — BP 104/80 | HR 95 | Ht 71.0 in | Wt 164.8 lb

## 2015-10-12 DIAGNOSIS — I213 ST elevation (STEMI) myocardial infarction of unspecified site: Secondary | ICD-10-CM | POA: Diagnosis not present

## 2015-10-12 DIAGNOSIS — R570 Cardiogenic shock: Secondary | ICD-10-CM | POA: Diagnosis not present

## 2015-10-12 DIAGNOSIS — E785 Hyperlipidemia, unspecified: Secondary | ICD-10-CM

## 2015-10-12 LAB — HEPATIC FUNCTION PANEL
ALT: 16 U/L (ref 9–46)
AST: 16 U/L (ref 10–35)
Albumin: 3.5 g/dL — ABNORMAL LOW (ref 3.6–5.1)
Alkaline Phosphatase: 79 U/L (ref 40–115)
Bilirubin, Direct: 0.1 mg/dL (ref <=0.2)
Indirect Bilirubin: 0.6 mg/dL (ref 0.2–1.2)
Total Bilirubin: 0.7 mg/dL (ref 0.2–1.2)
Total Protein: 6.6 g/dL (ref 6.1–8.1)

## 2015-10-12 LAB — CBC
HCT: 40.5 % (ref 39.0–52.0)
Hemoglobin: 14.5 g/dL (ref 13.0–17.0)
MCH: 30.9 pg (ref 26.0–34.0)
MCHC: 35.8 g/dL (ref 30.0–36.0)
MCV: 86.4 fL (ref 78.0–100.0)
MPV: 11.1 fL (ref 8.6–12.4)
Platelets: 292 K/uL (ref 150–400)
RBC: 4.69 MIL/uL (ref 4.22–5.81)
RDW: 13 % (ref 11.5–15.5)
WBC: 10.9 K/uL — ABNORMAL HIGH (ref 4.0–10.5)

## 2015-10-12 LAB — BASIC METABOLIC PANEL WITH GFR
BUN: 18 mg/dL (ref 7–25)
CO2: 25 mmol/L (ref 20–31)
Calcium: 8.8 mg/dL (ref 8.6–10.3)
Chloride: 101 mmol/L (ref 98–110)
Creat: 0.94 mg/dL (ref 0.70–1.25)
Glucose, Bld: 75 mg/dL (ref 65–99)
Potassium: 4.6 mmol/L (ref 3.5–5.3)
Sodium: 136 mmol/L (ref 135–146)

## 2015-10-12 LAB — TSH: TSH: 4.536 u[IU]/mL — ABNORMAL HIGH (ref 0.350–4.500)

## 2015-10-12 NOTE — Patient Instructions (Addendum)
We will be checking the following labs today - BMET, CBC, TSH, & HPF   Medication Instructions:    Continue with your current medicines.     Testing/Procedures To Be Arranged:  Echocardiogram  Follow-Up:   See me in 2 weeks with fasting labs  Needs to get his pulmonary visit moved up - try for next week - for hemoptysis - see Patricia Nettle, NP    Other Special Instructions:   May increase your walking gradually  Restrict salt    If you need a refill on your cardiac medications before your next appointment, please call your pharmacy.   Call the [place] office at [phone] if you have any questions, problems or concerns.

## 2015-10-12 NOTE — Progress Notes (Signed)
CARDIOLOGY OFFICE NOTE  Date:  10/12/2015    Leanord Sloan Date of Birth: [birth year] Medical Record [id number]  PCP:  [facility]  Cardiologist:  [name]    Chief Complaint  Patient presents with  . Coronary Artery Disease    Post MI - seen for Dr. [name]    History of Present Illness: Cory Sloan is a 69 y.o. male who presents today for a TOC/post hospital visit. Seen for Dr. [name].   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   He presented 10/04/15 with several weeks of ongoing chest pain to the ED. He was seen in ED on 11/10 and discharged on prednisone and albuterol for an acute COPD exacerbation. Troponins were not elevated at that time x 2. Per ED, pain worsened approximately 90 minutes to presentation on 10/04/2015 and initial EKG showed ST elevations in the inferior leads with reciprocal ST changes in avR, avL. He was taken for emergent cath.   Cath showed complete proximal RCA occlusion s/p DES to the proximal to mid RCA. Post cath patient developed cardiogenic shock and hypotension. Which was refractory to IV fluid resuscitation initially . He was started on dopamine and PCCM consulted for elevated lactic acid level of 4.6. Symptoms improved with fluid resuscitation and was weaned off dopamine. Renal function remained stable after fluid resuscitation with continued improvement in his hyperkalemia (pesudohyperkalemia) with resolution after kayexalate tx. He complained of pink-tinged sputum/ mild hemoptysis. Chest CT showed Emphysematous disease with new mixed interstitial airspace process over the posterior segment upper lobe bilaterally likely infection. Small bilateral pleural effusions with bibasilar atelectasis. However resolution of leucocytosis. Hemoptysis likely due to IV heparin + dryness in air + baseline tacheal stoma.  Lactic acidosis felt likely secondary to acute myocardial infarction.   Unable to start beta blocker and ACE inhibitor due to asymptomatic hypotension.  Continue Brilinta, ASA and lipitor 80mg . TSH was elevated this admission likely due to acute illness.   Comes in today. Here with his wife. Went to the wrong office. Thinks he is doing ok. No real problems.  Had had some pain in his knees and it goes away with walking. Woke up yesterday with pleuritic chest pain - now resolved. No heart pain. Not short of breath. Does deal with chronic phlegm production. No swelling. Did have hemoptysis during this admission - this has improved but he still has. No longer on prednisone or antibiotics. To see pulmonary but not until January. Did not have a PCP prior - will be establishing at [facility] out in [place].   Past Medical History  Diagnosis Date  . 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 Tobacco abuse   . COPD (chronic obstructive pulmonary disease) ([place])   . STEMI (ST elevation myocardial infarction) [facility]) November 2016    with PCI to RCA and associated cardiogenic shock  . HLD (hyperlipidemia)     Past Surgical History  Procedure Laterality Date  . Appendectomy    . Throat surgery  2005  . Cardiac catheterization N/A 10/04/2015    Procedure: Left Heart Cath and Coronary Angiography;  Surgeon: Troy Sine, MD;  Location: [facility];  Service: Cardiovascular;  Laterality: N/A;  . Cardiac catheterization N/A 10/04/2015    Procedure: Coronary Stent  Intervention;  Surgeon: Troy Sine, MD;  Location: [facility];  Service: Cardiovascular;  Laterality: N/A;     Medications: Current Outpatient Prescriptions  Medication Sig Dispense Refill  . albuterol (PROVENTIL HFA;VENTOLIN HFA) 108 (90 BASE) MCG/ACT inhaler Inhale 1-2 puffs into the lungs every 4 (four) hours as needed for wheezing or shortness of breath (or  coughing). 1 Inhaler 0  . aspirin EC 81 MG EC tablet Take 1 tablet (81 mg total) by mouth daily.    Marland Kitchen atorvastatin (LIPITOR) 80 MG tablet Take 1 tablet (80 mg total) by mouth daily at 6 PM. 30 tablet 11  . nitroGLYCERIN (NITROSTAT) 0.4 MG SL tablet Place 1 tablet (0.4 mg total) under the tongue every 5 (five) minutes as needed for chest pain (CP or SOB). 25 tablet 12  . ticagrelor (BRILINTA) 90 MG TABS tablet Take 1 tablet (90 mg total) by mouth 2 (two) times daily. 60 tablet 11   No current facility-administered medications for this visit.    Allergies: No Known Allergies  Social History: The patient  reports that he has quit smoking. He does not have any smokeless tobacco history on file. He reports that he does not drink alcohol or use illicit drugs.   Family History: The patient's family history includes Brain cancer in his father; CAD in his brother; Cancer - Other in his mother. He has 7 brothers - 3 have had CABG and 1 deceased from cancer. He has 3 sisters - one died with cancer.   Review of Systems: Please see the history of present illness.   Otherwise, the review of systems is positive for none.   All other systems are reviewed and negative.   Physical Exam: VS:  BP 104/80 mmHg  Pulse 95  Ht 5\' 11"  (1.803 m)  Wt 164 lb 12.8 oz (74.753 kg)  BMI 23.00 kg/m2 .  BMI Body mass index is 23 kg/(m^2).  Wt Readings from Last 3 Encounters:  10/12/15 164 lb 12.8 oz (74.753 kg)  10/07/15 160 lb 14.4 oz (72.984 kg)    General: Pleasant. He has trach stoma noted. He uses a voice box to communicate. He is in no acute distress.  HEENT: Normal. Neck: Supple, no JVD, carotid bruits, or masses noted.  Cardiac: Regular rate and rhythm. No murmurs, rubs, or gallops. No edema.  Respiratory:  Lungs are clear but decreased. No wheezing. He is coughing - blood tinged sputum GI: Soft and nontender.  MS: No deformity or atrophy. Gait and ROM intact. Skin: Warm and dry. Color is normal.    Neuro:  Strength and sensation are intact and no gross focal deficits noted.  Psych: Alert, appropriate and with normal affect.   LABORATORY DATA:  EKG:  EKG is ordered today. This shows NSR. Inferior/posterior MI. Evolving T wave changes anterior and lateral.   Lab Results  Component Value Date   WBC 10.4 10/07/2015   HGB 14.1 10/07/2015   HCT 42.7 10/07/2015   PLT 196 10/07/2015   GLUCOSE 91 10/07/2015   CHOL 200 10/05/2015   TRIG 113 10/05/2015   HDL 58 10/05/2015   LDLCALC 119* 10/05/2015   ALT 21 10/04/2015   AST 29 10/04/2015   NA 135 10/07/2015   K 3.6 10/07/2015   CL 104 10/07/2015   CREATININE 1.10 10/07/2015   BUN 14 10/07/2015   CO2 24 10/07/2015   TSH 10.168* 10/06/2015   INR 1.05 10/06/2015   HGBA1C 5.8* 10/05/2015    BNP (last  3 results) No results for input(s): BNP in the last 8760 hours.  ProBNP (last 3 results) No results for input(s): PROBNP in the last 8760 hours.   Other Studies Reviewed Today: Left Heart Cath and Coronary Angiography    Conclusion     Mid LAD to Dist LAD lesion, 15% stenosed.  Prox RCA to Mid RCA lesion, 100% stenosed. Post intervention, there is a 0% residual stenosis.  There is mild left ventricular systolic dysfunction.  Acute ST segment elevation myocardial infarction secondary to total proximal occlusion of a large dominant RCA.  Mild luminal irregularities of the LAD without significant obstructive disease.  Normal left circumflex coronary artery.  Mild acute LV dysfunction with an ejection fraction of 50% with mild mid inferior wall hypocontractility.  Successful PCI/DES stenting of a large dominant RCA with ultimate insertion of a 4.032 mm Synergy DES stent postdilated to 4.6 mm 100% occlusion reduced to 0% and restoration of brisk TIMI-3 flow without evidence for dissection.  RECOMMENDATION: The patient will be maintained on dual antiplatelet therapy. He will be vigorously hydrated due to his large RCA  and potential for RV infarction. I would anticipate good recovery of LV function. Once his blood pressure allows he will be started on beta blocker, ACE inhibitor with high potency statin therapy.       Left Heart    Left Ventricle The left ventricular size is normal. There is mild left ventricular systolic dysfunction. The left ventricular ejection fraction is 45-50% by visual estimate. There are wall motion abnormalities in the left ventricle. Very mild inferior hypocontractility with an overall ejection fraction of 50%.     Assessment/Plan: 1. STEMI with PCI to the RCA - associated with cardiogenic shock - EF of 45%. Needs echo. Doing ok. BP too soft to add additional medicines. Will check labs. See back in 2 weeks and hope to add either beta blocker/ACE. Ok to increase his walking. Consider cardiac rehab on return. Continues on DAPT therapy with no problem noted.   2. Laryngeal cancer  3. Past smoker  4. COPD - continues to have hemoptysis - says this is improving since discharge. Will try to go ahead and get him seen by pulmonary next week and not wait til January.   5. HLD - will need labs on return.   Current medicines are reviewed with the patient today.  The patient does not have concerns regarding medicines other than what has been noted above.  The following changes have been made:  See above.  Labs/ tests ordered today include:    Orders Placed This Encounter  Procedures  . Basic metabolic panel  . CBC  . Hepatic function panel  . TSH  . EKG 12-Lead  . ECHOCARDIOGRAM COMPLETE     Disposition:   FU with me in 2 weeks. Will get him back with Dr. [name] when available (not until February). Recheck fasting labs on return.    Patient is agreeable to this plan and will call if any problems develop in the interim.   Signed: Burtis Junes, RN, ANP-C 10/12/2015 10:54 AM  [facility] [address] [place], [place]   [address] Phone: [phone] Fax: [phone]

## 2015-10-14 ENCOUNTER — Ambulatory Visit (INDEPENDENT_AMBULATORY_CARE_PROVIDER_SITE_OTHER): Payer: Medicare Other | Admitting: Internal Medicine

## 2015-10-14 ENCOUNTER — Encounter: Payer: Self-pay | Admitting: Internal Medicine

## 2015-10-14 VITALS — BP 90/64 | HR 85 | Ht 71.0 in | Wt 162.0 lb

## 2015-10-14 DIAGNOSIS — C329 Malignant neoplasm of larynx, unspecified: Secondary | ICD-10-CM

## 2015-10-14 DIAGNOSIS — R042 Hemoptysis: Secondary | ICD-10-CM

## 2015-10-14 DIAGNOSIS — J189 Pneumonia, unspecified organism: Secondary | ICD-10-CM | POA: Diagnosis not present

## 2015-10-14 MED ORDER — BENZONATATE 200 MG PO CAPS: 200.0000 mg | ORAL_CAPSULE | Freq: Three times a day (TID) | ORAL | Status: DC | PRN

## 2015-10-14 MED ORDER — DOXYCYCLINE HYCLATE 100 MG PO TABS: 100.0000 mg | ORAL_TABLET | Freq: Two times a day (BID) | ORAL | Status: DC

## 2015-10-14 NOTE — Assessment & Plan Note (Signed)
Previous small amounts of blood consistent with drying irritation in trachea after past history of tracheostomy and XRT for laryngeal cancer. Chest x-ray now shows upper zone posteriorly infiltrates although exam is clear so there may be some partly treated pneumonia or blood aspiration. He has been treated for bronchitis before his MI. Antiplatelet therapy with aspirin and Brilinta likely magnifies bleeding. Plan-doxycycline, benzonatate for cough, reduced Brilinta to 1 daily for 3 days only then resume twice daily therapy. Make appointment with Dr.Wolicki/ ENT to follow-up with possible visualization through the tracheostomy tube.

## 2015-10-14 NOTE — Assessment & Plan Note (Signed)
Followed by ENT with no known recurrence after laryngectomy/tracheostomy and XRT

## 2015-10-14 NOTE — Patient Instructions (Signed)
Call this morning to make an appointment with Dr Erik Obey and to ask him to refill the saline bullets  Suggest you reduce Brilinta to 1 daily through the weekend, then go back to twice daily.  Scripts sent for doxycycline antibiotic and for benzonatate perles for cough

## 2015-10-14 NOTE — Progress Notes (Signed)
10/14/2015 69 year old male former smoker Referred by Truitt Merle. Hemoptysis started around 10/04/15 - Light to Bright red, mixed with mucus, about 3-4 Tbsp of blood a day.  CT, cxr and labs while in hospital for heart attack. Medical history CAD/MI, COPD, laryngeal cancer, hemoptysis On ASA 74, Brilinta              wife here Has noted specks of blood in mucus from his tracheostomy for many years, usually associated with dryness. Finished Z-Pak and prednisone on 10/03/2015 for bronchitis. Acute MI 10/04/2015-started on Brilinta. Sputum had changed from yellow to white. Began noticing more blood and in the last few days has been having teaspoon hemoptysis 2 or 3 times daily. Feels mild chest congestion. Denies chest pain, fever, other bleeding, adenopathy. CXR 10/07/15 FINDINGS: Cardiac shadow is within normal limits. The lungs are well aerated bilaterally. The focal infiltrative changes in the upper lobes bilaterally are less well appreciated on today's exam. The previously seen pleural effusions are also not well visualized. IMPRESSION: No acute abnormality noted. Electronically Signed  By: [name] M.D.  On: 10/07/2015 12:40  CT chest 10/06/2015 IMPRESSION: Emphysematous disease with new mixed interstitial airspace process over the posterior segment upper lobe bilaterally likely infection. Small bilateral pleural effusions with bibasilar atelectasis. Mild atherosclerotic coronary artery disease. Ectasia of the ascending thoracic aorta measuring 3.9 cm (previously 3.6 cm). Recommend annual imaging followup by CTA or MRA. This recommendation follows 2010 ACCF/AHA/AATS/ACR/ASA/SCA/SCAI/SIR/STS/SVM Guidelines for the Diagnosis and Management of Patients with Thoracic Aortic Disease. Circulation.2010; 121ZK:5694362. Electronically Signed  By: Marin Olp M.D.  On: 10/06/2015 09:56  Prior to Admission medications   Medication Sig Start Date End Date Taking? Authorizing  Provider  albuterol (PROVENTIL HFA;VENTOLIN HFA) 108 (90 BASE) MCG/ACT inhaler Inhale 1-2 puffs into the lungs every 4 (four) hours as needed for wheezing or shortness of breath (or coughing). 09/29/15  Yes Merrily Pew, MD  aspirin EC 81 MG EC tablet Take 1 tablet (81 mg total) by mouth daily. 10/07/15  Yes Bhavinkumar Bhagat, PA  atorvastatin (LIPITOR) 80 MG tablet Take 1 tablet (80 mg total) by mouth daily at 6 PM. 10/07/15  Yes Bhavinkumar Bhagat, PA  nitroGLYCERIN (NITROSTAT) 0.4 MG SL tablet Place 1 tablet (0.4 mg total) under the tongue every 5 (five) minutes as needed for chest pain (CP or SOB). 10/07/15  Yes Bhavinkumar Bhagat, PA  ticagrelor (BRILINTA) 90 MG TABS tablet Take 1 tablet (90 mg total) by mouth 2 (two) times daily. 10/07/15  Yes Bhavinkumar Bhagat, PA  benzonatate (TESSALON) 200 MG capsule Take 1 capsule (200 mg total) by mouth 3 (three) times daily as needed for cough. 10/14/15   Deneise Lever, MD  doxycycline (VIBRA-TABS) 100 MG tablet Take 1 tablet (100 mg total) by mouth 2 (two) times daily. 10/14/15   Deneise Lever, MD   Past Medical History  Diagnosis Date  . 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 Tobacco abuse   . COPD (chronic obstructive pulmonary disease) ([place])   . STEMI (ST elevation myocardial infarction) [facility]) November 2016    with PCI to RCA and associated cardiogenic shock  . HLD (hyperlipidemia)    Past Surgical History  Procedure Laterality Date  . Appendectomy    . Throat surgery  2005    [place]  . Cardiac catheterization N/A 10/04/2015    Procedure: Left Heart Cath and Coronary Angiography;  Surgeon: Marcello Moores  Floyce Stakes, MD;  Location: [facility];  Service: Cardiovascular;  Laterality: N/A;  . Cardiac catheterization N/A 10/04/2015    Procedure: Coronary Stent Intervention;  Surgeon: Troy Sine, MD;  Location: [facility];  Service: Cardiovascular;  Laterality: N/A;    Family History  Problem Relation Age of Onset  . Cancer - Other Mother   . Brain cancer Father   . CAD Brother    Social History   Social History  . Marital Status: Married    Spouse Name: N/A  . Number of Children: N/A  . Years of Education: N/A   Occupational History  . retired    Social History Main Topics  . Smoking status: Former Smoker -- 1.50 packs/day for 40 years    Types: Cigarettes    Quit date: 08/20/2003  . Smokeless tobacco: Not on file  . Alcohol Use: No  . Drug Use: No  . Sexual Activity: Not Currently   Other Topics Concern  . Not on file   Social History Narrative   ROS-see HPI   Negative unless "+" Constitutional:    weight loss, night sweats, fevers, chills, fatigue, lassitude. HEENT:    headaches, difficulty swallowing, tooth/dental problems, sore throat,       sneezing, itching, ear ache, nasal congestion, post nasal drip, snoring CV:    chest pain, orthopnea, PND, swelling in lower extremities, anasarca,                                                          dizziness, palpitations Resp:   + shortness of breath with exertion or at rest.                + productive cough,   non-productive cough, + coughing up of blood.              + change in color of mucus.  wheezing.   Skin:    rash or lesions. GI:  No-   heartburn, indigestion, abdominal pain, nausea, vomiting, diarrhea,                 change in bowel habits, loss of appetite GU: dysuria, change in color of urine, no urgency or frequency.   flank pain. MS:   joint pain, stiffness, decreased range of motion, back pain. Neuro-     nothing unusual Psych:  change in mood or affect.  depression or anxiety.   memory loss.  OBJ- Physical Exam General- Alert, Oriented, Affect-appropriate, Distress- none acute Skin- rash-none, lesions- none, excoriation- none Lymphadenopathy- none Head- atraumatic            Eyes- Gross vision intact, PERRLA, conjunctivae and secretions clear             Ears- Hearing, canals-normal            Nose- Clear, no-Septal dev, mucus, polyps, erosion, perforation             Throat- Mallampati II , mucosa clear , drainage- none, tonsils- atrophic Neck- flexible , trachea midline, no stridor , thyroid nl, carotid no bruit Chest - symmetrical excursion , unlabored           Heart/CV- RRR , no murmur , no gallop  , no rub, nl s1 s2                           -  JVD- none , edema- none, stasis changes- none, varices- none           Lung- + few crackles, wheeze- none, cough with tiny blood speck in pale yellow/white sputum , dullness-none, rub- none           Chest wall-  Abd-  Br/ Gen/ Rectal- Not done, not indicated Extrem- cyanosis- none, clubbing, none, atrophy- none, strength- nl Neuro- grossly intact to observation

## 2015-10-14 NOTE — Assessment & Plan Note (Signed)
CT chest 10/06/2015 shows bilateral upper zone posterior infiltrates appearing inflammatory. He had recently taken Z-Pak and prednisone for bronchitis. We will treat this as pneumonia with doxycycline but consider aspirated blood as a possibility.

## 2015-10-24 ENCOUNTER — Other Ambulatory Visit (INDEPENDENT_AMBULATORY_CARE_PROVIDER_SITE_OTHER): Payer: Medicare Other | Admitting: *Deleted

## 2015-10-24 ENCOUNTER — Other Ambulatory Visit: Payer: Self-pay

## 2015-10-24 ENCOUNTER — Ambulatory Visit (HOSPITAL_COMMUNITY): Payer: Medicare Other | Attending: Cardiology

## 2015-10-24 ENCOUNTER — Other Ambulatory Visit: Payer: Self-pay | Admitting: *Deleted

## 2015-10-24 DIAGNOSIS — R29898 Other symptoms and signs involving the musculoskeletal system: Secondary | ICD-10-CM | POA: Insufficient documentation

## 2015-10-24 DIAGNOSIS — I2510 Atherosclerotic heart disease of native coronary artery without angina pectoris: Secondary | ICD-10-CM | POA: Insufficient documentation

## 2015-10-24 DIAGNOSIS — I071 Rheumatic tricuspid insufficiency: Secondary | ICD-10-CM | POA: Insufficient documentation

## 2015-10-24 DIAGNOSIS — I213 ST elevation (STEMI) myocardial infarction of unspecified site: Secondary | ICD-10-CM | POA: Diagnosis not present

## 2015-10-24 DIAGNOSIS — I358 Other nonrheumatic aortic valve disorders: Secondary | ICD-10-CM | POA: Diagnosis not present

## 2015-10-24 DIAGNOSIS — E785 Hyperlipidemia, unspecified: Secondary | ICD-10-CM

## 2015-10-24 DIAGNOSIS — R570 Cardiogenic shock: Secondary | ICD-10-CM | POA: Diagnosis not present

## 2015-10-24 DIAGNOSIS — I340 Nonrheumatic mitral (valve) insufficiency: Secondary | ICD-10-CM | POA: Diagnosis not present

## 2015-10-24 DIAGNOSIS — F17200 Nicotine dependence, unspecified, uncomplicated: Secondary | ICD-10-CM | POA: Insufficient documentation

## 2015-10-24 LAB — HEPATIC FUNCTION PANEL
ALT: 17 U/L (ref 9–46)
AST: 23 U/L (ref 10–35)
Albumin: 3.8 g/dL (ref 3.6–5.1)
Alkaline Phosphatase: 86 U/L (ref 40–115)
Bilirubin, Direct: 0.2 mg/dL (ref <=0.2)
Indirect Bilirubin: 0.4 mg/dL (ref 0.2–1.2)
Total Bilirubin: 0.6 mg/dL (ref 0.2–1.2)
Total Protein: 7.1 g/dL (ref 6.1–8.1)

## 2015-10-24 LAB — BASIC METABOLIC PANEL
BUN: 14 mg/dL (ref 7–25)
CO2: 26 mmol/L (ref 20–31)
Calcium: 9.3 mg/dL (ref 8.6–10.3)
Chloride: 101 mmol/L (ref 98–110)
Creat: 0.98 mg/dL (ref 0.70–1.25)
Glucose, Bld: 85 mg/dL (ref 65–99)
Potassium: 4.1 mmol/L (ref 3.5–5.3)
Sodium: 137 mmol/L (ref 135–146)

## 2015-10-24 LAB — LIPID PANEL
Cholesterol: 132 mg/dL (ref 125–200)
HDL: 38 mg/dL — ABNORMAL LOW (ref >=40)
LDL Cholesterol: 65 mg/dL (ref <130)
Total CHOL/HDL Ratio: 3.5 Ratio (ref <=5.0)
Triglycerides: 143 mg/dL (ref <150)
VLDL: 29 mg/dL (ref <30)

## 2015-10-25 ENCOUNTER — Encounter: Payer: Self-pay | Admitting: Nurse Practitioner

## 2015-10-25 ENCOUNTER — Ambulatory Visit (INDEPENDENT_AMBULATORY_CARE_PROVIDER_SITE_OTHER): Payer: Medicare Other | Admitting: Nurse Practitioner

## 2015-10-25 VITALS — BP 108/68 | HR 76 | Ht 71.0 in | Wt 166.0 lb

## 2015-10-25 DIAGNOSIS — E785 Hyperlipidemia, unspecified: Secondary | ICD-10-CM | POA: Diagnosis not present

## 2015-10-25 DIAGNOSIS — R570 Cardiogenic shock: Secondary | ICD-10-CM | POA: Diagnosis not present

## 2015-10-25 DIAGNOSIS — I213 ST elevation (STEMI) myocardial infarction of unspecified site: Secondary | ICD-10-CM

## 2015-10-25 MED ORDER — METOPROLOL TARTRATE 25 MG PO TABS: 12.5000 mg | ORAL_TABLET | Freq: Two times a day (BID) | ORAL | Status: DC

## 2015-10-25 NOTE — Progress Notes (Signed)
CARDIOLOGY OFFICE NOTE  Date:  10/25/2015    Leanord Sloan Date of Birth: [birth year] Medical Record [id number]  PCP:  [facility]  Cardiologist:  [name]    Chief Complaint  Patient presents with  . Coronary Artery Disease    Follow up visit - seen for Dr. [name]    History of Present Illness: Cory Sloan is a 69 y.o. male who presents today for a follow up visit. Seen for Dr. [name].   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   He presented 10/04/15 with several weeks of ongoing chest pain to the ED. He was seen in ED on 11/10 and discharged on prednisone and albuterol for an acute COPD exacerbation. Troponins were not elevated at that time x 2. Per ED, pain worsened approximately 90 minutes to presentation on 10/04/2015 and initial EKG showed ST elevations in the inferior leads with reciprocal ST changes in avR, avL. He was taken for emergent cath.   Cath showed complete proximal RCA occlusion s/p DES to the proximal to mid RCA. Post cath patient developed cardiogenic shock and hypotension. Which was refractory to IV fluid resuscitation initially . He was started on dopamine and PCCM consulted for elevated lactic acid level of 4.6. Symptoms improved with fluid resuscitation and was weaned off dopamine. Renal function remained stable after fluid resuscitation with continued improvement in his hyperkalemia (pesudohyperkalemia) with resolution after kayexalate tx. He complained of pink-tinged sputum/ mild hemoptysis. Chest CT showed Emphysematous disease with new mixed interstitial airspace process over the posterior segment upper lobe bilaterally likely infection. Small bilateral pleural effusions with bibasilar atelectasis. However resolution of leucocytosis. Hemoptysis likely due to IV heparin + dryness in air + baseline tacheal stoma.  Lactic acidosis felt likely secondary to acute myocardial infarction.   Unable to start beta blocker and ACE inhibitor due to asymptomatic hypotension. Continue Brilinta, ASA and lipitor 80mg . TSH was elevated that admission likely due to acute illness.   I saw him just 2 weeks ago - he was doing ok. Having phlegm which is chronic - chronic hemoptysis. I got him to see pulmonary for a work in. BP too low to put on good CHF regimen.   Comes in today. Here with his wife. Has seen pulmonary and put on tessalon. Saw ENT for trach check up. Cough has improved. No more hemoptysis. No chest pain. Not short of breath. Slowly increasing his activity level without issue. Tolerating his medicine. BP improving and a little higher at home. Notes his heart rate sometimes is elevated - he is asymptomatic.   Past Medical History  Diagnosis Date  . 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 Tobacco abuse   . COPD (chronic obstructive pulmonary disease) ([place])   . STEMI (ST elevation myocardial infarction) [facility]) November 2016    with PCI to RCA and associated cardiogenic shock  . HLD (hyperlipidemia)     Past Surgical History  Procedure Laterality Date  . Appendectomy    . Throat surgery  2005    [place]  . Cardiac catheterization N/A 10/04/2015    Procedure: Left Heart Cath and Coronary Angiography;  Surgeon: Troy Sine, MD;  Location: [facility];  Service: Cardiovascular;  Laterality: N/A;  . Cardiac catheterization N/A 10/04/2015  Procedure: Coronary Stent Intervention;  Surgeon: Troy Sine, MD;  Location: [facility];  Service: Cardiovascular;  Laterality: N/A;     Medications: Current Outpatient Prescriptions  Medication Sig Dispense Refill  . albuterol (PROVENTIL HFA;VENTOLIN HFA) 108 (90 BASE) MCG/ACT inhaler Inhale 1-2 puffs into the lungs every 4 (four) hours as needed for wheezing or shortness of breath (or  coughing). 1 Inhaler 0  . aspirin EC 81 MG EC tablet Take 1 tablet (81 mg total) by mouth daily.    Marland Kitchen atorvastatin (LIPITOR) 80 MG tablet Take 1 tablet (80 mg total) by mouth daily at 6 PM. 30 tablet 11  . benzonatate (TESSALON) 200 MG capsule Take 1 capsule (200 mg total) by mouth 3 (three) times daily as needed for cough. 30 capsule 1  . nitroGLYCERIN (NITROSTAT) 0.4 MG SL tablet Place 1 tablet (0.4 mg total) under the tongue every 5 (five) minutes as needed for chest pain (CP or SOB). 25 tablet 12  . ticagrelor (BRILINTA) 90 MG TABS tablet Take 1 tablet (90 mg total) by mouth 2 (two) times daily. 60 tablet 11  . metoprolol tartrate (LOPRESSOR) 25 MG tablet Take 0.5 tablets (12.5 mg total) by mouth 2 (two) times daily. 90 tablet 3   No current facility-administered medications for this visit.    Allergies: No Known Allergies  Social History: The patient  reports that he quit smoking about 12 years ago. His smoking use included Cigarettes. He has a 60 pack-year smoking history. He does not have any smokeless tobacco history on file. He reports that he does not drink alcohol or use illicit drugs.   Family History: The patient's family history includes Brain cancer in his father; CAD in his brother; Cancer - Other in his mother.   Review of Systems: Please see the history of present illness.   Otherwise, the review of systems is positive for none.   All other systems are reviewed and negative.   Physical Exam: VS:  BP 108/68 mmHg  Pulse 76  Ht 5\' 11"  (1.803 m)  Wt 166 lb (75.297 kg)  BMI 23.16 kg/m2 .  BMI Body mass index is 23.16 kg/(m^2).  Wt Readings from Last 3 Encounters:  10/25/15 166 lb (75.297 kg)  10/14/15 162 lb (73.483 kg)  10/12/15 164 lb 12.8 oz (74.753 kg)    General: Pleasant. He is alert and in no acute distress.  HEENT: Normal.Using a voice box to talk. Neck: Supple, no JVD, carotid bruits, or masses noted.  Cardiac: Regular rate and rhythm. No murmurs, rubs, or  gallops. No edema.  Respiratory:  Lungs are clear to auscultation bilaterally with normal work of breathing.  GI: Soft and nontender.  MS: No deformity or atrophy. Gait and ROM intact. Skin: Warm and dry. Color is normal.  Neuro:  Strength and sensation are intact and no gross focal deficits noted.  Psych: Alert, appropriate and with normal affect.   LABORATORY DATA:  EKG:  EKG is not ordered today.  Lab Results  Component Value Date   WBC 10.9* 10/12/2015   HGB 14.5 10/12/2015   HCT 40.5 10/12/2015   PLT 292 10/12/2015   GLUCOSE 85 10/24/2015   CHOL 132 10/24/2015   TRIG 143 10/24/2015   HDL 38* 10/24/2015   LDLCALC 65 10/24/2015   ALT 17 10/24/2015   AST 23 10/24/2015   NA 137 10/24/2015   K 4.1 10/24/2015   CL 101 10/24/2015   CREATININE 0.98 10/24/2015   BUN 14  10/24/2015   CO2 26 10/24/2015   TSH 4.536* 10/12/2015   INR 1.05 10/06/2015   HGBA1C 5.8* 10/05/2015    BNP (last 3 results) No results for input(s): BNP in the last 8760 hours.  ProBNP (last 3 results) No results for input(s): PROBNP in the last 8760 hours.   Other Studies Reviewed Today:  Left Heart Cath and Coronary Angiography    Conclusion     Mid LAD to Dist LAD lesion, 15% stenosed.  Prox RCA to Mid RCA lesion, 100% stenosed. Post intervention, there is a 0% residual stenosis.  There is mild left ventricular systolic dysfunction.  Acute ST segment elevation myocardial infarction secondary to total proximal occlusion of a large dominant RCA.  Mild luminal irregularities of the LAD without significant obstructive disease.  Normal left circumflex coronary artery.  Mild acute LV dysfunction with an ejection fraction of 50% with mild mid inferior wall hypocontractility.  Successful PCI/DES stenting of a large dominant RCA with ultimate insertion of a 4.032 mm Synergy DES stent postdilated to 4.6 mm 100% occlusion reduced to 0% and restoration of brisk TIMI-3 flow without evidence  for dissection.  RECOMMENDATION: The patient will be maintained on dual antiplatelet therapy. He will be vigorously hydrated due to his large RCA and potential for RV infarction. I would anticipate good recovery of LV function. Once his blood pressure allows he will be started on beta blocker, ACE inhibitor with high potency statin therapy.       Left Heart    Left Ventricle The left ventricular size is normal. There is mild left ventricular systolic dysfunction. The left ventricular ejection fraction is 45-50% by visual estimate. There are wall motion abnormalities in the left ventricle. Very mild inferior hypocontractility with an overall ejection fraction of 50%.   Echo Study Conclusions from 10/2015  - Left ventricle: Global longitudinal LV strain is reduced at -14.2% The cavity size was normal. Systolic function was normal. The estimated ejection fraction was in the range of 50% to 55%. There is akinesis of the basalinferior myocardium. There was an increased relative contribution of atrial contraction to ventricular filling. Doppler parameters are consistent with abnormal left ventricular relaxation (grade 1 diastolic dysfunction). - Aortic valve: Trileaflet; mildly thickened, mildly calcified leaflets. - Mitral valve: There was mild regurgitation. - Tricuspid valve: There was trivial regurgitation.  Assessment/Plan: 1. STEMI with PCI to the RCA - associated with cardiogenic shock - EF of 45% at time of cath but higher by echo.  Continuing with DAPT. BP a little higher now. Adding low dose Metoprolol at 12.5 mg BID. Hopefully he will tolerate. Would hold on ACE for now.  Ok to increase activity as tolerated. Recent echo with normal LV function. See back as planned.   2. Laryngeal cancer  3. Past smoker  4. COPD - his hemoptysis is now resolved.   5. HLD - on statin therapy - recent labs look good.         Current medicines are reviewed  with the patient today.  The patient does not have concerns regarding medicines other than what has been noted above.  The following changes have been made:  See above.  Labs/ tests ordered today include:   No orders of the defined types were placed in this encounter.     Disposition:   FU with Dr. [name] as planned.   Patient is agreeable to this plan and will call if any problems develop in the interim.   Signed: Marlane Hatcher.  Servando Snare, RN, ANP-C 10/25/2015 10:14 AM  [facility] [address] [place], [place]  [address] Phone: [phone] Fax: [phone]

## 2015-10-25 NOTE — Patient Instructions (Addendum)
We will be checking the following labs today - NONE   Medication Instructions:    Continue with your current medicines.   I am adding Lopressor to your regimen - take just 1/2 tablet just twice a day.     Testing/Procedures To Be Arranged:  N/A  Follow-Up:   See Dr. [name] in February    Other Special Instructions:   Ok to gradually resume your regular activities     If you need a refill on your cardiac medications before your next appointment, please call your pharmacy.   Call the [facility] office at [phone] if you have any questions, problems or concerns.

## 2015-10-27 ENCOUNTER — Telehealth: Payer: Self-pay | Admitting: Cardiovascular Disease

## 2015-10-27 NOTE — Telephone Encounter (Signed)
NEw MEssage  Pt stated that since starting metoprolol on Tues 12/6, pt c/o of feeling weak and tired. Please call back and discuss.

## 2015-10-27 NOTE — Telephone Encounter (Signed)
Pt started on 12.5mg  metoprolol tartrate BID 2 days ago & c/o feeling weak, tired.   Reports very tired last night and this morning.  BP checked at home reported as 91/64 & 96/61 HR ~90  Noted these are comparable to obtained office BPs prior to starting med.  Pt not having dizziness on standing. No syncope/near syncope. C/o fatigue only.  Advised fatigue likely related to starting medication & this should improve over several days/1-2 weeks.  Routed to Dr. [name] for any dditional recommendations.

## 2015-10-28 NOTE — Telephone Encounter (Signed)
agree

## 2015-11-30 ENCOUNTER — Inpatient Hospital Stay: Payer: Medicare Other | Admitting: Pulmonary Disease

## 2015-12-07 ENCOUNTER — Telehealth: Payer: Self-pay | Admitting: Cardiovascular Disease

## 2015-12-07 DIAGNOSIS — Z79899 Other long term (current) drug therapy: Secondary | ICD-10-CM

## 2015-12-07 NOTE — Telephone Encounter (Signed)
Returned call to patient's wife.She stated she wanted to ask Dr.Kelly if he can prescribe another blood thinner.Brilinta cost too much.Advised Dr.Kelly out of office this week.Brilinta samples left at front desk.Advised I will send message to [facility] for advice.

## 2015-12-07 NOTE — Telephone Encounter (Signed)
Pt's wife called in stating that his insurance([employer]) is no longer allowing [facility] to fill their prescriptions, and the price of his Kary Kos is now $296. The pt's wife would like to know if there is a generic form that he can take that would be cheaper. Please f/u as soon as possible because he is out of this medication.  Thanks

## 2015-12-12 MED ORDER — CLOPIDOGREL BISULFATE 75 MG PO TABS: 75.0000 mg | ORAL_TABLET | Freq: Every day | ORAL | Status: DC

## 2015-12-12 NOTE — Telephone Encounter (Signed)
Once finished with brilinta samples can try clopidogril 75 mg daily and check P2Y12  test after 1 week

## 2015-12-12 NOTE — Telephone Encounter (Signed)
Received call from patient's wife.Dr.Kelly's recommendations given.Advised needs to have P2Y12 lab at [facility] 1 week after taking Plavix.

## 2015-12-12 NOTE — Telephone Encounter (Signed)
Returned call to patient's wife no answer.LMTC. 

## 2015-12-27 ENCOUNTER — Telehealth: Payer: Self-pay | Admitting: Cardiovascular Disease

## 2015-12-27 NOTE — Telephone Encounter (Signed)
New Message  Pt c/o medication issue: 1. Name of Medication: not sure which medication it is   4. What is your medication issue? Pt wife called states that her husband is breaking out in [place]. Request a call back to discuss

## 2015-12-27 NOTE — Telephone Encounter (Signed)
Called patient Left voice message to call back Also told her that if these hives were recent or in the past 6 hours or if he has any difficulty breathing then he needs to go to the Emergency Department, otherwise return the call to see if we can be of any assistance

## 2015-12-29 ENCOUNTER — Telehealth: Payer: Self-pay | Admitting: Cardiovascular Disease

## 2015-12-29 NOTE — Telephone Encounter (Signed)
Pt c/o rash which cropped up last week. He is taking oral benadryl which has proved some relief. Consideration of possible medication allergies? All of his meds are new to him.  Note: Pt started on Brilinta 90mg  BID out of hospital - has samples.  Has not started on plavix yet & won't until Brilinta runs out or o/w instructed.  Other meds being taken as listed.  Wife aware I will route to [facility] for advice and return her call. Advised to continue benadryl orally and can do hydrocortisone or anti-itch topical cream if needed.

## 2015-12-29 NOTE — Telephone Encounter (Signed)
F/u   ReturningRN phon call please calllisted mobile #[phone] (M) Please call back and discuss.

## 2015-12-30 MED ORDER — CARVEDILOL 3.125 MG PO TABS: 3.1250 mg | ORAL_TABLET | Freq: Two times a day (BID) | ORAL | Status: DC

## 2015-12-30 NOTE — Telephone Encounter (Signed)
Pt given instructions on medication change.

## 2015-12-30 NOTE — Telephone Encounter (Signed)
As discussed w/ Cory Sloan - continue Brilinta for now, will switch BB. Returned call and communicated this advice to pt's wife who voiced understanding.

## 2015-12-30 NOTE — Telephone Encounter (Signed)
Switch metoprolol 12.5 mg bid to carvedilol 3.125 mg bid.   Stop metoprolol and wait 24 hrs to start carvedilol.

## 2015-12-30 NOTE — Telephone Encounter (Signed)
Pt had SOB last night. Some CP accompanied this. Wife tried to get pt to go to ED. Pt declined and noted improvement later. She was unsure how long the episode lasted. Pt did not take NTG. Pt feels fine this morning. Advised to take nitro if similar symptoms present. Gave instruction regarding this and when to seek emergency care.  Pt is still having problems w/ rash.  Has not taken AM meds yet. Advised to take all meds this AM - except Brilinta, to hold for now - will see if Erasmo Downer advises switching over to Plavix starting today.  Unsure if his allergic rxn is stemming from lipitor, metoprolol, or brilinta. Deferring for advice.

## 2015-12-30 NOTE — Telephone Encounter (Signed)
Left msg for patient to return call. [facility]

## 2015-12-30 NOTE — Telephone Encounter (Signed)
Follow Up  Pt had a hard time breathing last night. States that they were going to go to the hospital but the pt declined.  Pt c/o Shortness Of Breath: STAT if SOB developed within the last 24 hours or pt is noticeably SOB on the phone  1. Are you currently SOB (can you hear that pt is SOB on the phone)? Yes a little bit  2. How long have you been experiencing SOB? 48 Hours. (last night was bad around 12a for 30 minutes  3. Are you SOB when sitting or when up moving around? He was in the bathroom when he started coughing. He couldn't speak.  4.  Are you currently experiencing any other symptoms? No He did break out in hives almost like an allergic reaction.

## 2016-01-02 ENCOUNTER — Telehealth: Payer: Self-pay | Admitting: Cardiovascular Disease

## 2016-01-02 NOTE — Telephone Encounter (Signed)
Encounter closed per previous message, no follow up necessary.

## 2016-01-02 NOTE — Telephone Encounter (Signed)
Follow up    Patient wife just called back - Mr. Cory Sloan found his nitro tablet - office does not need to called in prescription.

## 2016-01-02 NOTE — Telephone Encounter (Signed)
°*  STAT* If patient is at the pharmacy, call can be transferred to refill team.   1. Which medications need to be refilled? (please list name of each medication and dose if known) Nitroglycerin-new prescription  2. Which pharmacy/location (including street and city if local pharmacy) is medication to be sent to?[facility]  3. Do they need a 30 day or 90 day supply? 1 bottle

## 2016-01-04 NOTE — Telephone Encounter (Signed)
Close encounter 

## 2016-01-06 ENCOUNTER — Ambulatory Visit: Payer: Medicare Other | Admitting: Cardiovascular Disease

## 2016-01-10 ENCOUNTER — Ambulatory Visit (INDEPENDENT_AMBULATORY_CARE_PROVIDER_SITE_OTHER): Payer: Medicare Other | Admitting: Cardiovascular Disease

## 2016-01-10 ENCOUNTER — Encounter: Payer: Self-pay | Admitting: Cardiovascular Disease

## 2016-01-10 VITALS — BP 102/70 | HR 75 | Ht 70.0 in | Wt 173.0 lb

## 2016-01-10 DIAGNOSIS — Z79899 Other long term (current) drug therapy: Secondary | ICD-10-CM

## 2016-01-10 DIAGNOSIS — J449 Chronic obstructive pulmonary disease, unspecified: Secondary | ICD-10-CM

## 2016-01-10 DIAGNOSIS — C329 Malignant neoplasm of larynx, unspecified: Secondary | ICD-10-CM

## 2016-01-10 DIAGNOSIS — I25810 Atherosclerosis of coronary artery bypass graft(s) without angina pectoris: Secondary | ICD-10-CM

## 2016-01-10 DIAGNOSIS — E785 Hyperlipidemia, unspecified: Secondary | ICD-10-CM | POA: Diagnosis not present

## 2016-01-10 DIAGNOSIS — I2111 ST elevation (STEMI) myocardial infarction involving right coronary artery: Secondary | ICD-10-CM

## 2016-01-10 DIAGNOSIS — Z136 Encounter for screening for cardiovascular disorders: Secondary | ICD-10-CM | POA: Diagnosis not present

## 2016-01-10 NOTE — Patient Instructions (Addendum)
Your physician recommends that you return for lab work on Friday 01/13/16. At [facility].  Your physician recommends that you schedule a follow-up appointment in:  2 MONTHS with Dr [name].

## 2016-01-10 NOTE — Progress Notes (Signed)
Patient ID: Cory Sloan, male   DOB: 1946-06-28, 70 y.o.   MRN: [id number]     HPI: Cory Sloan is a 70 y.o. male who presents to the office today for an initial follow-up evaluation with me following his inferior ST segment elevation myocardial infarction.  Cory Sloan presented to [facility] 10/04/2015 with an acute ST segment elevation myocardial infarction secondary to total proximal occlusion of a large, dominant RCA.  He had a prior history of tobacco use and COPD in 2005 underwent laryngectomy for laryngeal CA.  There was no prior history of ischemic heart disease.  He was taken emergently to the catheterization laboratory and underwent successful PCI/DES stenting of a large dominant RCA with ultimate insertion of a 4.032 mm Synergy DES stent postdilated to 4.6 mm with the 100 percent occlusion reduced to 0% and restoration of brisk TIMI-3 flow.  Initial ejection fraction was 50% with mild mid inferior wall hypocontractility.  Post catheterization he developed cardiogenic shock and hypotension, which initially was refractory to IV fluid resuscitation and required dopamine.  A chest CT showed emphysematous disease.  He was seen by Meyer Russel on 2 occasions post hospitalization with the most recent being in December 2016.  An echo Doppler study at that time showed an EF of 50-55% with akinesis of the basal inferior myocardium and grade 1 diastolic dysfunction.  There was mild aortic sclerosis, mild mitral and trivial tricuspid regurgitation.  Blood pressure has been low and as result, he is only been able to be on very minimal medical therapy consisting of carvedilol one half of a 3.125 mg pill twice a day.  He had been on Brilinta farina, platelet therapy and as of yesterday.  He was started on Plavix in place of Brilinta.  The patient has noticed some shortness of breath and had undergone pulmonary evaluation.  He denies any recurrent chest tightness similar to his presenting chest  pain.  He presents for evaluation.  Past Medical History  Diagnosis Date  . 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 Tobacco abuse   . COPD (chronic obstructive pulmonary disease) (HCC)   . STEMI (ST elevation myocardial infarction) [facility]) November 2016    with PCI to RCA and associated cardiogenic shock  . HLD (hyperlipidemia)     Past Surgical History  Procedure Laterality Date  . Appendectomy    . Throat surgery  2005    CA  . Cardiac catheterization N/A 10/04/2015    Procedure: Left Heart Cath and Coronary Angiography;  Surgeon: Lennette Bihari, MD;  Location: [facility] INVASIVE CV LAB;  Service: Cardiovascular;  Laterality: N/A;  . Cardiac catheterization N/A 10/04/2015    Procedure: Coronary Stent Intervention;  Surgeon: Lennette Bihari, MD;  Location: [facility] INVASIVE CV LAB;  Service: Cardiovascular;  Laterality: N/A;    No Known Allergies  Current Outpatient Prescriptions  Medication Sig Dispense Refill  . albuterol (PROVENTIL HFA;VENTOLIN HFA) 108 (90 BASE) MCG/ACT inhaler Inhale 1-2 puffs into the lungs every 4 (four) hours as needed for wheezing or shortness of breath (or coughing). 1 Inhaler 0  . aspirin EC 81 MG EC tablet Take 1 tablet (81 mg total) by mouth daily.    Marland Kitchen atorvastatin (LIPITOR) 80 MG tablet Take 1 tablet (80 mg total) by mouth daily at 6 PM. 30 tablet 11  . benzonatate (TESSALON) 200 MG capsule Take 1 capsule (200 mg total) by  mouth 3 (three) times daily as needed for cough. 30 capsule 1  . carvedilol (COREG) 3.125 MG tablet Take 3.125 mg by mouth 2 (two) times daily with a meal. Pt takes half in the morning and half at night    . clopidogrel (PLAVIX) 75 MG tablet Take 1 tablet (75 mg total) by mouth daily. 30 tablet 6  . nitroGLYCERIN (NITROSTAT) 0.4 MG SL tablet Place 1 tablet (0.4 mg total) under the tongue every 5 (five) minutes as needed for chest pain (CP or SOB). 25 tablet 12   No current  facility-administered medications for this visit.    Social History   Social History  . Marital Status: Married    Spouse Name: N/A  . Number of Children: N/A  . Years of Education: N/A   Occupational History  . retired    Social History Main Topics  . Smoking status: Former Smoker -- 1.50 packs/day for 40 years    Types: Cigarettes    Quit date: 08/20/2003  . Smokeless tobacco: Not on file  . Alcohol Use: No  . Drug Use: No  . Sexual Activity: Not Currently   Other Topics Concern  . Not on file   Social History Narrative   Socially he is married.  He quit smoking 13 years ago.  Family History  Problem Relation Age of Onset  . Cancer - Other Mother   . Brain cancer Father   . CAD Brother     ROS General: Negative; No fevers, chills, or night sweats HEENT: Negative; No changes in vision or hearing, sinus congestion, difficulty swallowing Pulmonary: Positive for shortness of breath Cardiovascular: See HPI: No chest pain, presyncope, syncope, palpatations GI: Negative; No nausea, vomiting, diarrhea, or abdominal pain GU: Negative; No dysuria, hematuria, or difficulty voiding Musculoskeletal: Negative; no myalgias, joint pain, or weakness Hematologic: Negative; no easy bruising, bleeding Endocrine: Negative; no heat/cold intolerance; no diabetes, Neuro: Negative; no changes in balance, headaches Skin: Negative; No rashes or skin lesions Psychiatric: Negative; No behavioral problems, depression Sleep: Negative; No snoring,  daytime sleepiness, hypersomnolence, bruxism, restless legs, hypnogognic hallucinations. Other comprehensive 14 point system review is negative   Physical Exam BP 102/70 mmHg  Pulse 75  Ht '5\' 10"'$  (1.778 m)  Wt 173 lb (78.472 kg)  BMI 24.82 kg/m2 Wt Readings from Last 3 Encounters:  01/10/16 173 lb (78.472 kg)  10/25/15 166 lb (75.297 kg)  10/14/15 162 lb (73.483 kg)   General: Alert, oriented, no distress.  Skin: normal turgor, no  rashes, warm and dry HEENT: Normocephalic, atraumatic. Pupils equal round and reactive to light; sclera anicteric; extraocular muscles intact, No lid lag; Nose without nasal septal hypertrophy; Mouth/Parynx benign;  Status post laryngectomy Neck: No JVD, no carotid bruits; normal carotid upstroke Lungs: Slightly decreased breath sounds bilaterally; no wheezing or rales, normal inspiratory and expiratory effort Chest wall: without tenderness to palpitation Heart: PMI not displaced, RRR, s1 s2 normal, 1/6 systolic murmur, No diastolic murmur, no rubs, gallops, thrills, or heaves Abdomen: soft, nontender; no hepatosplenomehaly, BS+; abdominal aorta nontender and not dilated by palpation. Back: no CVA tenderness Pulses: 2+  Musculoskeletal: full range of motion, normal strength, no joint deformities Extremities: Pulses 2+, no clubbing cyanosis or edema, Homan's sign negative  Neurologic: grossly nonfocal; Cranial nerves grossly wnl Psychologic: Normal mood and affect   ECG (independently read by me): Normal sinus rhythm.  Left anterior hemiblock.  Inferior Q waves with ST- T-wave abnormalities inferolaterally  LABS:  BMP Latest Ref Rng  10/24/2015 10/12/2015 10/07/2015  Glucose 65 - 99 mg/dL 85 75 91  BUN 7 - 25 mg/dL '14 18 14  '$ Creatinine 0.70 - 1.25 mg/dL 0.98 0.94 1.10  Sodium 135 - 146 mmol/L 137 136 135  Potassium 3.5 - 5.3 mmol/L 4.1 4.6 3.6  Chloride 98 - 110 mmol/L 101 101 104  CO2 20 - 31 mmol/L '26 25 24  '$ Calcium 8.6 - 10.3 mg/dL 9.3 8.8 8.2(L)     Hepatic Function Latest Ref Rng 10/24/2015 10/12/2015 10/04/2015  Total Protein 6.1 - 8.1 g/dL 7.1 6.6 6.7  Albumin 3.6 - 5.1 g/dL 3.8 3.5(L) 3.9  AST 10 - 35 U/L '23 16 29  '$ ALT 9 - 46 U/L '17 16 21  '$ Alk Phosphatase 40 - 115 U/L 86 79 65  Total Bilirubin 0.2 - 1.2 mg/dL 0.6 0.7 0.9  Bilirubin, Direct <=0.2 mg/dL 0.2 0.1 -    CBC Latest Ref Rng 10/12/2015 10/07/2015 10/06/2015  WBC 4.0 - 10.5 K/uL 10.9(H) 10.4 14.2(H)  Hemoglobin  13.0 - 17.0 g/dL 14.5 14.1 13.2  Hematocrit 39.0 - 52.0 % 40.5 42.7 39.8  Platelets 150 - 400 K/uL 292 196 200   Lab Results  Component Value Date   MCV 86.4 10/12/2015   MCV 90.5 10/07/2015   MCV 91.5 10/06/2015    Lab Results  Component Value Date   TSH 4.536* 10/12/2015    BNP No results found for: BNP  ProBNP    Component Value Date/Time   PROBNP 134.4* 04/03/2013 2327     Lipid Panel     Component Value Date/Time   CHOL 132 10/24/2015 1115   TRIG 143 10/24/2015 1115   HDL 38* 10/24/2015 1115   CHOLHDL 3.5 10/24/2015 1115   VLDL 29 10/24/2015 1115   LDLCALC 65 10/24/2015 1115     RADIOLOGY: No results found.    ASSESSMENT AND PLAN: Cory Sloan is a 70 year old WM who is 3 months following his  acute inferior ST segment elevation myocardial infarction secondary to total proximal occlusion of a very large dominant RCA.  Subsequent ejection fraction has improved to 50-55% on echocardiography with basal inferior akinesis.  He continues to have low blood pressure with blood pressure in the 250-037 range systolically only on one half of a 3.125 mg twice a day dose of carvedilol.  He has a history of COPD with emphysematous changes noted on CT scan which may be contributory to some of his shortness of breath.  He had been on Brilinta for 3 months.  Yesterday he be to Plavix 150 mg for his first dose.  He did note some slight improvement in the shortness of breath yesterday, raising the possibility that some of his dyspnea mat  have been Brilinta induced dyspnea from its adenosine mechanism.  Later this week after 5 doses of Plavix he wil lhave laboratory with a P2Y12 assessment to assess his Plavix responsiveness.  At that time I will also recheck chemistry profile as well as CBC and lipid studies.  He is 3 months out of his MI.  I have suggested gradual increase in activity tolerated.  He has not had any recurrent anginal symptoms.  He is unaware of palpitations.  He denies  presyncope or syncope.  Hopefully, his dyspnea will improve off Brilinta.  I will see him in 2 months for cardiology reevaluation.   Troy Sine, MD, [facility]  01/10/2016 4:49 PM

## 2016-01-13 ENCOUNTER — Other Ambulatory Visit (HOSPITAL_COMMUNITY)
Admission: RE | Admit: 2016-01-13 | Discharge: 2016-01-13 | Disposition: A | Discharge status: Home / Self Care | Payer: Medicare Other | Source: Ambulatory Visit | Attending: Cardiovascular Disease | Admitting: Cardiovascular Disease

## 2016-01-13 DIAGNOSIS — I25810 Atherosclerosis of coronary artery bypass graft(s) without angina pectoris: Secondary | ICD-10-CM | POA: Insufficient documentation

## 2016-01-13 LAB — CBC WITH DIFFERENTIAL/PLATELET
Basophils Absolute: 0.1 10*3/uL (ref 0.0–0.1)
Basophils Relative: 1 %
EOS ABS: 0.2 10*3/uL (ref 0.0–0.7)
EOS PCT: 3 %
HCT: 44.3 % (ref 39.0–52.0)
Hemoglobin: 15 g/dL (ref 13.0–17.0)
LYMPHS ABS: 2.4 10*3/uL (ref 0.7–4.0)
Lymphocytes Relative: 36 %
MCH: 30.3 pg (ref 26.0–34.0)
MCHC: 33.9 g/dL (ref 30.0–36.0)
MCV: 89.5 fL (ref 78.0–100.0)
MONOS PCT: 10 %
Monocytes Absolute: 0.7 10*3/uL (ref 0.1–1.0)
Neutro Abs: 3.4 10*3/uL (ref 1.7–7.7)
Neutrophils Relative %: 50 %
Platelets: 251 10*3/uL (ref 150–400)
RBC: 4.95 MIL/uL (ref 4.22–5.81)
RDW: 13.6 % (ref 11.5–15.5)
WBC: 6.7 10*3/uL (ref 4.0–10.5)

## 2016-01-13 LAB — LIPID PANEL
CHOLESTEROL: 241 mg/dL — AB (ref 0–200)
HDL: 44 mg/dL (ref >40)
LDL CALC: 146 mg/dL — AB (ref 0–99)
TRIGLYCERIDES: 256 mg/dL — AB (ref <150)
Total CHOL/HDL Ratio: 5.5 RATIO
VLDL: 51 mg/dL — ABNORMAL HIGH (ref 0–40)

## 2016-01-13 LAB — COMPREHENSIVE METABOLIC PANEL
ALK PHOS: 72 U/L (ref 38–126)
ALT: 28 U/L (ref 17–63)
AST: 30 U/L (ref 15–41)
Albumin: 3.8 g/dL (ref 3.5–5.0)
Anion gap: 10 (ref 5–15)
BUN: 20 mg/dL (ref 6–20)
CALCIUM: 9.4 mg/dL (ref 8.9–10.3)
CHLORIDE: 105 mmol/L (ref 101–111)
CO2: 24 mmol/L (ref 22–32)
CREATININE: 1.02 mg/dL (ref 0.61–1.24)
GFR calc Af Amer: >60 mL/min (ref >60)
Glucose, Bld: 109 mg/dL — ABNORMAL HIGH (ref 65–99)
Potassium: 4.1 mmol/L (ref 3.5–5.1)
Sodium: 139 mmol/L (ref 135–145)
Total Bilirubin: 0.9 mg/dL (ref 0.3–1.2)
Total Protein: 6.8 g/dL (ref 6.5–8.1)

## 2016-01-13 LAB — PLATELET INHIBITION P2Y12: Platelet Function  P2Y12: 210 [PRU] (ref 194–418)

## 2016-01-19 ENCOUNTER — Other Ambulatory Visit: Payer: Self-pay | Admitting: Internal Medicine

## 2016-01-19 ENCOUNTER — Other Ambulatory Visit: Payer: Self-pay | Admitting: Nurse Practitioner

## 2016-01-20 ENCOUNTER — Telehealth: Payer: Self-pay | Admitting: *Deleted

## 2016-01-20 NOTE — Telephone Encounter (Signed)
-----   Message from Troy Sine, MD sent at 01/19/2016  4:01 PM EST ----- Labs ok x lipids; add zetia 10 mg to atorva 80 mg

## 2016-01-20 NOTE — Telephone Encounter (Signed)
Refill sent to [facility].? Is patient on metoprolol and coreg.

## 2016-01-20 NOTE — Telephone Encounter (Signed)
Called and spoke with patient's wife to give her lab results and recommendations. During the conversation it was decovered that the patient has been off of his atorvastatin for approximately 1 month. They apparently got mixed up on medication chang orders that was previously  given by Ovid Curd. They were supposed to stop the metoprolol and start carvedilol. They stopped the atorvastatin instead. The patient has been taking both metoprolol and carvedilol per his wife. I instructed her to stop the metoprolol as instructed previously and restart the atorvastatin and continue the carvedilol as directed. We went through the medications one by one. She vice her understanding. Dr [name] will be notified of this mix up.

## 2016-01-22 NOTE — Telephone Encounter (Signed)
agree

## 2016-01-31 ENCOUNTER — Ambulatory Visit: Payer: Medicare Other | Admitting: Internal Medicine

## 2016-03-09 ENCOUNTER — Ambulatory Visit (INDEPENDENT_AMBULATORY_CARE_PROVIDER_SITE_OTHER): Payer: Medicare Other | Admitting: Cardiovascular Disease

## 2016-03-09 ENCOUNTER — Encounter: Payer: Self-pay | Admitting: Cardiovascular Disease

## 2016-03-09 VITALS — BP 98/70 | HR 72 | Ht 70.0 in | Wt 174.0 lb

## 2016-03-09 DIAGNOSIS — Z79899 Other long term (current) drug therapy: Secondary | ICD-10-CM | POA: Diagnosis not present

## 2016-03-09 DIAGNOSIS — I2111 ST elevation (STEMI) myocardial infarction involving right coronary artery: Secondary | ICD-10-CM | POA: Diagnosis not present

## 2016-03-09 DIAGNOSIS — E785 Hyperlipidemia, unspecified: Secondary | ICD-10-CM | POA: Diagnosis not present

## 2016-03-09 DIAGNOSIS — C329 Malignant neoplasm of larynx, unspecified: Secondary | ICD-10-CM

## 2016-03-09 DIAGNOSIS — J449 Chronic obstructive pulmonary disease, unspecified: Secondary | ICD-10-CM

## 2016-03-09 MED ORDER — EZETIMIBE 10 MG PO TABS: 10.0000 mg | ORAL_TABLET | Freq: Every day | ORAL | Status: DC

## 2016-03-09 MED ORDER — CARVEDILOL 3.125 MG PO TABS: ORAL_TABLET | ORAL | Status: DC

## 2016-03-09 NOTE — Patient Instructions (Addendum)
Your physician has recommended you make the following change in your medication:   1.) the carvedilol has been increased to 1.5 tablets twice a day.  2.) start new prescription for generic zetia.  This has been sent in to your pharmacy.  3.) get some fish oil over the counter. Take 2 capsules daily.  Your physician recommends that you return for lab work and office visit in 3 months with Dr [name].

## 2016-03-10 ENCOUNTER — Other Ambulatory Visit: Payer: Self-pay | Admitting: Physician Assistant

## 2016-03-11 ENCOUNTER — Encounter: Payer: Self-pay | Admitting: Cardiovascular Disease

## 2016-03-11 DIAGNOSIS — E785 Hyperlipidemia, unspecified: Secondary | ICD-10-CM | POA: Insufficient documentation

## 2016-03-11 NOTE — Progress Notes (Signed)
Patient ID: RUCKER PRIDGEON, male   DOB: [birth year], 70 y.o.   MRN: [id number]      HPI: MANASES ETCHISON is a 70 y.o. male who presents to the office today for a 2 monthl follow-up evaluation.  Mr. Kiraly presented to [facility] 10/04/2015 with an acute ST segment elevation myocardial infarction secondary to total proximal occlusion of a large, dominant RCA.  He had a prior history of tobacco use and COPD in 2005 underwent laryngectomy for laryngeal CA.  There was no prior history of ischemic heart disease.  He was taken emergently to the catheterization laboratory and underwent successful PCI/DES stenting of a large dominant RCA with ultimate insertion of a 4.032 mm Synergy DES stent postdilated to 4.6 mm with the 100 percent occlusion reduced to 0% and restoration of brisk TIMI-3 flow.  Initial ejection fraction was 50% with mild mid inferior wall hypocontractility.  Post catheterization he developed cardiogenic shock and hypotension, which initially was refractory to IV fluid resuscitation and required dopamine.  A chest CT showed emphysematous disease.  He was seen by [name] on 2 occasions post hospitalization with the most recent being in December 2016.  An echo Doppler study at that time showed an EF of 50-55% with akinesis of the basal inferior myocardium and grade 1 diastolic dysfunction.  There was mild aortic sclerosis, mild mitral and trivial tricuspid regurgitation.  Blood pressure has been low and as result, he is only been able to be on very minimal medical therapy consisting of carvedilol one half of a 3.125 mg pill twice a day.  Initially he was on Brilinta but this was changed to Plavix due to shortness of breath issues which were felt possibly contributed to Brilinta.  A P2Y12 blood test was 210.  Since I saw him several months ago for his initial post MI evaluation.  He has continued to do well.  He denies any exertional type chest pain.  He had experienced a vague pain in  his chest but admits that this was a different sensation than he felt with his myocardial infarction.  He has had issues with allergic rhinitis, particularly with the high pollen content.  He is unaware of any palpitations.  He denies PND, orthopnea.  He denies myalgias.  There is no bleeding.  He presents for evaluation.  Past Medical History  Diagnosis Date  . 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 Tobacco abuse   . COPD (chronic obstructive pulmonary disease) ([place])   . STEMI (ST elevation myocardial infarction) [facility]) November 2016    with PCI to RCA and associated cardiogenic shock  . HLD (hyperlipidemia)     Past Surgical History  Procedure Laterality Date  . Appendectomy    . Throat surgery  2005    CA  . Cardiac catheterization N/A 10/04/2015    Procedure: Left Heart Cath and Coronary Angiography;  Surgeon: Troy Sine, MD;  Location: [place] CV LAB;  Service: Cardiovascular;  Laterality: N/A;  . Cardiac catheterization N/A 10/04/2015    Procedure: Coronary Stent Intervention;  Surgeon: Troy Sine, MD;  Location: [place] CV LAB;  Service: Cardiovascular;  Laterality: N/A;    No Known Allergies  Current Outpatient Prescriptions  Medication Sig Dispense Refill  . albuterol (PROVENTIL HFA;VENTOLIN HFA) 108 (90 BASE) MCG/ACT inhaler Inhale 1-2 puffs into the lungs every 4 (four) hours as needed for wheezing or  shortness of breath (or coughing). 1 Inhaler 0  . aspirin EC 81 MG EC tablet Take 1 tablet (81 mg total) by mouth daily.    Marland Kitchen atorvastatin (LIPITOR) 80 MG tablet Take 1 tablet (80 mg total) by mouth daily at 6 PM. 30 tablet 11  . benzonatate (TESSALON) 200 MG capsule TAKE 1 CAPSULE (200 MG TOTAL) BY MOUTH 3 (THREE) TIMES DAILY AS NEEDED FOR COUGH. 30 capsule 1  . carvedilol (COREG) 3.125 MG tablet Take 1 .5 tablets twice a day 90 tablet 11  . clopidogrel (PLAVIX) 75 MG tablet Take 1 tablet (75 mg  total) by mouth daily. 30 tablet 6  . nitroGLYCERIN (NITROSTAT) 0.4 MG SL tablet Place 1 tablet (0.4 mg total) under the tongue every 5 (five) minutes as needed for chest pain (CP or SOB). 25 tablet 12  . ezetimibe (ZETIA) 10 MG tablet Take 1 tablet (10 mg total) by mouth daily. 30 tablet 11   No current facility-administered medications for this visit.    Social History   Social History  . Marital Status: Married    Spouse Name: N/A  . Number of Children: N/A  . Years of Education: N/A   Occupational History  . retired    Social History Main Topics  . Smoking status: Former Smoker -- 1.50 packs/day for 40 years    Types: Cigarettes    Quit date: 08/20/2003  . Smokeless tobacco: Not on file  . Alcohol Use: No  . Drug Use: No  . Sexual Activity: Not Currently   Other Topics Concern  . Not on file   Social History Narrative   Socially he is married.  He quit smoking 13 years ago.  Family History  Problem Relation Age of Onset  . Cancer - Other Mother   . Brain cancer Father   . CAD Brother     ROS General: Negative; No fevers, chills, or night sweats HEENT: Negative; No changes in vision or hearing, sinus congestion, difficulty swallowing Pulmonary: Positive for shortness of breath Cardiovascular: See HPI: No chest pain, presyncope, syncope, palpatations GI: Negative; No nausea, vomiting, diarrhea, or abdominal pain GU: Negative; No dysuria, hematuria, or difficulty voiding Musculoskeletal: Negative; no myalgias, joint pain, or weakness Hematologic/Oncology: History of throat cancer of left vocal cord, status post laryngectomy in 2005 Endocrine: Negative; no heat/cold intolerance; no diabetes, Neuro: Negative; no changes in balance, headaches Skin: Negative; No rashes or skin lesions Psychiatric: Negative; No behavioral problems, depression Sleep: Negative; No snoring,  daytime sleepiness, hypersomnolence, bruxism, restless legs, hypnogognic hallucinations. Other  comprehensive 14 point system review is negative   Physical Exam BP 98/70 mmHg  Pulse 72  Ht _0  (1.778 m)  Wt 174 lb (78.926 kg)  BMI 24.97 kg/m2   Repeat blood pressure by me 98/70 supine and 102/70 standing.  Wt Readings from Last 3 Encounters:  03/09/16 174 lb (78.926 kg)  01/10/16 173 lb (78.472 kg)  10/25/15 166 lb (75.297 kg)   General: Alert, oriented, no distress.  Skin: normal turgor, no rashes, warm and dry HEENT: Normocephalic, atraumatic. Pupils equal round and reactive to light; sclera anicteric; extraocular muscles intact, No lid lag; Nose without nasal septal hypertrophy; Mouth/Parynx benign;  Status post laryngectomy Neck: No JVD, no carotid bruits; normal carotid upstroke Lungs: Slightly decreased breath sounds bilaterally; no wheezing or rales, normal inspiratory and expiratory effort Chest wall: without tenderness to palpitation Heart: PMI not displaced, RRR, s1 s2 normal, 1/6 systolic murmur, No diastolic murmur, no rubs,  gallops, thrills, or heaves Abdomen: soft, nontender; no hepatosplenomehaly, BS+; abdominal aorta nontender and not dilated by palpation. Back: no CVA tenderness Pulses: 2+  Musculoskeletal: full range of motion, normal strength, no joint deformities Extremities: Pulses 2+, no clubbing cyanosis or edema, Homan's sign negative  Neurologic: grossly nonfocal; Cranial nerves grossly wnl Psychologic: Normal mood and affect  ECG (independently read by me): Normal sinus rhythm at 72 bpm.  T-wave abnormality infererolaterally.   ECG (independently read by me): Normal sinus rhythm.  Left anterior hemiblock.  Inferior Q waves with ST- T-wave abnormalities inferolaterally  LABS:  BMP Latest Ref Rng 01/13/2016 10/24/2015 10/12/2015  Glucose 65 - 99 mg/dL 109(H) 85 75  BUN 6 - 20 mg/dL _0 Creatinine 0.61 - 1.24 mg/dL 1.02 0.98 0.94  Sodium 135 - 145 mmol/L 139 137 136  Potassium 3.5 - 5.1 mmol/L 4.1 4.1 4.6  Chloride 101 - 111 mmol/L 105  101 101  CO2 22 - 32 mmol/L _1 Calcium 8.9 - 10.3 mg/dL 9.4 9.3 8.8     Hepatic Function Latest Ref Rng 01/13/2016 10/24/2015 10/12/2015  Total Protein 6.5 - 8.1 g/dL 6.8 7.1 6.6  Albumin 3.5 - 5.0 g/dL 3.8 3.8 3.5(L)  AST 15 - 41 U/L _2 ALT 17 - 63 U/L _3 Alk Phosphatase 38 - 126 U/L 72 86 79  Total Bilirubin 0.3 - 1.2 mg/dL 0.9 0.6 0.7  Bilirubin, Direct <=0.2 mg/dL - 0.2 0.1    CBC Latest Ref Rng 01/13/2016 10/12/2015 10/07/2015  WBC 4.0 - 10.5 K/uL 6.7 10.9(H) 10.4  Hemoglobin 13.0 - 17.0 g/dL 15.0 14.5 14.1  Hematocrit 39.0 - 52.0 % 44.3 40.5 42.7  Platelets 150 - 400 K/uL 251 292 196   Lab Results  Component Value Date   MCV 89.5 01/13/2016   MCV 86.4 10/12/2015   MCV 90.5 10/07/2015    Lab Results  Component Value Date   TSH 4.536* 10/12/2015    BNP No results found for: BNP  ProBNP    Component Value Date/Time   PROBNP 134.4* 04/03/2013 2327     Lipid Panel     Component Value Date/Time   CHOL 241* 01/13/2016 1015   TRIG 256* 01/13/2016 1015   HDL 44 01/13/2016 1015   CHOLHDL 5.5 01/13/2016 1015   VLDL 51* 01/13/2016 1015   LDLCALC 146* 01/13/2016 1015     RADIOLOGY: No results found.    ASSESSMENT AND PLAN: Mr. Lachman is a 70 year old WM who is suffered an acute inferior ST segment elevation myocardial infarction secondary to total proximal occlusion of a very large dominant RCA in November 2016.  Subsequent ejection fraction has improved to 50-55% on echocardiography with basal inferior akinesis.  He has a history of COPD with emphysematous changes noted on CT scan which may be contributory to some of his shortness of breath.  He is now on Plavix due to also potential concerns that Brilinta was contributing to some dyspnea.  He believes his shortness of breath has improved.  He was Plavix responsive.  He is not having anginal symptoms.  His resting pulse is in the 70s.  His blood pressure has tended to be on the low side.  I  will try to slightly titrate his carvedilol and will increase this to 1-1/2 pills twice a day of his current 3.125 mg pill.  His laboratory was reviewed.  He continues to be on atorvastatin for hyperlipidemia.  I  am adding Zetia 10 mg to obtain further LDL reduction of at least 20 additional percent.  With his high triglycerides.  He will also initiate omega-3 fatty acids.  Repeat laboratory will be obtained in 3 months and I will see him in the office in 3-4 months for reevaluation.  Time spent: 25 minutes  Troy Sine, MD, [facility]  03/11/2016 10:40 AM

## 2016-03-12 ENCOUNTER — Telehealth: Payer: Self-pay

## 2016-03-12 NOTE — Telephone Encounter (Signed)
Rx(s) sent to pharmacy electronically.  Spoke with pharmacist they did not receive rx for med that was sent 10/07/2015;

## 2016-03-16 NOTE — Telephone Encounter (Signed)
Left voicemail message for patient to call back.

## 2016-03-19 ENCOUNTER — Telehealth: Payer: Self-pay

## 2016-03-19 NOTE — Telephone Encounter (Signed)
Please call Ms Caton at [phone].

## 2016-03-19 NOTE — Telephone Encounter (Signed)
See previous phone note.  

## 2016-03-19 NOTE — Telephone Encounter (Signed)
Spoke with patient's wife about Ezetimibe. It will cost him about $200.00/month. I questioned her if he had really been taking the Atorvastain 80 mg daily. She said there was a "mixup" with his meds, and he had actually had not taken it consistently. Probably took it for about 4 weeks prior to that last Lipid profile. Advised I will forward to Dr. [name] for his advise.

## 2016-03-19 NOTE — Telephone Encounter (Signed)
LMTCB 03/19/16

## 2016-03-20 ENCOUNTER — Telehealth: Payer: Self-pay | Admitting: Cardiovascular Disease

## 2016-03-20 NOTE — Telephone Encounter (Signed)
FORWARD TO WANDA CMA 

## 2016-03-20 NOTE — Telephone Encounter (Signed)
New message      Pt c/o medication issue:  1. Name of Medication: zetia 2. How are you currently taking this medication (dosage and times per day)? 10mg  daily 3. Are you having a reaction (difficulty breathing--STAT)? no 4. What is your medication issue? Medicine is too expensive----270.00 for 30 days Did Dr [name] know pt had been off atorvastatin for about 3 weeks when he had his most recent lipid panel?  This is why he added the zetia.  Pt did not get zetia because of price. Please call

## 2016-03-20 NOTE — Telephone Encounter (Signed)
Returned a call to patient's wife. Informed her per VO from Dr [name] to take atorvastatin daily as prescribed. We will recheck blood work in a few months. Wife voice understanding saying that the patient had been off of the atorvastatin 3-4 weeks before having his lipid panel drawn.

## 2016-03-21 NOTE — Telephone Encounter (Signed)
Spoke with patient's wife yesterday. See previous phone message.

## 2016-06-13 ENCOUNTER — Other Ambulatory Visit: Payer: Self-pay | Admitting: Cardiovascular Disease

## 2016-06-25 ENCOUNTER — Encounter: Payer: Self-pay | Admitting: Cardiovascular Disease

## 2016-06-25 ENCOUNTER — Encounter (INDEPENDENT_AMBULATORY_CARE_PROVIDER_SITE_OTHER): Payer: Self-pay

## 2016-06-25 ENCOUNTER — Ambulatory Visit (INDEPENDENT_AMBULATORY_CARE_PROVIDER_SITE_OTHER): Payer: Medicare Other | Admitting: Cardiovascular Disease

## 2016-06-25 VITALS — BP 98/66 | HR 57 | Ht 70.0 in | Wt 172.0 lb

## 2016-06-25 DIAGNOSIS — I2510 Atherosclerotic heart disease of native coronary artery without angina pectoris: Secondary | ICD-10-CM | POA: Diagnosis not present

## 2016-06-25 DIAGNOSIS — Z79899 Other long term (current) drug therapy: Secondary | ICD-10-CM | POA: Diagnosis not present

## 2016-06-25 DIAGNOSIS — C329 Malignant neoplasm of larynx, unspecified: Secondary | ICD-10-CM

## 2016-06-25 DIAGNOSIS — I2111 ST elevation (STEMI) myocardial infarction involving right coronary artery: Secondary | ICD-10-CM | POA: Diagnosis not present

## 2016-06-25 DIAGNOSIS — E785 Hyperlipidemia, unspecified: Secondary | ICD-10-CM

## 2016-06-25 NOTE — Patient Instructions (Signed)
Your physician wants you to follow-up in: 6 months or sooner if needed. You will receive a reminder letter in the mail two months in advance. If you don't receive a letter, please call our office to schedule the follow-up appointment.   Your physician recommends that you return for lab work fasting.  If you need a refill on your cardiac medications before your next appointment, please call your pharmacy.

## 2016-06-26 ENCOUNTER — Encounter: Payer: Self-pay | Admitting: Cardiovascular Disease

## 2016-06-26 DIAGNOSIS — I2510 Atherosclerotic heart disease of native coronary artery without angina pectoris: Secondary | ICD-10-CM | POA: Insufficient documentation

## 2016-06-26 NOTE — Progress Notes (Signed)
Patient ID: SHAYMUS EVELETH, male   DOB: 1946-09-17, 70 y.o.   MRN: [id number]      HPI: KENTO GOSSMAN is a 70 y.o. male who presents to the office today for a 4 month follow-up evaluation.  Mr. Sheerin presented to [facility] 10/04/2015 with an acute ST segment elevation myocardial infarction secondary to total proximal occlusion of a large, dominant RCA.  He had a prior history of tobacco use and COPD in 2005 underwent laryngectomy for laryngeal CA.  There was no prior history of ischemic heart disease.  He was taken emergently to the catheterization laboratory and underwent successful PCI/DES stenting of a large dominant RCA with ultimate insertion of a 4.032 mm Synergy DES stent postdilated to 4.6 mm with the 100 percent occlusion reduced to 0% and restoration of brisk TIMI-3 flow.  Initial ejection fraction was 50% with mild mid inferior wall hypocontractility.  Post catheterization he developed cardiogenic shock and hypotension, which initially was refractory to IV fluid resuscitation and required dopamine.  A chest CT showed emphysematous disease.  He was seen by [name] on 2 occasions post hospitalization with the most recent being in December 2016.  An echo Doppler study at that time showed an EF of 50-55% with akinesis of the basal inferior myocardium and grade 1 diastolic dysfunction.  There was mild aortic sclerosis, mild mitral and trivial tricuspid regurgitation.  Blood pressure has been low and as result, he is only been able to be on very minimal medical therapy consisting of carvedilol one half of a 3.125 mg pill twice a day.  Initially he was on Brilinta but this was changed to Plavix due to shortness of breath issues which were felt possibly contributed to Brilinta.  A P2Y12 blood test was 210.   He has continued to do well.  He denies any exertional type chest pain.  He had experienced a vague pain in his chest but admits that this was a different sensation than he felt  with his myocardial infarction.  He has had issues with allergic rhinitis, particularly with the high pollen content.  Recently, he has been developing right hip discomfort and low back pain and has had some associated sciatica symptomatology.  He is unaware of any palpitations.  He denies PND, orthopnea.  He denies myalgias.  There is no bleeding.  When I last saw him, his LDL had risen to 146 and at that time recommended the addition of Zetia to his atorvastatin.  Apparently he had not taken the atorvastatin for month prior to that blood work.  As result, he resume taking the atorvastatin at 80 mg daily and never started the Zetia.  He's not had follow-up blood work.  He presents for reevaluation.  Past Medical History:  Diagnosis Date  . COPD (chronic obstructive pulmonary disease) ([place])   . HLD (hyperlipidemia)   . STEMI (ST elevation myocardial infarction) [facility]) November 2016   with PCI to RCA and associated cardiogenic shock  . 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 Tobacco abuse     Past Surgical History:  Procedure Laterality Date  . APPENDECTOMY    . CARDIAC CATHETERIZATION N/A 10/04/2015   Procedure: Left Heart Cath and Coronary Angiography;  Surgeon: Troy Sine, MD;  Location: [place] [facility];  Service: Cardiovascular;  Laterality: N/A;  . CARDIAC CATHETERIZATION N/A 10/04/2015   Procedure: Coronary Stent Intervention;  Surgeon: Marcello Moores  Floyce Stakes, MD;  Location: [place] [facility];  Service: Cardiovascular;  Laterality: N/A;  . THROAT SURGERY  2005   [place]    No Known Allergies  Current Outpatient Prescriptions  Medication Sig Dispense Refill  . albuterol (PROVENTIL HFA;VENTOLIN HFA) 108 (90 BASE) MCG/ACT inhaler Inhale 1-2 puffs into the lungs every 4 (four) hours as needed for wheezing or shortness of breath (or coughing). 1 Inhaler 0  . aspirin EC 81 MG EC tablet Take 1 tablet (81 mg total) by mouth daily.      Marland Kitchen atorvastatin (LIPITOR) 80 MG tablet TAKE 1 TABLET (80 MG TOTAL) BY MOUTH DAILY AT 6 PM. 30 tablet 8  . benzonatate (TESSALON) 200 MG capsule TAKE 1 CAPSULE (200 MG TOTAL) BY MOUTH 3 (THREE) TIMES DAILY AS NEEDED FOR COUGH. 30 capsule 1  . carvedilol (COREG) 3.125 MG tablet Take 1 .5 tablets twice a day 90 tablet 11  . clopidogrel (PLAVIX) 75 MG tablet TAKE 1 TABLET (75 MG TOTAL) BY MOUTH DAILY. 30 tablet 6  . ezetimibe (ZETIA) 10 MG tablet Take 1 tablet (10 mg total) by mouth daily. 30 tablet 11  . nitroGLYCERIN (NITROSTAT) 0.4 MG SL tablet Place 1 tablet (0.4 mg total) under the tongue every 5 (five) minutes as needed for chest pain (CP or SOB). 25 tablet 12   No current facility-administered medications for this visit.     Social History   Social History  . Marital status: Married    Spouse name: N/A  . Number of children: N/A  . Years of education: N/A   Occupational History  . retired    Social History Main Topics  . Smoking status: Former Smoker    Packs/day: 1.50    Years: 40.00    Types: Cigarettes    Quit date: 08/20/2003  . Smokeless tobacco: Not on file  . Alcohol use No  . Drug use: No  . Sexual activity: Not Currently   Other Topics Concern  . Not on file   Social History Narrative  . No narrative on file   Socially he is married.  He quit smoking 13 years ago.  Family History  Problem Relation Age of Onset  . Cancer - Other Mother   . Brain cancer Father   . CAD Brother     ROS General: Negative; No fevers, chills, or night sweats HEENT: Negative; No changes in vision or hearing, sinus congestion, difficulty swallowing Pulmonary: Positive for shortness of breath Cardiovascular: See HPI: No chest pain, presyncope, syncope, palpatations GI: Negative; No nausea, vomiting, diarrhea, or abdominal pain GU: Negative; No dysuria, hematuria, or difficulty voiding Musculoskeletal: Positive for occasional low back pain, right hip pain and sciatica  discomfort. Hematologic/Oncology: History of throat cancer of left vocal cord, status post laryngectomy in 2005 Endocrine: Negative; no heat/cold intolerance; no diabetes, Neuro: Negative; no changes in balance, headaches Skin: Negative; No rashes or skin lesions Psychiatric: Negative; No behavioral problems, depression Sleep: Negative; No snoring,  daytime sleepiness, hypersomnolence, bruxism, restless legs, hypnogognic hallucinations. Other comprehensive 14 point system review is negative   Physical Exam BP 98/66   Pulse (!) 57   Ht '5\' 10"'$  (1.778 m)   Wt 172 lb (78 kg)   BMI 24.68 kg/m    Repeat blood pressure by me 98/70 supine and 102/70 standing.  Wt Readings from Last 3 Encounters:  06/25/16 172 lb (78 kg)  03/09/16 174 lb (78.9 kg)  01/10/16 173 lb (78.5 kg)   General: Alert,  oriented, no distress.  Skin: normal turgor, no rashes, warm and dry HEENT: Normocephalic, atraumatic. Pupils equal round and reactive to light; sclera anicteric; extraocular muscles intact, No lid lag; Nose without nasal septal hypertrophy; Mouth/Parynx benign;  Status post laryngectomy Neck: No JVD, no carotid bruits; normal carotid upstroke Lungs: Slightly decreased breath sounds bilaterally; no wheezing or rales, normal inspiratory and expiratory effort Chest wall: without tenderness to palpitation Heart: PMI not displaced, RRR, s1 s2 normal, 1/6 systolic murmur, No diastolic murmur, no rubs, gallops, thrills, or heaves Abdomen: soft, nontender; no hepatosplenomehaly, BS+; abdominal aorta nontender and not dilated by palpation. Back: no CVA tenderness Pulses: 2+  Musculoskeletal: full range of motion, normal strength, no joint deformities Extremities: Pulses 2+, no clubbing cyanosis or edema, Homan's sign negative  Neurologic: grossly nonfocal; Cranial nerves grossly wnl Psychologic: Normal mood and affect  ECG (independently read by me): Sinus bradycardia 57 bpm.  Inferior infarct with Q waves  and ST changes.  Left anterior hemiblock.  April 2017 ECG (independently read by me): Normal sinus rhythm at 72 bpm.  T-wave abnormality infererolaterally.  February 2017 ECG (independently read by me): Normal sinus rhythm.  Left anterior hemiblock.  Inferior Q waves with ST- T-wave abnormalities inferolaterally  LABS:  BMP Latest Ref Rng & Units 01/13/2016 10/24/2015 10/12/2015  Glucose 65 - 99 mg/dL 109(H) 85 75  BUN 6 - 20 mg/dL '20 14 18  '$ Creatinine 0.61 - 1.24 mg/dL 1.02 0.98 0.94  Sodium 135 - 145 mmol/L 139 137 136  Potassium 3.5 - 5.1 mmol/L 4.1 4.1 4.6  Chloride 101 - 111 mmol/L 105 101 101  CO2 22 - 32 mmol/L '24 26 25  '$ Calcium 8.9 - 10.3 mg/dL 9.4 9.3 8.8     Hepatic Function Latest Ref Rng & Units 01/13/2016 10/24/2015 10/12/2015  Total Protein 6.5 - 8.1 g/dL 6.8 7.1 6.6  Albumin 3.5 - 5.0 g/dL 3.8 3.8 3.5(L)  AST 15 - 41 U/L '30 23 16  '$ ALT 17 - 63 U/L '28 17 16  '$ Alk Phosphatase 38 - 126 U/L 72 86 79  Total Bilirubin 0.3 - 1.2 mg/dL 0.9 0.6 0.7  Bilirubin, Direct <=0.2 mg/dL - 0.2 0.1    CBC Latest Ref Rng & Units 01/13/2016 10/12/2015 10/07/2015  WBC 4.0 - 10.5 K/uL 6.7 10.9(H) 10.4  Hemoglobin 13.0 - 17.0 g/dL 15.0 14.5 14.1  Hematocrit 39.0 - 52.0 % 44.3 40.5 42.7  Platelets 150 - 400 K/uL 251 292 196   Lab Results  Component Value Date   MCV 89.5 01/13/2016   MCV 86.4 10/12/2015   MCV 90.5 10/07/2015    Lab Results  Component Value Date   TSH 4.536 (H) 10/12/2015    BNP No results found for: BNP  ProBNP    Component Value Date/Time   PROBNP 134.4 (H) 04/03/2013 2327     Lipid Panel     Component Value Date/Time   CHOL 241 (H) 01/13/2016 1015   TRIG 256 (H) 01/13/2016 1015   HDL 44 01/13/2016 1015   CHOLHDL 5.5 01/13/2016 1015   VLDL 51 (H) 01/13/2016 1015   LDLCALC 146 (H) 01/13/2016 1015     RADIOLOGY: No results found.    ASSESSMENT AND PLAN: Mr. Ricciardi is a 70 year old WM who is suffered an acute inferior ST segment elevation  myocardial infarction secondary to total proximal occlusion of a very large dominant RCA in November 2016.  Subsequent ejection fraction has improved to 50-55% on echocardiography with basal inferior akinesis.  He has a history of COPD with emphysematous changes noted on CT scan which may be contributory to some of his shortness of breath.  He is  on Plavix due to also potential concerns that Brilinta was contributing to some dyspnea.  He believes his shortness of breath has improved.  He was Plavix responsive. When I last saw him, I further titrated his carvedilol and he now is taking this at 1-1/2 of a 3.125 mg pill twice a day.  His resting pulse is 57.  He is now back on atorvastatin 80 mg and never started Zetia.  Repeat laboratory will be obtained to make certain his LDL is at target.  He continues to be on dual antiplatelet therapy with aspirin and Plavix and denies bleeding.  His blood pressure today is controlled.  He's not having any signs or symptoms of volume overload.  I will see him in 6 months for reevaluation.  Time spent: 25 minutes  Troy Sine, MD, [facility]  06/26/2016 8:31 PM

## 2016-06-30 ENCOUNTER — Other Ambulatory Visit: Payer: Self-pay | Admitting: Cardiovascular Disease

## 2017-01-03 ENCOUNTER — Ambulatory Visit (INDEPENDENT_AMBULATORY_CARE_PROVIDER_SITE_OTHER): Payer: Medicare HMO | Admitting: Cardiovascular Disease

## 2017-01-03 ENCOUNTER — Encounter: Payer: Self-pay | Admitting: Cardiovascular Disease

## 2017-01-03 VITALS — BP 108/78 | HR 62 | Ht 70.0 in | Wt 170.0 lb

## 2017-01-03 DIAGNOSIS — E785 Hyperlipidemia, unspecified: Secondary | ICD-10-CM | POA: Diagnosis not present

## 2017-01-03 DIAGNOSIS — Z8521 Personal history of malignant neoplasm of larynx: Secondary | ICD-10-CM

## 2017-01-03 DIAGNOSIS — I2510 Atherosclerotic heart disease of native coronary artery without angina pectoris: Secondary | ICD-10-CM | POA: Diagnosis not present

## 2017-01-03 DIAGNOSIS — Z79899 Other long term (current) drug therapy: Secondary | ICD-10-CM

## 2017-01-03 DIAGNOSIS — N529 Male erectile dysfunction, unspecified: Secondary | ICD-10-CM

## 2017-01-03 NOTE — Patient Instructions (Addendum)
Your physician wants you to follow-up in: 6 months or sooner if needed. You will receive a reminder letter in the mail two months in advance. If you don't receive a letter, please call our office to schedule the follow-up appointment.   If you need a refill on your cardiac medications before your next appointment, please call your pharmacy. 

## 2017-01-03 NOTE — Progress Notes (Signed)
Patient ID: Cory Sloan, male   DOB: Dec 25, 1945, 71 y.o.   MRN: [id number]      HPI: Cory Sloan is a 71 y.o. male who presents to the office today for a 6 month follow-up evaluation.  Cory Sloan presented to [facility] 10/04/2015 with an acute ST segment elevation myocardial infarction secondary to total proximal occlusion of a large, dominant RCA.  He had a prior history of tobacco use and COPD in 2005 underwent laryngectomy for laryngeal CA.  There was no prior history of ischemic heart disease.  He was taken emergently to the catheterization laboratory and underwent successful PCI/DES stenting of a large dominant RCA with ultimate insertion of a 4.032 mm Synergy DES stent postdilated to 4.6 mm with the 100 percent occlusion reduced to 0% and restoration of brisk TIMI-3 flow.  Initial ejection fraction was 50% with mild mid inferior wall hypocontractility.  Post catheterization he developed cardiogenic shock and hypotension, which initially was refractory to IV fluid resuscitation and required dopamine.  A chest CT showed emphysematous disease.  He was seen by [name] on 2 occasions post hospitalization with the most recent being in December 2016.  An echo Doppler study at that time showed an EF of 50-55% with akinesis of the basal inferior myocardium and grade 1 diastolic dysfunction.  There was mild aortic sclerosis, mild mitral and trivial tricuspid regurgitation.  Blood pressure has been low and as result, he is only been able to be on very minimal medical therapy consisting of carvedilol one half of a 3.125 mg pill twice a day.  Initially he was on Brilinta but this was changed to Plavix due to shortness of breath issues which were felt possibly contributed to Brilinta.  A P2Y12 blood test was 210.  When I  saw him in 2017, his LDL had risen to 146 and at that time recommended the addition of Zetia to his atorvastatin.  Apparently he had not taken the atorvastatin for month  prior to that blood work.  As result, he resume taking the atorvastatin at 80 mg daily and never started the Zetia.    When I last saw him, I recommended that he obtain follow-up fasting lab work.  He never had this done.  Over the past 6 months, he has continued to do well from a cardiovascular standpoint.  He denies exertional chest pain.  At times he notes some twinges of atypical chest pain.  Typically when seated.  In the past.  He also had some right hip discomfort and low back pain.  He denies PND, orthopnea.  He denies palpitations.  He denies myalgias.  He presents for evaluation  Past Medical History:  Diagnosis Date  . COPD (chronic obstructive pulmonary disease) ([place])   . HLD (hyperlipidemia)   . STEMI (ST elevation myocardial infarction) [facility]) November 2016   with PCI to RCA and associated cardiogenic shock  . 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 Tobacco abuse     Past Surgical History:  Procedure Laterality Date  . APPENDECTOMY    . CARDIAC CATHETERIZATION N/A 10/04/2015   Procedure: Left Heart Cath and Coronary Angiography;  Surgeon: Troy Sine, MD;  Location: [place] [facility];  Service: Cardiovascular;  Laterality: N/A;  . CARDIAC CATHETERIZATION N/A 10/04/2015   Procedure: Coronary Stent Intervention;  Surgeon: Troy Sine, MD;  Location: [place] [facility];  Service: Cardiovascular;  Laterality: N/A;  . THROAT SURGERY  2005   [place]    No Known Allergies  Current Outpatient Prescriptions  Medication Sig Dispense Refill  . albuterol (PROVENTIL HFA;VENTOLIN HFA) 108 (90 BASE) MCG/ACT inhaler Inhale 1-2 puffs into the lungs every 4 (four) hours as needed for wheezing or shortness of breath (or coughing). 1 Inhaler 0  . aspirin EC 81 MG EC tablet Take 1 tablet (81 mg total) by mouth daily.    . atorvastatin (LIPITOR) 80 MG tablet TAKE 1 TABLET (80 MG TOTAL) BY MOUTH DAILY AT 6 PM. 30 tablet 8  .  benzonatate (TESSALON) 200 MG capsule TAKE 1 CAPSULE (200 MG TOTAL) BY MOUTH 3 (THREE) TIMES DAILY AS NEEDED FOR COUGH. 30 capsule 1  . carvedilol (COREG) 3.125 MG tablet Take 1 .5 tablets twice a day 90 tablet 11  . clopidogrel (PLAVIX) 75 MG tablet TAKE 1 TABLET (75 MG TOTAL) BY MOUTH DAILY. 30 tablet 6  . nitroGLYCERIN (NITROSTAT) 0.4 MG SL tablet Place 1 tablet (0.4 mg total) under the tongue every 5 (five) minutes as needed for chest pain (CP or SOB). 25 tablet 12   No current facility-administered medications for this visit.     Social History   Social History  . Marital status: Married    Spouse name: N/A  . Number of children: N/A  . Years of education: N/A   Occupational History  . retired    Social History Main Topics  . Smoking status: Former Smoker    Packs/day: 1.50    Years: 40.00    Types: Cigarettes    Quit date: 08/20/2003  . Smokeless tobacco: Never Used  . Alcohol use No  . Drug use: No  . Sexual activity: Not Currently   Other Topics Concern  . Not on file   Social History Narrative  . No narrative on file   Socially he is married.  He quit smoking 13 years ago.  Family History  Problem Relation Age of Onset  . Cancer - Other Mother   . Brain cancer Father   . CAD Brother     ROS General: Negative; No fevers, chills, or night sweats HEENT: Negative; No changes in vision or hearing, sinus congestion, difficulty swallowing Pulmonary: Positive for shortness of breath Cardiovascular: See HPI: No chest pain, presyncope, syncope, palpatations GI: Negative; No nausea, vomiting, diarrhea, or abdominal pain GU: Negative; No dysuria, hematuria, or difficulty voiding Musculoskeletal: Positive for occasional low back pain, right hip pain and sciatica discomfort. Hematologic/Oncology: History of throat cancer of left vocal cord, status post laryngectomy in 2005 Endocrine: Negative; no heat/cold intolerance; no diabetes, Neuro: Negative; no changes in  balance, headaches Skin: Negative; No rashes or skin lesions Psychiatric: Negative; No behavioral problems, depression Sleep: Negative; No snoring,  daytime sleepiness, hypersomnolence, bruxism, restless legs, hypnogognic hallucinations. Other comprehensive 14 point system review is negative   Physical Exam BP 108/78   Pulse 62   Ht 5' 10" (1.778 m)   Wt 170 lb (77.1 kg)   BMI 24.39 kg/m    Repeat blood pressure by me 110/70  Wt Readings from Last 3 Encounters:  01/03/17 170 lb (77.1 kg)  06/25/16 172 lb (78 kg)  03/09/16 174 lb (78.9 kg)   General: Alert, oriented, no distress.  Skin: normal turgor, no rashes, warm and dry HEENT: Normocephalic, atraumatic. Pupils equal round and reactive to light; sclera anicteric; extraocular muscles intact, No lid lag; Nose without nasal septal hypertrophy; Mouth/Parynx benign;  Status   post laryngectomy Neck: No JVD, no carotid bruits; normal carotid upstroke Lungs: Slightly decreased breath sounds bilaterally; no wheezing or rales, normal inspiratory and expiratory effort Chest wall: without tenderness to palpitation Heart: PMI not displaced, RRR, s1 s2 normal, 1/6 systolic murmur, No diastolic murmur, no rubs, gallops, thrills, or heaves Abdomen: soft, nontender; no hepatosplenomehaly, BS+; abdominal aorta nontender and not dilated by palpation. Back: no CVA tenderness Pulses: 2+  Musculoskeletal: full range of motion, normal strength, no joint deformities Extremities: Pulses 2+, no clubbing cyanosis or edema, Homan's sign negative  Neurologic: grossly nonfocal; Cranial nerves grossly wnl Psychologic: Normal mood and affect  ECG (independently read by me): Normal sinus rhythm at 62 bpm.  Left anterior hemiblock.  Inferior Q waves concordant with his prior MI with preserved R waves in lead 2.  Previously noted inferolateral T wave abnormalities.    August 2017 ECG (independently read by me): Sinus bradycardia 57 bpm.  Inferior infarct with  Q waves and ST changes.  Left anterior hemiblock.  April 2017 ECG (independently read by me): Normal sinus rhythm at 72 bpm.  T-wave abnormality infererolaterally.  February 2017 ECG (independently read by me): Normal sinus rhythm.  Left anterior hemiblock.  Inferior Q waves with ST- T-wave abnormalities inferolaterally  LABS:  BMP Latest Ref Rng & Units 01/13/2016 10/24/2015 10/12/2015  Glucose 65 - 99 mg/dL 109(H) 85 75  BUN 6 - 20 mg/dL _0 Creatinine 0.61 - 1.24 mg/dL 1.02 0.98 0.94  Sodium 135 - 145 mmol/L 139 137 136  Potassium 3.5 - 5.1 mmol/L 4.1 4.1 4.6  Chloride 101 - 111 mmol/L 105 101 101  CO2 22 - 32 mmol/L _1 Calcium 8.9 - 10.3 mg/dL 9.4 9.3 8.8     Hepatic Function Latest Ref Rng & Units 01/13/2016 10/24/2015 10/12/2015  Total Protein 6.5 - 8.1 g/dL 6.8 7.1 6.6  Albumin 3.5 - 5.0 g/dL 3.8 3.8 3.5(L)  AST 15 - 41 U/L _2 ALT 17 - 63 U/L _3 Alk Phosphatase 38 - 126 U/L 72 86 79  Total Bilirubin 0.3 - 1.2 mg/dL 0.9 0.6 0.7  Bilirubin, Direct <=0.2 mg/dL - 0.2 0.1    CBC Latest Ref Rng & Units 01/13/2016 10/12/2015 10/07/2015  WBC 4.0 - 10.5 K/uL 6.7 10.9(H) 10.4  Hemoglobin 13.0 - 17.0 g/dL 15.0 14.5 14.1  Hematocrit 39.0 - 52.0 % 44.3 40.5 42.7  Platelets 150 - 400 K/uL 251 292 196   Lab Results  Component Value Date   MCV 89.5 01/13/2016   MCV 86.4 10/12/2015   MCV 90.5 10/07/2015    Lab Results  Component Value Date   TSH 4.536 (H) 10/12/2015    BNP No results found for: BNP  ProBNP    Component Value Date/Time   PROBNP 134.4 (H) 04/03/2013 2327     Lipid Panel     Component Value Date/Time   CHOL 241 (H) 01/13/2016 1015   TRIG 256 (H) 01/13/2016 1015   HDL 44 01/13/2016 1015   CHOLHDL 5.5 01/13/2016 1015   VLDL 51 (H) 01/13/2016 1015   LDLCALC 146 (H) 01/13/2016 1015     RADIOLOGY: No results found.  IMPRESSION:  1. CAD in native artery   2. Hyperlipidemia LDL goal <70   3. Drug therapy   4. History of  laryngeal cancer   5. Erectile dysfunction, unspecified erectile dysfunction type     ASSESSMENT AND PLAN: Cory Sloan is a 71 year  old WM who is suffered an acute inferior ST segment elevation myocardial infarction secondary to total proximal occlusion of a very large dominant RCA in November 2016.  Subsequent ejection fraction has improved to 50-55% on echocardiography with basal inferior akinesis.  He has a history of COPD with emphysematous changes noted on CT scan which may be contributory to some of his shortness of breath.  He is on Plavix due to also potential concerns that Brilinta was contributing to some dyspnea.  He believes his shortness of breath has improved.  He was Plavix responsive.  His blood pressure today is controlled on carvedilol 3.125 mg twice a day.  He continues to be on atorvastatin 80 mg per never had follow-up blood work.  Fasting laboratory again is strongly recommended and we will try to obtain this in [place] since that is where he lives.  He denies any anginal type symptomatology.  He has noted some atypical sharp twinges of nonexertional chest sensation.  He also has started to notice some mild erectile dysfunction and was wondering if his medications were causing this.  He is not on any medications which would contraindicate use of Viagra or other agents.  I will contact him regarding his blood work.  Adjustments to his medications will be made if possible.  I will see him in 6 months for cardiology evaluation. Time spent: 25 minutes  Troy Sine, MD, [facility]  01/03/2017 1:42 PM

## 2017-01-04 DIAGNOSIS — Z79899 Other long term (current) drug therapy: Secondary | ICD-10-CM | POA: Diagnosis not present

## 2017-01-04 DIAGNOSIS — E785 Hyperlipidemia, unspecified: Secondary | ICD-10-CM | POA: Diagnosis not present

## 2017-01-04 DIAGNOSIS — I2510 Atherosclerotic heart disease of native coronary artery without angina pectoris: Secondary | ICD-10-CM | POA: Diagnosis not present

## 2017-01-04 LAB — COMPREHENSIVE METABOLIC PANEL
ALBUMIN: 3.9 g/dL (ref 3.6–5.1)
ALK PHOS: 64 U/L (ref 40–115)
ALT: 21 U/L (ref 9–46)
AST: 25 U/L (ref 10–35)
BILIRUBIN TOTAL: 0.7 mg/dL (ref 0.2–1.2)
BUN: 12 mg/dL (ref 7–25)
CALCIUM: 8.9 mg/dL (ref 8.6–10.3)
CO2: 26 mmol/L (ref 20–31)
CREATININE: 1.04 mg/dL (ref 0.70–1.25)
Chloride: 108 mmol/L (ref 98–110)
Glucose, Bld: 85 mg/dL (ref 65–99)
Potassium: 4.5 mmol/L (ref 3.5–5.3)
SODIUM: 140 mmol/L (ref 135–146)
TOTAL PROTEIN: 6.3 g/dL (ref 6.1–8.1)

## 2017-01-04 LAB — CBC
HEMATOCRIT: 42.3 % (ref 38.5–50.0)
HEMOGLOBIN: 14.1 g/dL (ref 13.2–17.1)
MCH: 29.5 pg (ref 27.0–33.0)
MCHC: 33.3 g/dL (ref 32.0–36.0)
MCV: 88.5 fL (ref 80.0–100.0)
MPV: 11.6 fL (ref 7.5–12.5)
Platelets: 232 10*3/uL (ref 140–400)
RBC: 4.78 MIL/uL (ref 4.20–5.80)
RDW: 13.8 % (ref 11.0–15.0)
WBC: 5.2 10*3/uL (ref 3.8–10.8)

## 2017-01-04 LAB — LIPID PANEL
CHOLESTEROL: 116 mg/dL (ref <200)
HDL: 44 mg/dL (ref >40)
LDL CALC: 55 mg/dL (ref <100)
TRIGLYCERIDES: 86 mg/dL (ref <150)
Total CHOL/HDL Ratio: 2.6 Ratio (ref <5.0)
VLDL: 17 mg/dL (ref <30)

## 2017-01-04 LAB — TSH: TSH: 5.36 mIU/L — ABNORMAL HIGH (ref 0.40–4.50)

## 2017-01-07 ENCOUNTER — Other Ambulatory Visit: Payer: Self-pay | Admitting: Cardiovascular Disease

## 2017-01-07 ENCOUNTER — Telehealth: Payer: Self-pay | Admitting: *Deleted

## 2017-01-07 NOTE — Telephone Encounter (Signed)
-----   Message from Troy Sine, MD sent at 01/05/2017 10:16 AM EST ----- Labs good; TSH is minimally increased

## 2017-01-07 NOTE — Telephone Encounter (Signed)
Left message for pt to call.

## 2017-01-08 NOTE — Telephone Encounter (Signed)
Returned call to wife (ok per DPR)-made aware of results:  Notes Recorded by Troy Sine, MD on 01/05/2017 at 10:16 AM EST Labs good; TSH is minimally increased   Verbalized understanding and advised to call with further questions or concerns.

## 2017-01-08 NOTE — Telephone Encounter (Signed)
Follow up ° ° ° ° ° °Returning a call to the nurse to get lab results °

## 2017-03-10 ENCOUNTER — Observation Stay (HOSPITAL_COMMUNITY)
Admission: EM | Admit: 2017-03-10 | Discharge: 2017-03-11 | Disposition: A | Discharge status: Home / Self Care | Payer: Medicare HMO | Attending: Internal Medicine | Admitting: Internal Medicine

## 2017-03-10 ENCOUNTER — Emergency Department (HOSPITAL_COMMUNITY): Payer: Medicare HMO

## 2017-03-10 ENCOUNTER — Encounter (HOSPITAL_COMMUNITY): Payer: Self-pay | Admitting: *Deleted

## 2017-03-10 ENCOUNTER — Observation Stay (HOSPITAL_BASED_OUTPATIENT_CLINIC_OR_DEPARTMENT_OTHER): Payer: Medicare HMO

## 2017-03-10 DIAGNOSIS — I712 Thoracic aortic aneurysm, without rupture, unspecified: Secondary | ICD-10-CM | POA: Diagnosis present

## 2017-03-10 DIAGNOSIS — J449 Chronic obstructive pulmonary disease, unspecified: Secondary | ICD-10-CM | POA: Diagnosis not present

## 2017-03-10 DIAGNOSIS — Z85818 Personal history of malignant neoplasm of other sites of lip, oral cavity, and pharynx: Secondary | ICD-10-CM | POA: Insufficient documentation

## 2017-03-10 DIAGNOSIS — I745 Embolism and thrombosis of iliac artery: Secondary | ICD-10-CM | POA: Diagnosis not present

## 2017-03-10 DIAGNOSIS — R0602 Shortness of breath: Secondary | ICD-10-CM | POA: Diagnosis not present

## 2017-03-10 DIAGNOSIS — Z87891 Personal history of nicotine dependence: Secondary | ICD-10-CM | POA: Diagnosis not present

## 2017-03-10 DIAGNOSIS — I252 Old myocardial infarction: Secondary | ICD-10-CM | POA: Insufficient documentation

## 2017-03-10 DIAGNOSIS — I700 Atherosclerosis of aorta: Secondary | ICD-10-CM | POA: Diagnosis present

## 2017-03-10 DIAGNOSIS — R0789 Other chest pain: Secondary | ICD-10-CM | POA: Diagnosis present

## 2017-03-10 DIAGNOSIS — E785 Hyperlipidemia, unspecified: Secondary | ICD-10-CM

## 2017-03-10 DIAGNOSIS — Z79899 Other long term (current) drug therapy: Secondary | ICD-10-CM | POA: Insufficient documentation

## 2017-03-10 DIAGNOSIS — Z7982 Long term (current) use of aspirin: Secondary | ICD-10-CM | POA: Insufficient documentation

## 2017-03-10 DIAGNOSIS — R52 Pain, unspecified: Secondary | ICD-10-CM | POA: Diagnosis not present

## 2017-03-10 DIAGNOSIS — I2510 Atherosclerotic heart disease of native coronary artery without angina pectoris: Secondary | ICD-10-CM | POA: Diagnosis not present

## 2017-03-10 DIAGNOSIS — Z7902 Long term (current) use of antithrombotics/antiplatelets: Secondary | ICD-10-CM | POA: Insufficient documentation

## 2017-03-10 DIAGNOSIS — R079 Chest pain, unspecified: Secondary | ICD-10-CM | POA: Diagnosis not present

## 2017-03-10 DIAGNOSIS — C329 Malignant neoplasm of larynx, unspecified: Secondary | ICD-10-CM | POA: Diagnosis present

## 2017-03-10 LAB — PROTIME-INR
INR: 1.02
Prothrombin Time: 13.4 seconds (ref 11.4–15.2)

## 2017-03-10 LAB — I-STAT CG4 LACTIC ACID, ED
LACTIC ACID, VENOUS: 1.51 mmol/L (ref 0.5–1.9)
Lactic Acid, Venous: 1.45 mmol/L (ref 0.5–1.9)

## 2017-03-10 LAB — I-STAT TROPONIN, ED: Troponin i, poc: 0 ng/mL (ref 0.00–0.08)

## 2017-03-10 LAB — CBC
HEMATOCRIT: 41.6 % (ref 39.0–52.0)
HEMOGLOBIN: 13.7 g/dL (ref 13.0–17.0)
MCH: 29.6 pg (ref 26.0–34.0)
MCHC: 32.9 g/dL (ref 30.0–36.0)
MCV: 89.8 fL (ref 78.0–100.0)
Platelets: 226 10*3/uL (ref 150–400)
RBC: 4.63 MIL/uL (ref 4.22–5.81)
RDW: 13 % (ref 11.5–15.5)
WBC: 9 10*3/uL (ref 4.0–10.5)

## 2017-03-10 LAB — BASIC METABOLIC PANEL
ANION GAP: 7 (ref 5–15)
BUN: 13 mg/dL (ref 6–20)
CALCIUM: 8.8 mg/dL — AB (ref 8.9–10.3)
CHLORIDE: 106 mmol/L (ref 101–111)
CO2: 25 mmol/L (ref 22–32)
Creatinine, Ser: 1.11 mg/dL (ref 0.61–1.24)
GFR calc non Af Amer: >60 mL/min (ref >60)
GLUCOSE: 101 mg/dL — AB (ref 65–99)
Potassium: 3.9 mmol/L (ref 3.5–5.1)
Sodium: 138 mmol/L (ref 135–145)

## 2017-03-10 LAB — TROPONIN I

## 2017-03-10 LAB — APTT: APTT: 30 s (ref 24–36)

## 2017-03-10 MED ORDER — SODIUM CHLORIDE 0.9% FLUSH
3.0000 mL | Freq: Two times a day (BID) | INTRAVENOUS | Status: DC
  Administered 2017-03-10: 3 mL via INTRAVENOUS

## 2017-03-10 MED ORDER — SENNOSIDES-DOCUSATE SODIUM 8.6-50 MG PO TABS: 1.0000 | ORAL_TABLET | Freq: Every evening | ORAL | Status: DC | PRN

## 2017-03-10 MED ORDER — ASPIRIN 81 MG PO CHEW
324.0000 mg | CHEWABLE_TABLET | Freq: Once | ORAL | Status: AC
  Administered 2017-03-10: 243 mg via ORAL

## 2017-03-10 MED ORDER — CARVEDILOL 3.125 MG PO TABS
3.1250 mg | ORAL_TABLET | Freq: Two times a day (BID) | ORAL | Status: DC
  Administered 2017-03-10: 3.125 mg via ORAL
  Filled 2017-03-10: qty 1

## 2017-03-10 MED ORDER — ASPIRIN 81 MG PO TBEC: 81.0000 mg | DELAYED_RELEASE_TABLET | Freq: Every day | ORAL | Status: DC

## 2017-03-10 MED ORDER — IOPAMIDOL (ISOVUE-370) INJECTION 76%
INTRAVENOUS | Status: AC
  Administered 2017-03-10: 100 mL
  Filled 2017-03-10: qty 100

## 2017-03-10 MED ORDER — ACETAMINOPHEN 650 MG RE SUPP: 650.0000 mg | Freq: Four times a day (QID) | RECTAL | Status: DC | PRN

## 2017-03-10 MED ORDER — CLOPIDOGREL BISULFATE 75 MG PO TABS
75.0000 mg | ORAL_TABLET | Freq: Every day | ORAL | Status: DC
  Administered 2017-03-11: 75 mg via ORAL
  Filled 2017-03-10: qty 1

## 2017-03-10 MED ORDER — ASPIRIN 81 MG PO CHEW
81.0000 mg | CHEWABLE_TABLET | Freq: Once | ORAL | Status: DC
  Filled 2017-03-10: qty 1

## 2017-03-10 MED ORDER — SODIUM CHLORIDE 0.9 % IV BOLUS (SEPSIS)
1000.0000 mL | Freq: Once | INTRAVENOUS | Status: AC
  Administered 2017-03-10: 1000 mL via INTRAVENOUS

## 2017-03-10 MED ORDER — ATORVASTATIN CALCIUM 80 MG PO TABS
80.0000 mg | ORAL_TABLET | Freq: Every day | ORAL | Status: DC
  Administered 2017-03-10: 80 mg via ORAL
  Filled 2017-03-10: qty 1

## 2017-03-10 MED ORDER — ACETAMINOPHEN 325 MG PO TABS
650.0000 mg | ORAL_TABLET | Freq: Four times a day (QID) | ORAL | Status: DC | PRN
Start: 2017-03-10 — End: 2017-03-11

## 2017-03-10 MED ORDER — NITROGLYCERIN 2 % TD OINT: 1.0000 [in_us] | TOPICAL_OINTMENT | Freq: Once | TRANSDERMAL | Status: DC

## 2017-03-10 MED ORDER — HEPARIN SODIUM (PORCINE) 5000 UNIT/ML IJ SOLN
5000.0000 [IU] | Freq: Three times a day (TID) | INTRAMUSCULAR | Status: DC
  Administered 2017-03-10 – 2017-03-11 (×2): 5000 [IU] via SUBCUTANEOUS
  Filled 2017-03-10 (×2): qty 1

## 2017-03-10 MED ORDER — ASPIRIN EC 81 MG PO TBEC
81.0000 mg | DELAYED_RELEASE_TABLET | Freq: Every day | ORAL | Status: DC
  Administered 2017-03-11: 81 mg via ORAL
  Filled 2017-03-10: qty 1

## 2017-03-10 MED ORDER — PROMETHAZINE HCL 25 MG PO TABS: 12.5000 mg | ORAL_TABLET | Freq: Four times a day (QID) | ORAL | Status: DC | PRN

## 2017-03-10 MED ORDER — ALBUTEROL SULFATE (2.5 MG/3ML) 0.083% IN NEBU: 3.0000 mL | INHALATION_SOLUTION | RESPIRATORY_TRACT | Status: DC | PRN

## 2017-03-10 MED ORDER — NITROGLYCERIN 0.4 MG SL SUBL: 0.4000 mg | SUBLINGUAL_TABLET | SUBLINGUAL | Status: DC | PRN

## 2017-03-10 NOTE — ED Notes (Signed)
Pt addiment about walking to RR. Family member standing outside of RR for pt if needed.

## 2017-03-10 NOTE — ED Notes (Signed)
Pt returns from radiology. Remains on tele.

## 2017-03-10 NOTE — H&P (Signed)
Date: 03/10/2017               Patient Name:  Cory Sloan MRN: [id number]  DOB: [birth year] Age / Sex: 71 y.o., male   PCP: [place] Service: Internal Medicine Teaching Service         Attending Physician: Dr. Courtney Paris, MD    First Contact: Dr. Reesa Chew Pager: [phone]  Second Contact: Dr. Lorie Phenix Pager: [phone]       After Hours (After 5p/  First Contact Pager: [phone]  weekends / holidays): Second Contact Pager: [phone]   Chief Complaint: Chest pain.  History of Present Illness: [name] is a 71 y.o. 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  According to patient. Initially started on his right side of face and head and then spread to his chest, described as sharp, squeezy 10 out of 10 in intensity, nonradiating, increased with deep breathing and coughing and associated with shortness of breath. He denies any palpitations, exertional dyspnea, nausea, vomiting, diaphoresis, orthopnea or PND.  He denies any worsening cough, upper respiratory symptoms or sick contact.  He denies any abdominal pain, change in his bowel habits, recent change in appetite or weight. He also denies any urinary symptoms.  In the ED he was normotensive, saturating well, EKG without any acute change and first set of troponin negative, he was admitted to rule out ACS.  Meds:  Current Meds  Medication Sig  . albuterol (PROVENTIL HFA;VENTOLIN HFA) 108 (90 BASE) MCG/ACT inhaler Inhale 1-2 puffs into the lungs every 4 (four) hours as needed for wheezing or shortness of breath (or coughing).  Marland Kitchen aspirin EC 81 MG EC tablet Take 1 tablet (81 mg total) by mouth daily.  Marland Kitchen  atorvastatin (LIPITOR) 80 MG tablet TAKE 1 TABLET (80 MG TOTAL) BY MOUTH DAILY AT 6 PM.  . carvedilol (COREG) 3.125 MG tablet Take 1 .5 tablets twice a day  . clopidogrel (PLAVIX) 75 MG tablet TAKE 1 TABLET (75 MG TOTAL) BY MOUTH DAILY.     Allergies: Allergies as of 03/10/2017  . (No Known Allergies)   Past Medical History:  Diagnosis Date  . COPD (chronic obstructive pulmonary disease) ([place])   . HLD (hyperlipidemia)   . STEMI (ST elevation myocardial infarction) [facility]) November 2016   with PCI to RCA and associated cardiogenic shock  . 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 Tobacco abuse     Family History: Extensive family history of heart problem and malignancy in his parents, siblings and daughters.  Social History: Former smoker quit 13 years ago. Denies any alcohol or illicit drug use.  Review of Systems: A complete ROS was negative except as per HPI.   Physical Exam: Blood pressure 125/85, pulse 67, temperature 97.8 F (36.6 C), temperature source Oral, resp. rate 17, height 5\' 11"  (1.803 m), weight 165 lb (74.8 kg), SpO2 97 %. Vitals:   03/10/17 1200 03/10/17 1215 03/10/17 1230 03/10/17 1315  BP: 116/79 127/81 119/88 125/85  Pulse: 71 76 66 67  Resp: 18 17 13 17   Temp:  TempSrc:      SpO2: 96% 98% 98% 97%  Weight:      Height:       General: Vital signs reviewed.  Patient is well-developed and well-nourished, in no acute distress and cooperative with exam.  Head: Normocephalic and atraumatic. Eyes: EOMI, conjunctivae normal, no scleral icterus.  Neck: Supple, Tracheostomy covered with caller ,normal ROM, no JVD, masses, thyromegaly, or carotid bruit present.  Cardiovascular: RRR, S1 normal, S2 normal, no murmurs, gallops, or rubs. Pulmonary/Chest: Coarse basal breath sounds. Abdominal: Soft, non-tender, non-distended, BS +, no masses, organomegaly, or guarding present.  Extremities: No lower  extremity edema bilaterally,  pulses symmetric and intact bilaterally. No cyanosis or clubbing. Neurological: A&O x3, Strength is normal and symmetric bilaterally, cranial nerve II-XII are grossly intact, no focal motor deficit, sensory intact to light touch bilaterally.  Skin: Warm, dry and intact. No rashes or erythema. Psychiatric: Normal mood and affect. speech and behavior is normal. Cognition and memory are normal.  Labs. CBC Latest Ref Rng & Units 03/10/2017 01/04/2017 01/13/2016  WBC 4.0 - 10.5 K/uL 9.0 5.2 6.7  Hemoglobin 13.0 - 17.0 g/dL 13.7 14.1 15.0  Hematocrit 39.0 - 52.0 % 41.6 42.3 44.3  Platelets 150 - 400 K/uL 226 232 251   BMP Latest Ref Rng & Units 03/10/2017 01/04/2017 01/13/2016  Glucose 65 - 99 mg/dL 101(H) 85 109(H)  BUN 6 - 20 mg/dL 13 12 20   Creatinine 0.61 - 1.24 mg/dL 1.11 1.04 1.02  Sodium 135 - 145 mmol/L 138 140 139  Potassium 3.5 - 5.1 mmol/L 3.9 4.5 4.1  Chloride 101 - 111 mmol/L 106 108 105  CO2 22 - 32 mmol/L 25 26 24   Calcium 8.9 - 10.3 mg/dL 8.8(L) 8.9 9.4   PT/INR. 13.4/1.02 Troponin. <0.03  EKG: Sinus rhythm with left anterior fascicular block and Q waves in lateral leads-no acute change.  CXR: FINDINGS: Monitoring leads overlie the patient. Normal cardiac and mediastinal contours. No consolidative pulmonary opacities. No pleural effusion or pneumothorax.  IMPRESSION: No acute cardiopulmonary process.  CT angio chest, abdomen and pelvis. FINDINGS: CTA CHEST FINDINGS  Cardiovascular: Normal heart size. No pericardial effusion. Coronary arterial vascular calcifications. Noncontrast images demonstrate no high attenuation within the thoracic aorta to suggest acute intramural hematoma. Post-contrast images demonstrate no thoracic aortic dissection. The ascending thoracic aorta measures 4 cm, unchanged from prior. The takeoff of the great vessels are patent. Few tiny atherosclerotic ulcers within the descending thoracic aorta (image 115; series  7).  Mediastinum/Nodes: No enlarged axillary, mediastinal or hilar lymphadenopathy. Normal esophagus.  Lungs/Pleura: Postsurgical changes compatible with laryngectomy. Layering debris within the distal trachea. Subpleural ground-glass opacities within the lower lobes bilaterally. Centrilobular and paraseptal emphysematous change. Stable 6 mm fissural nodule within the right mid lung (image 86; series 8), compatible with benign etiology given stability over time. No pleural effusion or pneumothorax.  Musculoskeletal: Thoracic spine degenerative changes. No aggressive or acute appearing osseous lesions.  Review of the MIP images confirms the above findings.  CTA ABDOMEN AND PELVIS FINDINGS  VASCULAR  Aorta: Abdominal aorta is normal in caliber. Peripheral calcified and noncalcified atherosclerotic plaque. There is a focal short-segment dissection within the right common iliac artery (image 223; series 7).  Celiac: Celiac axis is patent.  Branch vessels are patent.  SMA: Superior mesenteric artery is patent.  Renals: Two right-sided and two left-sided renal arteries are patent without significant stenosis.  IMA: Patent without evidence of aneurysm, dissection, vasculitis or significant stenosis.  Veins: No obvious  venous abnormality within the limitations of this arterial phase study.  Review of the MIP images confirms the above findings.  NON-VASCULAR  Hepatobiliary: Liver is normal in size and contour. Gallbladder is unremarkable.  Pancreas: Unremarkable  Spleen: Unremarkable  Adrenals/Urinary Tract: The adrenal glands are normal. Kidneys enhance symmetrically with contrast. No hydronephrosis. Urinary bladder is unremarkable.  Stomach/Bowel: Sigmoid colonic diverticulosis. No CT evidence for acute diverticulitis. No abnormal bowel wall thickening or evidence for bowel obstruction. Normal morphology of the stomach.  Lymphatic: No retroperitoneal  lymphadenopathy.  Reproductive: Central dystrophic calcifications in the prostate.  Other: None.  Musculoskeletal: No aggressive or acute appearing osseous lesions. Lumbar spine degenerative changes.  Review of the MIP images confirms the above findings.  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  Ascending thoracic aorta measures 4 cm. Recommend annual imaging followup by CTA or MRA. This recommendation follows 2010 ACCF/AHA/AATS/ACR/ASA/SCA/SCAI/SIR/STS/SVM Guidelines for the Diagnosis and Management of Patients with Thoracic Aortic Disease. Circulation. 2010; 121: V035-K093  No acute process identified within the chest, abdomen or pelvis.  Carotid Doppler. 1-39% ICA plaquing.  Vertebral artery flow is antegrade.   Assessment & Plan by Problem:  Cory Sloan is a 71 y.o. 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  Chest pain.Chest pain that is atypical in that it is sharp and initially started as right sided neck pain radiating into his head, jaw and ear and then radiated into his chest.  It was 10/10 and sharp and worse with deep breathing.  Pain constant for over 9 hours with normal troponin.  He says that pain is different from his typical cardiac pain that he had with his STEMI.  His initial EKG showed nonspecific T wave abnormality in the inferior and anterolateral leads unchanged from prior EKG. No evidence of dissection of the aorta on chest CT and pulses distally are intact. Cardiology is following and they don't think he has ACS at this point. They're planning for a Lexiscan Myoview in the morning if all his troponin remains  negative. -Admit to telemetry. -Trend troponin. -Echo to assess left ventricular function.  History of CAD. 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  Repeat echo 10/2015  with EF 50% and mild inferior hypokinesia.  He was changed form Brilinta to Plavix due to SOB. Current symptoms feel different than his previous chest pain during STEMI. -Continue home dose of Plavix, Coreg, aspirin and Lipitor.  History of laryngeal carcinoma in remission.S/P larynectomy and radiation therapy. Patient has long-term tracheostomy. He used to follow-up with oncology, stating no more further follow-up needed.  History of COPD. No current exacerbation. -Albuterol nebulizer when necessary.  CODE STATUS. Full DVT prophylaxis. Heparin Diet. Heart healthy  Dispo: Admit patient to Observation with expected length of stay less than 2 midnights.  Signed: Lorella Nimrod, MD 03/10/2017, 1:29 PM  Pager: [phone]

## 2017-03-10 NOTE — Progress Notes (Signed)
VASCULAR LAB PRELIMINARY  PRELIMINARY  PRELIMINARY  PRELIMINARY  Carotid duplex completed.    Preliminary report:  1-39% ICA plaquing.  Vertebral artery flow is antegrade.   Hendel Gatliff, RVT 03/10/2017, 4:36 PM

## 2017-03-10 NOTE — Consult Note (Addendum)
Admit date: 03/10/2017 Referring Physician  Dr. Sherry Ruffing Primary Cardiologist  Dr. Shelva Majestic Reason for Consultation  Chest pain  HPI: Cory Sloan is a 71 y.o. male who is being seen today for the evaluation of chest pain at the request of Dr. Sherry Ruffing, MD.  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  Repeat echo 10/2015  with EF 50% and mild inferior HK.  He was changed form Brilinta to Plavix due to SOB.  He also has COPD, hyperlipidemia and throat CA s/p laryngectomy and XRT in 2005.  He was seen recently by Dr. [name] in February of the year and denied any anginal CP but occasionally would have sharp twinges of nonexertional CP.  Today he presented to the ER with severe sharp CP.  He says that in the middle of the night he developed severe sharp pain in his right neck with radiation into his head.  Then the pain radiated down into his chest.  It was worse with inspiration and not like his typical angina. There was no associated nausea or diaphoresis.  He has had some increased DOE recently. He denies any recent long travel or LE edema.   In ER trop negative x 2 despite 9 hours of CP. He currently is pain free after coming back from CT which showed no evidence of aortic dissection and a 4cm ascending thoracic aorta but no dissection of the ascending or descending aorta.  There was a possible short focal segment of dissection within the right common iliac artery but per IM - vascular felt this was no present.     PMH:   Past Medical History:  Diagnosis Date  . COPD (chronic obstructive pulmonary disease) ([place])   . HLD (hyperlipidemia)   . STEMI (ST elevation myocardial infarction) [facility]) November 2016   with PCI to RCA and associated cardiogenic shock  . 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 Tobacco abuse      PSH:   Past Surgical History:  Procedure Laterality Date  . APPENDECTOMY    . CARDIAC CATHETERIZATION N/A 10/04/2015   Procedure: Left Heart Cath and Coronary Angiography;  Surgeon: Troy Sine, MD;  Location: [facility];  Service: Cardiovascular;  Laterality: N/A;  . CARDIAC CATHETERIZATION N/A 10/04/2015   Procedure: Coronary Stent Intervention;  Surgeon: Troy Sine, MD;  Location: [facility];  Service: Cardiovascular;  Laterality: N/A;  . THROAT SURGERY  2005   [place]    Allergies:  Patient has no known allergies. Prior to Admit Meds:   (Not in a hospital admission) Fam HX:    Family History  Problem Relation Age of Onset  . Cancer - Other Mother   . Brain cancer Father   . CAD Brother    Social HX:    Social History   Social History  . Marital status: Married    Spouse name: N/A  . Number of children: N/A  . Years of education: N/A   Occupational History  . retired    Social History Main Topics  . Smoking status: Former Smoker    Packs/day: 1.50    Years: 40.00    Types: Cigarettes    Quit date: 08/20/2003  . Smokeless tobacco: Never Used  .  Alcohol use No  . Drug use: No  . Sexual activity: Not Currently   Other Topics Concern  . Not on file   Social History Narrative  . No narrative on file     ROS:  All  ROS were addressed and are negative except what is stated in the HPI  Physical Exam: Blood pressure 131/87, pulse 78, temperature 97.8 F (36.6 C), temperature source Oral, resp. rate 18, height 5\' 11"  (1.803 m), weight 165 lb (74.8 kg), SpO2 95 %.    General: Well developed, well nourished, in no acute distress Head: Eyes PERRLA, No xanthomas.   Normal cephalic and atramatic.  Trach in place Lungs:   Clear bilaterally to auscultation Heart:   HRRR S1 S2 Pulses are 2+ & equal.            No carotid bruit. No JVD.  No abdominal bruits. No femoral bruits. Abdomen: Bowel sounds are positive, abdomen soft  and non-tender without masses  Msk:  Back normal, normal gait. Normal strength and tone for age. Extremities:   No clubbing, cyanosis or edema.  DP +2 Neuro: Alert and oriented X 3. Psych:  Good affect, responds appropriately    Labs:   Lab Results  Component Value Date   WBC 9.0 03/10/2017   HGB 13.7 03/10/2017   HCT 41.6 03/10/2017   MCV 89.8 03/10/2017   PLT 226 03/10/2017    Recent Labs Lab 03/10/17 0818  NA 138  K 3.9  CL 106  CO2 25  BUN 13  CREATININE 1.11  CALCIUM 8.8*  GLUCOSE 101*   No results found for: PTT Lab Results  Component Value Date   INR 1.02 03/10/2017   INR 1.05 10/06/2015   Lab Results  Component Value Date   CKTOTAL 180 12/30/2009   CKMB 1.9 12/30/2009   TROPONINI <0.03 03/10/2017     Lab Results  Component Value Date   CHOL 116 01/04/2017   CHOL 241 (H) 01/13/2016   CHOL 132 10/24/2015   Lab Results  Component Value Date   HDL 44 01/04/2017   HDL 44 01/13/2016   HDL 38 (L) 10/24/2015   Lab Results  Component Value Date   LDLCALC 55 01/04/2017   LDLCALC 146 (H) 01/13/2016   LDLCALC 65 10/24/2015   Lab Results  Component Value Date   TRIG 86 01/04/2017   TRIG 256 (H) 01/13/2016   TRIG 143 10/24/2015   Lab Results  Component Value Date   CHOLHDL 2.6 01/04/2017   CHOLHDL 5.5 01/13/2016   CHOLHDL 3.5 10/24/2015   No results found for: LDLDIRECT    Radiology:  Dg Chest Portable 1 View  Result Date: 03/10/2017 CLINICAL DATA:  Patient with mid chest pain.  Shortness of breath. EXAM: PORTABLE CHEST 1 VIEW COMPARISON:  Chest radiograph 10/07/2015 FINDINGS: Monitoring leads overlie the patient. Normal cardiac and mediastinal contours. No consolidative pulmonary opacities. No pleural effusion or pneumothorax. IMPRESSION: No acute cardiopulmonary process. Electronically Signed   By: Lovey Newcomer M.D.   On: 03/10/2017 08:37   Ct Angio Chest/abd/pel For Dissection W And/or W/wo  Result Date: 03/10/2017 CLINICAL DATA:  Patient  with chest pain since 4 a.m. Evaluate for dissection. EXAM: CT ANGIOGRAPHY CHEST, ABDOMEN AND PELVIS TECHNIQUE: Multidetector CT imaging through the chest, abdomen and pelvis was performed using the standard protocol during bolus administration of intravenous contrast. Multiplanar reconstructed images and MIPs were obtained and reviewed to evaluate the vascular anatomy. CONTRAST:  100 cc Isovue 370 COMPARISON:  Chest radiograph 03/10/2017; chest CT 10/06/2015 FINDINGS: CTA CHEST FINDINGS Cardiovascular: Normal heart size. No pericardial effusion. Coronary arterial vascular calcifications. Noncontrast images demonstrate no high attenuation within the thoracic aorta to suggest acute intramural hematoma. Post-contrast images demonstrate no thoracic aortic dissection. The ascending thoracic aorta measures 4 cm, unchanged from prior. The takeoff of the great vessels are patent. Few tiny atherosclerotic ulcers within the descending thoracic aorta (image 115; series 7). Mediastinum/Nodes: No enlarged axillary, mediastinal or hilar lymphadenopathy. Normal esophagus. Lungs/Pleura: Postsurgical changes compatible with laryngectomy. Layering debris within the distal trachea. Subpleural ground-glass opacities within the lower lobes bilaterally. Centrilobular and paraseptal emphysematous change. Stable 6 mm fissural nodule within the right mid lung (image 86; series 8), compatible with benign etiology given stability over time. No pleural effusion or pneumothorax. Musculoskeletal: Thoracic spine degenerative changes. No aggressive or acute appearing osseous lesions. Review of the MIP images confirms the above findings. CTA ABDOMEN AND PELVIS FINDINGS VASCULAR Aorta: Abdominal aorta is normal in caliber. Peripheral calcified and noncalcified atherosclerotic plaque. There is a focal short-segment dissection within the right common iliac artery (image 223; series 7). Celiac: Celiac axis is patent.  Branch vessels are patent. SMA:  Superior mesenteric artery is patent. Renals: Two right-sided and two left-sided renal arteries are patent without significant stenosis. IMA: Patent without evidence of aneurysm, dissection, vasculitis or significant stenosis. Veins: No obvious venous abnormality within the limitations of this arterial phase study. Review of the MIP images confirms the above findings. NON-VASCULAR Hepatobiliary: Liver is normal in size and contour. Gallbladder is unremarkable. Pancreas: Unremarkable Spleen: Unremarkable Adrenals/Urinary Tract: The adrenal glands are normal. Kidneys enhance symmetrically with contrast. No hydronephrosis. Urinary bladder is unremarkable. Stomach/Bowel: Sigmoid colonic diverticulosis. No CT evidence for acute diverticulitis. No abnormal bowel wall thickening or evidence for bowel obstruction. Normal morphology of the stomach. Lymphatic: No retroperitoneal lymphadenopathy. Reproductive: Central dystrophic calcifications in the prostate. Other: None. Musculoskeletal: No aggressive or acute appearing osseous lesions. Lumbar spine degenerative changes. Review of the MIP images confirms the above findings. 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 Ascending thoracic aorta measures 4 cm. Recommend annual imaging followup by CTA or MRA. This recommendation follows 2010 ACCF/AHA/AATS/ACR/ASA/SCA/SCAI/SIR/STS/SVM Guidelines for the Diagnosis and Management of Patients with Thoracic Aortic Disease. Circulation. 2010; 121: [id number] No acute process identified within the chest, abdomen or pelvis. Electronically Signed   By: Lovey Newcomer M.D.   On: 03/10/2017 10:33     Telemetry    NSR - Personally Reviewed  ECG    #1 NSR with IRBBB and wandering baseline artifact and nonspecific T wave abnormality in the anterolateral and inferior leads #2 NSR with T wave abnormality more prominent in anterior leads than prior EKG #3 NSR with  nonspecific T  wave abnormality - Personally Reviewed   ASSESSMENT/PLAN:   1.  Chest pain that is atypical in that it is sharp and initially started as right sided neck pain radiating into his head, jaw and ear and then radiated into his chest.  It was 10/10 and sharp and worse with deep breathing.  Pain constant for over 9 hours with normal troponin x 2.  He says that pain is different from his typical cardiac pain that he had with his STEMI.  His initial EKG showed nonspecific T wave abnormality in the inferior and anterolateral leads unchanged from prior EKG.  Second EKG showed slightly more prominent T wave abnormality in the anterior leads and 3rd EKG improved.  He currently is pain free.  He did have some CP a few days ago that was his typical angina which he has not had in a long time.  No evidence of dissection of the aorta on chest CT and pulses distally are intact.  I do not think this is an ACE.   - he will be admitted by IM - cycle trop - continue ASA, statin, Plavix and BB - NPO after MN - If enzymes remain normal then lexiscan myoview in am.  - check 2D echo to assess LVF - given initial onset of severe sharp neck, ear, jaw and head pain, will get carotid dopplers  2.  ASCAD s/p remote inferior STEMI with PCI of the RCA.  Minimal disease in the LAD.   - continue on DAPT, statin and BB.  3.  Hyperlipidemia - continue statin - LDL at goal at 55 in February 2018  4.  SCCA of the throat s/p laryngectomy and XRT  Fransico Him, MD  03/10/2017  2:19 PM

## 2017-03-10 NOTE — ED Notes (Addendum)
Pt requesting something to eat, MD advises not until admitting sees pt. Pt made aware and agrees.

## 2017-03-10 NOTE — ED Triage Notes (Signed)
Pt reports onset this am 0300 of mid chest pain and sob. Pain radiates into his neck.

## 2017-03-10 NOTE — ED Notes (Signed)
Patient transported to CT 

## 2017-03-10 NOTE — ED Provider Notes (Signed)
[facility] DEPT Provider Note   CSN: [id number] Arrival date & time: 03/10/17  7673     History   Chief Complaint Chief Complaint  Patient presents with  . Chest Pain  . Shortness of Breath    HPI Cory Sloan is a 71 y.o. male With a past medical history of COPD, hyperlipidemia, history of STEMI with near total occlusion of the RCA and cardiogenic shock. 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 He has known coronary artery disease and is followed by Dr. [name]. The patient presents with severe chest pain and shortness of breath. He states feeling like his previous STEMI. The patient awoke at 3 AM with severe retrosternal chest pain, shortness of breath, severe pressure. The patient arrived in the hallway clutching his left chest. . He denies any wheezing, cough. He says this does not feel like COPD. He has not had any fevers, chills .HPI  Past Medical History:  Diagnosis Date  . COPD (chronic obstructive pulmonary disease) ([place])   . HLD (hyperlipidemia)   . STEMI (ST elevation myocardial infarction) [facility]) November 2016   with PCI to RCA and associated cardiogenic shock  . 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 Tobacco abuse     Patient Active Problem List   Diagnosis Date Noted  . CAD in native artery 06/26/2016  . Hyperlipidemia LDL goal <70 03/11/2016  . CAP (community acquired pneumonia) 10/14/2015  . Hemoptysis   . Chronic obstructive pulmonary disease ([place])   . Laryngeal cancer ([place]) in remission   . ST elevation (STEMI) myocardial infarction involving right coronary artery ([place]) 10/04/2015  . ST elevation myocardial infarction involving right coronary artery [facility])     Past Surgical History:  Procedure Laterality Date  . APPENDECTOMY    . CARDIAC CATHETERIZATION N/A 10/04/2015   Procedure: Left Heart Cath and Coronary Angiography;  Surgeon: Troy Sine, MD;  Location: [facility] CV LAB;  Service: Cardiovascular;  Laterality: N/A;  . CARDIAC CATHETERIZATION N/A 10/04/2015   Procedure: Coronary Stent Intervention;  Surgeon: Troy Sine, MD;  Location: [facility] CV LAB;  Service: Cardiovascular;  Laterality: N/A;  . THROAT SURGERY  2005   [place]       Home Medications    Prior to Admission medications   Medication Sig Start Date End Date Taking? Authorizing Provider  albuterol (PROVENTIL HFA;VENTOLIN HFA) 108 (90 BASE) MCG/ACT inhaler Inhale 1-2 puffs into the lungs every 4 (four) hours as needed for wheezing or shortness of breath (or coughing). 09/29/15   Merrily Pew, MD  aspirin EC 81 MG EC tablet Take 1 tablet (81 mg total) by mouth daily. 10/07/15   Bhavinkumar Bhagat, PA  atorvastatin (LIPITOR) 80 MG tablet TAKE 1 TABLET (80 MG TOTAL) BY MOUTH DAILY AT 6 PM. 01/08/17   Troy Sine, MD  benzonatate (TESSALON) 200 MG capsule TAKE 1 CAPSULE (200 MG TOTAL) BY MOUTH 3 (THREE) TIMES DAILY AS NEEDED FOR COUGH. 01/20/16   Deneise Lever, MD  carvedilol (COREG) 3.125 MG tablet Take 1 .5 tablets twice a day 03/09/16   Troy Sine, MD  clopidogrel (PLAVIX) 75 MG tablet TAKE 1 TABLET (75 MG TOTAL) BY MOUTH DAILY. 07/02/16   Troy Sine, MD  nitroGLYCERIN (NITROSTAT) 0.4 MG SL tablet Place 1 tablet (0.4 mg total) under the tongue every 5 (five) minutes as needed  for chest pain (CP or SOB). 10/07/15   Leanor Kail, PA    Family History Family History  Problem Relation Age of Onset  . Cancer - Other Mother   . Brain cancer Father   . CAD Brother     Social History Social History  Substance Use Topics  . Smoking status: Former Smoker    Packs/day: 1.50    Years: 40.00    Types: Cigarettes    Quit date: 08/20/2003  . Smokeless tobacco: Never Used  . Alcohol use No     Allergies   Patient has no known allergies.   Review of Systems Review of Systems  Ten systems reviewed and are negative for acute change,  except as noted in the HPI.   Physical Exam Updated Vital Signs BP 97/74 (BP Location: Right Arm)   Pulse 72   Temp 97.8 F (36.6 C) (Oral)   Resp (!) 22   Ht 5\' 11"  (1.803 m)   Wt 74.8 kg   SpO2 96%   BMI 23.01 kg/m   Physical Exam  Constitutional: He appears well-developed and well-nourished. No distress.  HENT:  Head: Normocephalic and atraumatic.  Eyes: Conjunctivae and EOM are normal. Pupils are equal, round, and reactive to light. No scleral icterus.  Neck: Normal range of motion. Neck supple.  Cardiovascular: Normal rate, regular rhythm and normal heart sounds.   Pulmonary/Chest: Effort normal and breath sounds normal. No respiratory distress.  Prolonged effort likely secondary to his surgical changes in the throat. No wheezes , noisy breathing  Abdominal: Soft. There is no tenderness.  Musculoskeletal: He exhibits no edema.  Neurological: He is alert.  Skin: Skin is warm and dry. He is not diaphoretic.  Psychiatric: His behavior is normal.  Nursing note and vitals reviewed.    ED Treatments / Results  Labs (all labs ordered are listed, but only abnormal results are displayed) Labs Reviewed  BASIC METABOLIC PANEL  CBC  PROTIME-INR  APTT  I-STAT [place], ED    EKG  EKG Interpretation  Date/Time:  Sunday March 10 2017 08:04:47 EDT Ventricular Rate:  67 PR Interval:    QRS Duration: 87 QT Interval:  409 QTC Calculation: 429 R Axis:   -76 Text Interpretation:  Sinus rhythm RSR' in V1 or V2, right VCD or RVH Inferior infarct, age indeterminate No STEMI Confirmed by TEGELER MD, CHRISTOPHER [phone]) on 03/10/2017 8:09:18 AM       Radiology No results found.  Procedures Procedures (including critical care time)  Medications Ordered in ED Medications  nitroGLYCERIN (NITROGLYN) 2 % ointment 1 inch (not administered)  aspirin chewable tablet 81 mg (not administered)     Initial Impression / Assessment and Plan / ED Course  I have reviewed the  triage vital signs and the nursing notes.  Pertinent labs & imaging results that were available during my care of the patient were reviewed by me and considered in my medical decision making (see chart for details).  Clinical Course as of Mar 10 1641  Sun Mar 10, 2017  0845 Patient noted to by hypotensive.  I doubt PE as the patient is on plavix and asa.  I am concerned for cardiogenic shock given . His presentation, previous history of low blood pressure. I have ordered a lactic acid. Patient is getting fluids. We will continue to monitor closely. His labs are still pending. EKG without signs of a STEMI  Or acute ischemia at this point. BP: (!) 87/62 [AH]  [id number] . Patient  with out elevated lactic acid Lactic Acid, Venous: 1.51 [AH]  0950 Patient without + trop. Repeat EKG shows worsening T-wave inversions in the lateral leads.  I spoke with Dr. Haroldine Laws  who asks that we scan for dissection. I will continue to bolster with fluids.  [AH]  1114  PATIENT WITHOUT EVIDENCE OF DISSECTION IN THE THORACIC OR ABDOMINAL AORTA. There is an incidental finding of a right common iliac artery dissection, which is short segment and the patient does not have any decreased pulse, pain or paresthesia in his right leg. I will consult with vascular surgery. CT Angio Chest/Abd/Pel for Dissection W and/or W/WO [AH]  1118 I spoke with Dr. Donzetta Matters of vascular surgery. He  And I reviewed the images, he feels that there is a defect, but does not feel that the imaging represents dissection in the right common iliac. Patient's blood pressures have improved. I have reevaluated the patient. He states he did do some heavy lifting yesterday. Marland Kitchen He continues to have chest pain. He does state it is worse with breathing, but denies any unilateral leg swelling or history of DVT. The patient also has equal pulses bilaterally and no calf swelling on examination.  [AH]    Clinical Course User Index [AH] Margarita Mail, PA-C     Patient  will be admitted by the hospitalist service for troponin trending's chest pain rule out.  Cardiology  Will consult on the patient.  Patient   He has remained stable throughout his visit.  Final Clinical Impressions(s) / ED Diagnoses   Final diagnoses:  Chest pain, unspecified type    New Prescriptions New Prescriptions   No medications on file     Margarita Mail, PA-C 03/10/17 [facility], MD 03/13/17 1039

## 2017-03-10 NOTE — ED Notes (Signed)
Ambulated pt to restroom with no difficulties.

## 2017-03-10 NOTE — ED Notes (Signed)
Pt reports a sharpe brief pain when he coughed. Pt states he is pain free now.

## 2017-03-10 NOTE — ED Notes (Signed)
Hospital MD at bedside speaking with pt about plan of care. Waiting for admitting.

## 2017-03-11 ENCOUNTER — Observation Stay (HOSPITAL_BASED_OUTPATIENT_CLINIC_OR_DEPARTMENT_OTHER): Payer: Medicare HMO

## 2017-03-11 ENCOUNTER — Other Ambulatory Visit: Payer: Self-pay | Admitting: Physician Assistant

## 2017-03-11 DIAGNOSIS — I712 Thoracic aortic aneurysm, without rupture, unspecified: Secondary | ICD-10-CM | POA: Diagnosis present

## 2017-03-11 DIAGNOSIS — Z955 Presence of coronary angioplasty implant and graft: Secondary | ICD-10-CM | POA: Diagnosis not present

## 2017-03-11 DIAGNOSIS — Z9002 Acquired absence of larynx: Secondary | ICD-10-CM

## 2017-03-11 DIAGNOSIS — Z8521 Personal history of malignant neoplasm of larynx: Secondary | ICD-10-CM | POA: Diagnosis not present

## 2017-03-11 DIAGNOSIS — I700 Atherosclerosis of aorta: Secondary | ICD-10-CM | POA: Diagnosis not present

## 2017-03-11 DIAGNOSIS — J449 Chronic obstructive pulmonary disease, unspecified: Secondary | ICD-10-CM | POA: Diagnosis not present

## 2017-03-11 DIAGNOSIS — R072 Precordial pain: Secondary | ICD-10-CM

## 2017-03-11 DIAGNOSIS — Z7902 Long term (current) use of antithrombotics/antiplatelets: Secondary | ICD-10-CM | POA: Diagnosis not present

## 2017-03-11 DIAGNOSIS — Z7982 Long term (current) use of aspirin: Secondary | ICD-10-CM

## 2017-03-11 DIAGNOSIS — I252 Old myocardial infarction: Secondary | ICD-10-CM

## 2017-03-11 DIAGNOSIS — Z923 Personal history of irradiation: Secondary | ICD-10-CM | POA: Diagnosis not present

## 2017-03-11 DIAGNOSIS — I10 Essential (primary) hypertension: Secondary | ICD-10-CM

## 2017-03-11 DIAGNOSIS — R0789 Other chest pain: Secondary | ICD-10-CM

## 2017-03-11 DIAGNOSIS — Z9861 Coronary angioplasty status: Secondary | ICD-10-CM | POA: Diagnosis not present

## 2017-03-11 DIAGNOSIS — Z809 Family history of malignant neoplasm, unspecified: Secondary | ICD-10-CM

## 2017-03-11 DIAGNOSIS — Z930 Tracheostomy status: Secondary | ICD-10-CM

## 2017-03-11 DIAGNOSIS — I2510 Atherosclerotic heart disease of native coronary artery without angina pectoris: Secondary | ICD-10-CM | POA: Diagnosis not present

## 2017-03-11 DIAGNOSIS — R071 Chest pain on breathing: Secondary | ICD-10-CM | POA: Diagnosis not present

## 2017-03-11 DIAGNOSIS — Z87891 Personal history of nicotine dependence: Secondary | ICD-10-CM

## 2017-03-11 DIAGNOSIS — E782 Mixed hyperlipidemia: Secondary | ICD-10-CM | POA: Diagnosis not present

## 2017-03-11 DIAGNOSIS — Z8249 Family history of ischemic heart disease and other diseases of the circulatory system: Secondary | ICD-10-CM

## 2017-03-11 LAB — VAS US CAROTID
LEFT ECA DIAS: -15 cm/s
LEFT VERTEBRAL DIAS: -23 cm/s
Left ICA dist dias: -27 cm/s
Left ICA dist sys: -57 cm/s
Left ICA prox dias: -22 cm/s
Left ICA prox sys: -59 cm/s
RIGHT ECA DIAS: -16 cm/s
RIGHT VERTEBRAL DIAS: -21 cm/s
Right cca dist sys: -96 cm/s

## 2017-03-11 LAB — BASIC METABOLIC PANEL
Anion gap: 5 (ref 5–15)
BUN: 10 mg/dL (ref 6–20)
CALCIUM: 8.3 mg/dL — AB (ref 8.9–10.3)
CO2: 25 mmol/L (ref 22–32)
CREATININE: 0.95 mg/dL (ref 0.61–1.24)
Chloride: 109 mmol/L (ref 101–111)
GFR calc non Af Amer: >60 mL/min (ref >60)
Glucose, Bld: 105 mg/dL — ABNORMAL HIGH (ref 65–99)
Potassium: 3.9 mmol/L (ref 3.5–5.1)
Sodium: 139 mmol/L (ref 135–145)

## 2017-03-11 LAB — CBC
HCT: 38.4 % — ABNORMAL LOW (ref 39.0–52.0)
Hemoglobin: 12.5 g/dL — ABNORMAL LOW (ref 13.0–17.0)
MCH: 29.5 pg (ref 26.0–34.0)
MCHC: 32.6 g/dL (ref 30.0–36.0)
MCV: 90.6 fL (ref 78.0–100.0)
Platelets: 189 10*3/uL (ref 150–400)
RBC: 4.24 MIL/uL (ref 4.22–5.81)
RDW: 13.1 % (ref 11.5–15.5)
WBC: 7 10*3/uL (ref 4.0–10.5)

## 2017-03-11 LAB — ECHOCARDIOGRAM COMPLETE
Height: 70 in
Weight: 2638.4 oz

## 2017-03-11 LAB — TROPONIN I

## 2017-03-11 MED ORDER — CARVEDILOL 3.125 MG PO TABS: 3.1250 mg | ORAL_TABLET | Freq: Two times a day (BID) | ORAL | 0 refills | Status: DC

## 2017-03-11 NOTE — Progress Notes (Signed)
Progress Note  Patient Name: Cory Sloan Date of Encounter: 03/11/2017  Primary Cardiologist: Dr. [name]  Subjective   Feeling well. No chest pain, sob or palpitations.   Inpatient Medications    Scheduled Meds: . aspirin EC  81 mg Oral Daily  . atorvastatin  80 mg Oral q1800  . carvedilol  3.125 mg Oral BID WC  . clopidogrel  75 mg Oral Daily  . heparin  5,000 Units Subcutaneous Q8H  . sodium chloride flush  3 mL Intravenous Q12H   Continuous Infusions:  PRN Meds: acetaminophen **OR** acetaminophen, albuterol, nitroGLYCERIN, promethazine, senna-docusate   Vital Signs    Vitals:   03/10/17 1430 03/10/17 1517 03/10/17 2205 03/11/17 0354  BP: 124/87 (!) 161/94 121/78 103/67  Pulse: 66 94 85 69  Resp:  16 18   Temp:  97.9 F (36.6 C) 99 F (37.2 C) 97.8 F (36.6 C)  TempSrc:  Oral Oral Oral  SpO2: 97% 98% 98% 95%  Weight:  171 lb (77.6 kg)  164 lb 14.4 oz (74.8 kg)  Height:  5\' 10"  (1.778 m)      Intake/Output Summary (Last 24 hours) at 03/11/17 0758 Last data filed at 03/11/17 0356  Gross per 24 hour  Intake             3243 ml  Output              400 ml  Net             2843 ml   Filed Weights   03/10/17 0810 03/10/17 1517 03/11/17 0354  Weight: 165 lb (74.8 kg) 171 lb (77.6 kg) 164 lb 14.4 oz (74.8 kg)    Telemetry    NSR at rate of 90s - Personally Reviewed  ECG    None today  Physical Exam   GEN: No acute distress.   Neck: No JVD. Trach in place Cardiac: RRR, no murmurs, rubs, or gallops.  Respiratory: Clear to auscultation bilaterally. GI: Soft, nontender, non-distended  MS: No edema; No deformity. Neuro:  Nonfocal  Psych: Normal affect   Labs    Chemistry Recent Labs Lab 03/10/17 0818 03/11/17 0332  NA 138 139  K 3.9 3.9  CL 106 109  CO2 25 25  GLUCOSE 101* 105*  BUN 13 10  CREATININE 1.11 0.95  CALCIUM 8.8* 8.3*  GFRNONAA >60 >60  GFRAA >60 >60  ANIONGAP 7 5     Hematology Recent Labs Lab 03/10/17 0818  03/11/17 0332  WBC 9.0 7.0  RBC 4.63 4.24  HGB 13.7 12.5*  HCT 41.6 38.4*  MCV 89.8 90.6  MCH 29.6 29.5  MCHC 32.9 32.6  RDW 13.0 13.1  PLT 226 189    Cardiac Enzymes Recent Labs Lab 03/10/17 1123 03/10/17 1559 03/10/17 2120 03/11/17 0332  TROPONINI <0.03 <0.03 <0.03 <0.03    Recent Labs Lab 03/10/17 0829  TROPIPOC 0.00     BNPNo results for input(s): BNP, PROBNP in the last 168 hours.   DDimer No results for input(s): DDIMER in the last 168 hours.   Radiology    Dg Chest Portable 1 View  Result Date: 03/10/2017 CLINICAL DATA:  Patient with mid chest pain.  Shortness of breath. EXAM: PORTABLE CHEST 1 VIEW COMPARISON:  Chest radiograph 10/07/2015 FINDINGS: Monitoring leads overlie the patient. Normal cardiac and mediastinal contours. No consolidative pulmonary opacities. No pleural effusion or pneumothorax. IMPRESSION: No acute cardiopulmonary process. Electronically Signed   By: Polly Cobia.D.  On: 03/10/2017 08:37   Ct Angio Chest/abd/pel For Dissection W And/or W/wo  Result Date: 03/10/2017 CLINICAL DATA:  Patient with chest pain since 4 a.m. Evaluate for dissection. EXAM: CT ANGIOGRAPHY CHEST, ABDOMEN AND PELVIS TECHNIQUE: Multidetector CT imaging through the chest, abdomen and pelvis was performed using the standard protocol during bolus administration of intravenous contrast. Multiplanar reconstructed images and MIPs were obtained and reviewed to evaluate the vascular anatomy. CONTRAST:  100 cc Isovue 370 COMPARISON:  Chest radiograph 03/10/2017; chest CT 10/06/2015 FINDINGS: CTA CHEST FINDINGS Cardiovascular: Normal heart size. No pericardial effusion. Coronary arterial vascular calcifications. Noncontrast images demonstrate no high attenuation within the thoracic aorta to suggest acute intramural hematoma. Post-contrast images demonstrate no thoracic aortic dissection. The ascending thoracic aorta measures 4 cm, unchanged from prior. The takeoff of the great vessels  are patent. Few tiny atherosclerotic ulcers within the descending thoracic aorta (image 115; series 7). Mediastinum/Nodes: No enlarged axillary, mediastinal or hilar lymphadenopathy. Normal esophagus. Lungs/Pleura: Postsurgical changes compatible with laryngectomy. Layering debris within the distal trachea. Subpleural ground-glass opacities within the lower lobes bilaterally. Centrilobular and paraseptal emphysematous change. Stable 6 mm fissural nodule within the right mid lung (image 86; series 8), compatible with benign etiology given stability over time. No pleural effusion or pneumothorax. Musculoskeletal: Thoracic spine degenerative changes. No aggressive or acute appearing osseous lesions. Review of the MIP images confirms the above findings. CTA ABDOMEN AND PELVIS FINDINGS VASCULAR Aorta: Abdominal aorta is normal in caliber. Peripheral calcified and noncalcified atherosclerotic plaque. There is a focal short-segment dissection within the right common iliac artery (image 223; series 7). Celiac: Celiac axis is patent.  Branch vessels are patent. SMA: Superior mesenteric artery is patent. Renals: Two right-sided and two left-sided renal arteries are patent without significant stenosis. IMA: Patent without evidence of aneurysm, dissection, vasculitis or significant stenosis. Veins: No obvious venous abnormality within the limitations of this arterial phase study. Review of the MIP images confirms the above findings. NON-VASCULAR Hepatobiliary: Liver is normal in size and contour. Gallbladder is unremarkable. Pancreas: Unremarkable Spleen: Unremarkable Adrenals/Urinary Tract: The adrenal glands are normal. Kidneys enhance symmetrically with contrast. No hydronephrosis. Urinary bladder is unremarkable. Stomach/Bowel: Sigmoid colonic diverticulosis. No CT evidence for acute diverticulitis. No abnormal bowel wall thickening or evidence for bowel obstruction. Normal morphology of the stomach. Lymphatic: No  retroperitoneal lymphadenopathy. Reproductive: Central dystrophic calcifications in the prostate. Other: None. Musculoskeletal: No aggressive or acute appearing osseous lesions. Lumbar spine degenerative changes. Review of the MIP images confirms the above findings. 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 Ascending thoracic aorta measures 4 cm. Recommend annual imaging followup by CTA or MRA. This recommendation follows 2010 ACCF/AHA/AATS/ACR/ASA/SCA/SCAI/SIR/STS/SVM Guidelines for the Diagnosis and Management of Patients with Thoracic Aortic Disease. Circulation. 2010; 121: W967-R916 No acute process identified within the chest, abdomen or pelvis. Electronically Signed   By: Lovey Newcomer M.D.   On: 03/10/2017 10:33    Cardiac Studies   Pending echo today   Patient Profile     71 y.o. 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   CT angio of chest showed  no evidence of aortic dissection and a 4cm ascending thoracic aorta but no dissection of the ascending or descending aorta.  There was a possible short focal segment of dissection within the right  common iliac artery but per IM - vascular felt this was no present.  Assessment & Plan    1. Chest pain  - Atypical. Troponin x 4 negative. His initial EKG showed nonspecific T wave abnormality in the inferior and anterolateral leads unchanged from prior EKG.  Second EKG showed slightly more prominent T wave abnormality in the anterior leads and 3rd EKG improved.   No evidence of dissection of the aorta on chest CT. This pain is different from his typical cardiac pain when he had STEMI in 2016. - preliminary result of carotid doppler showed 1-39% ICA plaquing. - He took 2 SL nitro yesterday prior to presentation without  improvement, stats that he might had pulled muscle. Keep NPO. MD to decided need of stress test. Pending echo.   2. CAD s/p DES to RCA in 09/2015 - Continue ASA, plavix, statin and BB  3. HLD - 01/04/2017: Cholesterol 116; HDL 44; LDL Cholesterol 55; Triglycerides 86; VLDL 17  - Continue statin   Signed, [place], PA  03/11/2017, 7:58 AM     The patient was seen, examined and discussed with Bhagat,Bhavinkumar PA-C and I agree with the above.   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 During Saturday he was doing strenuous work mowing his lawn and cleaning his yard. He hasn't had any recurrence he of this chest pain. His EKG is unchanged, troponin is negative 4, he is currently asymptomatic and states that he has been feeling well he's fully active and denies any exertional chest pain or dyspnea. Patient's blood pressure is well controlled. We will continue the same medical regimen with aspirin, Plavix, carvedilol and atorvastatin. He will schedule follow-up appointment within next 2 weeks. The patient can be discharged today.  Ena Dawley 03/11/2017

## 2017-03-11 NOTE — Progress Notes (Signed)
   Subjective: Patient was feeling better when seen this morning. Denies any more episode of chest pain. He was waiting for his testing so he can eat and go home. According to patient and his wife that he was doing some heavy lifting during exercising and that might have caused some muscle strain.  Objective:  Vital signs in last 24 hours: Vitals:   03/10/17 1430 03/10/17 1517 03/10/17 2205 03/11/17 0354  BP: 124/87 (!) 161/94 121/78 103/67  Pulse: 66 94 85 69  Resp:  16 18   Temp:  97.9 F (36.6 C) 99 F (37.2 C) 97.8 F (36.6 C)  TempSrc:  Oral Oral Oral  SpO2: 97% 98% 98% 95%  Weight:  171 lb (77.6 kg)  164 lb 14.4 oz (74.8 kg)  Height:  5\' 10"  (1.778 m)     Gen. Well-developed, well-nourished man, open tracheostomy covered with collar, in no acute distress. Lungs. Clear bilaterally CV. Regular rate and rhythm. Abdominal. Soft, nontender, bowel sounds positive. Extremities. No edema, no cyanosis, pulses 2+ bilaterally.  Labs. CBC Latest Ref Rng & Units 03/11/2017 03/10/2017 01/04/2017  WBC 4.0 - 10.5 K/uL 7.0 9.0 5.2  Hemoglobin 13.0 - 17.0 g/dL 12.5(L) 13.7 14.1  Hematocrit 39.0 - 52.0 % 38.4(L) 41.6 42.3  Platelets 150 - 400 K/uL 189 226 232   BMP Latest Ref Rng & Units 03/11/2017 03/10/2017 01/04/2017  Glucose 65 - 99 mg/dL 105(H) 101(H) 85  BUN 6 - 20 mg/dL 10 13 12   Creatinine 0.61 - 1.24 mg/dL 0.95 1.11 1.04  Sodium 135 - 145 mmol/L 139 138 140  Potassium 3.5 - 5.1 mmol/L 3.9 3.9 4.5  Chloride 101 - 111 mmol/L 109 106 108  CO2 22 - 32 mmol/L 25 25 26   Calcium 8.9 - 10.3 mg/dL 8.3(L) 8.8(L) 8.9   Troponin. <0.03 X 3.  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  Atypical chest pain. Most likely due to musculoskeletal strain in the setting of doing some heavy lifting recently. Cardiac workup so far is negative. Cardiology do not want to do any more workup including stress testing today. He was cleared to be discharged by cardiology. -Echo results still pending.  History of CAD. His workup is negative for any ACS. We continued his home dose of Plavix, Coreg, aspirin and Lipitor. -He was advised to follow-up with his cardiologist as an outpatient.  History of laryngeal carcinoma in remission. No current complaints.  History of COPD. No current exacerbation. -Albuterol nebulizer when necessary.  Dispo: Being discharged today.   Lorella Nimrod, MD 03/11/2017, 10:31 AM Pager: [phone]

## 2017-03-11 NOTE — H&P (Signed)
Internal Medicine Attending Admission Note  I saw and evaluated the patient. I reviewed the resident's note and I agree with the resident's findings and plan as documented in the resident's note.  Assessment & Plan by Problem:   Chest pain, musculoskeletal - Trop negative, EKG unchanged and chest pain is atypical.  Given his recent exertional activity and MSK tenderness on exam I suspect this is MSK pain in origin.  He may be discharged today as no further workup is planned by cardiology.    Laryngeal cancer ([place]) in remission -s/p tracheostomy    CAD in native artery without angina - Cardiology has also evaluated, does not feel that chest pain is anginal.  Recommend follow up in 2 weeks and to continue home medications    Abdominal aortic atherosclerosis [facility]), Thoracic aortic aneurysm without rupture ([place]) - He had a CTA in the ED to rule out dissection.  He was found to have aortic atherosclerosis and a mild thoracic aortic aneurysm with a measured ascending aorta of 4cm.  He will need repeat imaging to assess for changes in size in 1 year. -Additionally there was some concern about a small dissection of the right common iliac artery however the ED contacted vasc surgery and Dr Donzetta Matters reviewed the images and felt this was a defect but does not feel that it represents a dissection of the right common iliac.  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 Pain started yesterday was rated as a 10 out of 10 was associated with some shortness of breath and worse with deep inspiration. Pain was not similar to previous heart attack. An  pain lasted for at least 8-10 hours without change. Today he actually reports to me that the day prior to admission he was physically exert himself with other people and using chains to pull a stump out of the ground he thinks today that he "overdid it" and that might be contributing to his chest pain today.  In the ED there is some concern for aortic dissection and a CTA was obtained. There was some evidence for possible resection of his right common iliac however this would not explain his symptoms, as per surgery was consulted in the ED reviewed the  CT scan and did not feel that this was an actual dissection. Given his coronary disease history, he was admitted overnight to rule out ACS .  Lab results: Reviewed in Epic EKG: personally reviewed, NSR, left anterior fascicular block that is unchanged from previous EKG  Physical Exam - key components related to admission: General: resting in bed, no complaints HEENT: EOMI, no scleral icterus, s/p tracheostomy, tenderness of bilateral SCM musculature Cardiac: RRR, no rubs, murmurs or gallops Chest: tenderness along sternum bilaterally Pulm: clear to auscultation bilaterally, moving normal volumes of air Abd: soft, nontender, nondistended, BS present Ext: warm and well perfused, no pedal edema  Vitals:   03/10/17 1430 03/10/17 1517 03/10/17 2205 03/11/17 0354  BP: 124/87 (!) 161/94 121/78 103/67  Pulse: 66 94 85 69  Resp:  16 18   Temp:  97.9 F (36.6 C) 99 F (37.2 C) 97.8 F (36.6 C)  TempSrc:  Oral Oral Oral  SpO2: 97% 98% 98% 95%  Weight:  171 lb (77.6 kg)  164 lb 14.4 oz (74.8 kg)  Height:  5\' 10"  (1.778 m)

## 2017-03-11 NOTE — Discharge Summary (Signed)
Name: Cory Sloan MRN: [id number] DOB: 1946/10/29 71 y.o. PCP: [facility]  Date of Admission: 03/10/2017  7:59 AM Date of Discharge: 03/11/2017 Attending Physician: No att. providers found  Discharge Diagnosis: 1. Atypical chest pain..  Principal Problem:   Chest pain, musculoskeletal Active Problems:   Laryngeal cancer (HCC) in remission   CAD in native artery   Abdominal aortic atherosclerosis (HCC)   Thoracic aortic aneurysm without rupture [facility])   Discharge Medications: Allergies as of 03/11/2017   No Known Allergies     Medication List    TAKE these medications   albuterol 108 (90 Base) MCG/ACT inhaler Commonly known as:  PROVENTIL HFA;VENTOLIN HFA Inhale 1-2 puffs into the lungs every 4 (four) hours as needed for wheezing or shortness of breath (or coughing).   aspirin 81 MG EC tablet Take 1 tablet (81 mg total) by mouth daily.   atorvastatin 80 MG tablet Commonly known as:  LIPITOR TAKE 1 TABLET (80 MG TOTAL) BY MOUTH DAILY AT 6 PM.   benzonatate 200 MG capsule Commonly known as:  TESSALON TAKE 1 CAPSULE (200 MG TOTAL) BY MOUTH 3 (THREE) TIMES DAILY AS NEEDED FOR COUGH.   carvedilol 3.125 MG tablet Commonly known as:  COREG Take 1 tablet (3.125 mg total) by mouth 2 (two) times daily with a meal. What changed:  how much to take  how to take this  when to take this  additional instructions   clopidogrel 75 MG tablet Commonly known as:  PLAVIX TAKE 1 TABLET (75 MG TOTAL) BY MOUTH DAILY.   nitroGLYCERIN 0.4 MG SL tablet Commonly known as:  NITROSTAT Place 1 tablet (0.4 mg total) under the tongue every 5 (five) minutes as needed for chest pain (CP or SOB).       Disposition and follow-up:   Mr.Cory Sloan was discharged from [facility] in Good condition.  At the hospital follow up visit please address:  1.  Any recurrence of his chest pain.  2.  Labs / imaging needed at time of  follow-up: None  3.  Pending labs/ test needing follow-up: None  Follow-up Appointments: Follow-up Information    Shelva Majestic, MD. Go on 04/04/2017.   Specialty:  Cardiology Why:  @9 :40 am for post hospital follow up  Contact information: [address] [place] [address] [place]. Go on 03/20/2017.   Specialty:  Cardiology Why:  @7 :45 for stress test Contact information: [address] 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  Principal Problem:   Chest pain, musculoskeletal.Most likely due to musculoskeletal strain in the setting of doing some heavy lifting recently. Cardiac workup was negative. Cardiology do not want to do any more workup.  He was cleared to be discharged by cardiology. And he is being discharged and will follow up with cardiology in 2 weeks.  Active Problems:   Laryngeal cancer (HCC) in remission.S/P larynectomy and radiation therapy. Patient has long-term tracheostomy. He used to follow-up with oncology, stating no more  further follow-up needed.    H/O CAD in native artery.Patient has a history of ASCAD with inferior STEMI 09/2015 with cath showing occluded prox to mid RCA s/p PCI and 15% mid LAD and complicated by cardiogenic shock with hypotension. Repeat echo 10/2015 with EF 50% and mild inferior hypokinesia. He was changed form Brilinta to Plavix due to SOB. Current symptoms feel different than his previous chest pain during STEMI. Repeat echo shows ejection fraction of 55-60% with grade 1  diastolic dysfunction, and basal inferolateral hypokinesia consistent with ischemia in the distribution of right coronary artery.  History of COPD. No current exacerbation.    Abdominal aortic atherosclerosis ([place])   Thoracic aortic aneurysm without rupture ([place]). He had a CTA in the ED to rule out dissection.  He was found to have aortic atherosclerosis and a mild thoracic aortic aneurysm with a measured ascending aorta of 4cm.  He will need repeat imaging to assess for changes in size in 1 year. -Additionally there was some concern about a small dissection of the right common iliac artery however the ED contacted vasc surgery and Dr Donzetta Matters reviewed the images and felt this was a defect but does not feel that it represents a dissection of the right common iliac.   Discharge Vitals:   BP 103/67 (BP Location: Left Arm)   Pulse 69   Temp 97.8 F (36.6 C) (Oral)   Resp 18   Ht 5\' 10"  (1.778 m)   Wt 164 lb 14.4 oz (74.8 kg)   SpO2 95%   BMI 23.66 kg/m   Gen. Well-developed, well-nourished man, open tracheostomy covered with collar, in no acute distress. Lungs. Clear bilaterally CV. Regular rate and rhythm. Abdominal. Soft, nontender, bowel sounds positive. Extremities. No edema, no cyanosis, pulses 2+ bilaterally.  Pertinent Labs, Studies, and Procedures:  CBC Latest Ref Rng & Units 03/11/2017 03/10/2017 01/04/2017  WBC 4.0 - 10.5 K/uL 7.0 9.0 5.2  Hemoglobin 13.0 - 17.0 g/dL 12.5(L) 13.7 14.1  Hematocrit 39.0 - 52.0 % 38.4(L) 41.6 42.3  Platelets 150 - 400 K/uL 189 226 232   BMP Latest Ref Rng & Units 03/11/2017 03/10/2017 01/04/2017  Glucose 65 - 99 mg/dL 105(H) 101(H) 85  BUN 6 - 20 mg/dL 10 13 12   Creatinine 0.61 - 1.24 mg/dL 0.95 1.11 1.04  Sodium 135 - 145 mmol/L 139 138 140  Potassium 3.5 - 5.1 mmol/L 3.9 3.9 4.5  Chloride 101 - 111 mmol/L 109 106 108  CO2 22 - 32 mmol/L 25 25 26   Calcium 8.9 - 10.3 mg/dL 8.3(L) 8.8(L) 8.9   PT/INR. 13.4/1.02 Troponin. <0.03 X 3  EKG: Sinus  rhythm with left anterior fascicular block and Q waves in lateral leads-no acute change.  CXR: FINDINGS: Monitoring leads overlie the patient. Normal cardiac and mediastinal contours. No consolidative pulmonary opacities. No pleural effusion or pneumothorax.  IMPRESSION: No acute cardiopulmonary process.  CT angio chest, abdomen and pelvis. FINDINGS: CTA CHEST FINDINGS  Cardiovascular: Normal heart size. No pericardial effusion. Coronary arterial vascular calcifications. Noncontrast images demonstrate no high attenuation within the thoracic aorta to suggest acute intramural hematoma. Post-contrast images demonstrate no thoracic aortic dissection. The ascending thoracic aorta measures 4 cm, unchanged from prior. The takeoff of the great vessels are patent. Few tiny atherosclerotic ulcers within the descending thoracic aorta (image 115; series 7).  Mediastinum/Nodes: No enlarged axillary, mediastinal or hilar lymphadenopathy. Normal esophagus.  Lungs/Pleura: Postsurgical changes compatible with laryngectomy. Layering debris within the distal trachea. Subpleural ground-glass opacities within the lower  lobes bilaterally. Centrilobular and paraseptal emphysematous change. Stable 6 mm fissural nodule within the right mid lung (image 86; series 8), compatible with benign etiology given stability over time. No pleural effusion or pneumothorax.  Musculoskeletal: Thoracic spine degenerative changes. No aggressive or acute appearing osseous lesions.  Review of the MIP images confirms the above findings.  CTA ABDOMEN AND PELVIS FINDINGS  VASCULAR  Aorta: Abdominal aorta is normal in caliber. Peripheral calcified and noncalcified atherosclerotic plaque. There is a focal short-segment dissection within the right common iliac artery (image 223; series 7).  Celiac: Celiac axis is patent. Branch vessels are patent.  SMA: Superior mesenteric artery is patent.  Renals: Two right-sided  and two left-sided renal arteries are patent without significant stenosis.  IMA: Patent without evidence of aneurysm, dissection, vasculitis or significant stenosis.  Veins: No obvious venous abnormality within the limitations of this arterial phase study.  Review of the MIP images confirms the above findings.  NON-VASCULAR  Hepatobiliary: Liver is normal in size and contour. Gallbladder is unremarkable.  Pancreas: Unremarkable  Spleen: Unremarkable  Adrenals/Urinary Tract: The adrenal glands are normal. Kidneys enhance symmetrically with contrast. No hydronephrosis. Urinary bladder is unremarkable.  Stomach/Bowel: Sigmoid colonic diverticulosis. No CT evidence for acute diverticulitis. No abnormal bowel wall thickening or evidence for bowel obstruction. Normal morphology of the stomach.  Lymphatic: No retroperitoneal lymphadenopathy.  Reproductive: Central dystrophic calcifications in the prostate.  Other: None.  Musculoskeletal: No aggressive or acute appearing osseous lesions. Lumbar spine degenerative changes.  Review of the MIP images confirms the above findings.  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  Ascending thoracic aorta measures 4 cm. Recommend annual imaging followup by CTA or MRA. This recommendation follows 2010 ACCF/AHA/AATS/ACR/ASA/SCA/SCAI/SIR/STS/SVM Guidelines for the Diagnosis and Management of Patients with Thoracic Aortic Disease. Circulation. 2010; 121: C376-E831  No acute process identified within the chest, abdomen or pelvis.  Carotid Doppler. 1-39% ICA plaquing. Vertebral artery flow is antegrade.   ECHO. Study Conclusions  - Left ventricle: The cavity size was normal. Systolic function was normal. The estimated ejection fraction was in the range of 55% to 60%. Possible hypokinesis of the basalinferolateral and inferior myocardium; consistent with ischemia  in the distribution of the right coronary artery. Doppler parameters are consistent with abnormal left ventricular relaxation (grade 1 diastolic dysfunction). - Left atrium: The atrium was mildly dilated.   Discharge Instructions: Discharge Instructions    Diet - low sodium heart healthy    Complete by:  As directed    Increase activity slowly    Complete by:  As directed       Signed: Lorella Nimrod, MD 03/11/2017, 6:52 PM   Pager: [phone]

## 2017-03-11 NOTE — Progress Notes (Signed)
Spoke with Bhagat PA about echo results. He stated he reviewed with Dr. Meda Coffee and ok to discharge with f/u out patient stress test and cardiology appt. Notified Dr. Reesa Chew. Cont to monitor. Carroll Kinds RN

## 2017-03-11 NOTE — Progress Notes (Signed)
  Echocardiogram 2D Echocardiogram has been performed.  Cory Sloan 03/11/2017, 10:41 AM

## 2017-03-12 ENCOUNTER — Encounter: Payer: Self-pay | Admitting: Cardiology

## 2017-03-12 DIAGNOSIS — I6529 Occlusion and stenosis of unspecified carotid artery: Secondary | ICD-10-CM | POA: Insufficient documentation

## 2017-03-14 ENCOUNTER — Telehealth: Payer: Self-pay | Admitting: Cardiology

## 2017-03-14 NOTE — Telephone Encounter (Signed)
-----   Message from Sueanne Margarita, MD sent at 03/12/2017 10:30 AM EDT ----- 1-39% bilateral carotid stenosis - repeat dopplers in 2 years

## 2017-03-14 NOTE — Telephone Encounter (Signed)
Informed patient's DPR of ECHO and carotid results and verbal understanding expressed.  She states the patient "hasn't really been himself" since his hospital stay - he has been very tired all day. Instructed her to have patient take it easy over the weekend and to call back if symptoms worsen tomorrow. Confirmed nuclear stress test for Monday and OV with Dr. [name] 5/17. She was grateful for call and agrees with treatment plan.

## 2017-03-14 NOTE — Telephone Encounter (Signed)
Patient wife returning your call for results. Patient wife states that Cory Sloan is still not doing well, he is fatigued.

## 2017-03-14 NOTE — Telephone Encounter (Signed)
-----   Message from Sueanne Margarita, MD sent at 03/14/2017 11:16 AM EDT ----- Please let patient know that echo showed EF 55-60% with mild basal inferolateral and basal inferior HK with mild LAE.  This is an old finding and no change from echo 2016  Traci ----- Message ----- From: Theodoro Parma, RN Sent: 03/14/2017   9:05 AM To: Sueanne Margarita, MD

## 2017-03-14 NOTE — Telephone Encounter (Signed)
Follow up    Pt wife is calling back

## 2017-03-15 ENCOUNTER — Telehealth (HOSPITAL_COMMUNITY): Payer: Self-pay

## 2017-03-15 NOTE — Telephone Encounter (Signed)
Encounter complete. 

## 2017-03-18 ENCOUNTER — Other Ambulatory Visit: Payer: Self-pay | Admitting: Cardiovascular Disease

## 2017-03-19 ENCOUNTER — Other Ambulatory Visit: Payer: Self-pay | Admitting: *Deleted

## 2017-03-19 MED ORDER — CLOPIDOGREL BISULFATE 75 MG PO TABS
75.0000 mg | ORAL_TABLET | Freq: Every day | ORAL | 6 refills | Status: DC
Start: 2017-03-19 — End: 2018-03-10

## 2017-03-19 NOTE — Telephone Encounter (Signed)
Refill pool not CVRR

## 2017-03-20 ENCOUNTER — Ambulatory Visit (HOSPITAL_COMMUNITY)
Admit: 2017-03-20 | Discharge: 2017-03-20 | Disposition: A | Discharge status: Home / Self Care | Payer: Medicare HMO | Source: Ambulatory Visit | Attending: Cardiology | Admitting: Cardiology

## 2017-03-20 DIAGNOSIS — J449 Chronic obstructive pulmonary disease, unspecified: Secondary | ICD-10-CM | POA: Insufficient documentation

## 2017-03-20 DIAGNOSIS — Z87891 Personal history of nicotine dependence: Secondary | ICD-10-CM | POA: Diagnosis not present

## 2017-03-20 DIAGNOSIS — Z85819 Personal history of malignant neoplasm of unspecified site of lip, oral cavity, and pharynx: Secondary | ICD-10-CM | POA: Insufficient documentation

## 2017-03-20 DIAGNOSIS — I252 Old myocardial infarction: Secondary | ICD-10-CM | POA: Diagnosis not present

## 2017-03-20 DIAGNOSIS — Z8249 Family history of ischemic heart disease and other diseases of the circulatory system: Secondary | ICD-10-CM | POA: Insufficient documentation

## 2017-03-20 DIAGNOSIS — Z955 Presence of coronary angioplasty implant and graft: Secondary | ICD-10-CM | POA: Insufficient documentation

## 2017-03-20 DIAGNOSIS — R0789 Other chest pain: Secondary | ICD-10-CM

## 2017-03-20 DIAGNOSIS — R9439 Abnormal result of other cardiovascular function study: Secondary | ICD-10-CM | POA: Insufficient documentation

## 2017-03-20 DIAGNOSIS — I2510 Atherosclerotic heart disease of native coronary artery without angina pectoris: Secondary | ICD-10-CM | POA: Insufficient documentation

## 2017-03-20 LAB — MYOCARDIAL PERFUSION IMAGING
CHL CUP RESTING HR STRESS: 55 {beats}/min
LVDIAVOL: 64 mL (ref 62–150)
LVSYSVOL: 119 mL
NUC STRESS TID: 1.16
Peak HR: 83 {beats}/min
SDS: 0
SRS: 4
SSS: 4

## 2017-03-20 MED ORDER — TECHNETIUM TC 99M TETROFOSMIN IV KIT
10.8000 | PACK | Freq: Once | INTRAVENOUS | Status: AC | PRN
  Administered 2017-03-20: 10.8 via INTRAVENOUS
  Filled 2017-03-20: qty 11

## 2017-03-20 MED ORDER — REGADENOSON 0.4 MG/5ML IV SOLN
0.4000 mg | Freq: Once | INTRAVENOUS | Status: AC
  Administered 2017-03-20: 0.4 mg via INTRAVENOUS

## 2017-03-20 MED ORDER — TECHNETIUM TC 99M TETROFOSMIN IV KIT
31.4000 | PACK | Freq: Once | INTRAVENOUS | Status: AC | PRN
  Administered 2017-03-20: 31.4 via INTRAVENOUS
  Filled 2017-03-20: qty 32

## 2017-03-26 ENCOUNTER — Telehealth: Payer: Self-pay | Admitting: Cardiovascular Disease

## 2017-03-26 NOTE — Telephone Encounter (Signed)
New message     Pt wife was calling for pt stress test results

## 2017-03-26 NOTE — Telephone Encounter (Signed)
Notes recorded by Leanor Kail, PA on 03/20/2017 at 3:21 PM EDT Low risk study. Finding constitent with prior MI. F/u with Dr. [name] as schedule later this month.  Patient's wife notified (DPR) Advised to keep appt on 5/17

## 2017-04-04 ENCOUNTER — Encounter: Payer: Self-pay | Admitting: Cardiovascular Disease

## 2017-04-04 ENCOUNTER — Ambulatory Visit (INDEPENDENT_AMBULATORY_CARE_PROVIDER_SITE_OTHER): Payer: Medicare HMO | Admitting: Cardiovascular Disease

## 2017-04-04 VITALS — BP 96/60 | HR 72 | Ht 71.0 in | Wt 171.0 lb

## 2017-04-04 DIAGNOSIS — E785 Hyperlipidemia, unspecified: Secondary | ICD-10-CM

## 2017-04-04 DIAGNOSIS — R0789 Other chest pain: Secondary | ICD-10-CM | POA: Diagnosis not present

## 2017-04-04 DIAGNOSIS — Z8521 Personal history of malignant neoplasm of larynx: Secondary | ICD-10-CM

## 2017-04-04 DIAGNOSIS — I2510 Atherosclerotic heart disease of native coronary artery without angina pectoris: Secondary | ICD-10-CM | POA: Diagnosis not present

## 2017-04-04 NOTE — Patient Instructions (Signed)
Your physician wants you to follow-up in: 6 months or sooner if needed. You will receive a reminder letter in the mail two months in advance. If you don't receive a letter, please call our office to schedule the follow-up appointment.   If you need a refill on your cardiac medications before your next appointment, please call your pharmacy. 

## 2017-04-05 NOTE — Progress Notes (Signed)
Patient ID: Cory Sloan, male   DOB: 12/27/1945, 71 y.o.   MRN: [id number]      HPI: Cory Sloan is a 71 y.o. male who presents to the office today for a 6 month follow-up evaluation.  Cory Sloan presented to [facility] 10/04/2015 with an acute ST segment elevation myocardial infarction secondary to total proximal occlusion of a large, dominant RCA.  He had a prior history of tobacco use and COPD in 2005 underwent laryngectomy for laryngeal CA.  There was no prior history of ischemic heart disease.  He was taken emergently to the catheterization laboratory and underwent successful PCI/DES stenting of a large dominant RCA with ultimate insertion of a 4.032 mm Synergy DES stent postdilated to 4.6 mm with the 100 percent occlusion reduced to 0% and restoration of brisk TIMI-3 flow.  Initial ejection fraction was 50% with mild mid inferior wall hypocontractility.  Post catheterization he developed cardiogenic shock and hypotension, which initially was refractory to IV fluid resuscitation and required dopamine.  A chest CT showed emphysematous disease.  He was seen by [name] on 2 occasions post hospitalization with the most recent being in December 2016.  An echo Doppler study at that time showed an EF of 50-55% with akinesis of the basal inferior myocardium and grade 1 diastolic dysfunction.  There was mild aortic sclerosis, mild mitral and trivial tricuspid regurgitation.  Blood pressure has been low and as result, he is only been able to be on very minimal medical therapy consisting of carvedilol one half of a 3.125 mg pill twice a day.  Initially he was on Brilinta but this was changed to Plavix due to shortness of breath issues which were felt possibly contributed to Brilinta.  A P2Y12 blood test was 210.  When I saw him in 2017, his LDL had risen to 146 and at that time recommended the addition of Zetia to his atorvastatin.  Apparently he had not taken the atorvastatin for month  prior to that blood work.  As result, he resume taking the atorvastatin at 80 mg daily and never started the Zetia.    Since I last saw him, he was evaluated in the emergency room on 03/10/2017 with chest pain.  The day prior to this episode, he had been pulling heavy dishes.  The following night he noticed a retrosternal chest discomfort that was different from his prior cardiac pain.  He presented to the emergency room.  Troponin was negative.  He underwent a CT angios an incidental finding of a right common iliac artery dissection in a short segment without associated decrease in pulse, pain, or paresthesias was notified.  The images were subtle.  Reviewed by Dr. Rufina Falco vascular surgery who felt that the imaging did not represent dissection.  All fully was felt that the patient's chest pain was due to his heavy lifting the day previously.  An echo Doppler study was done during that evaluation, which showed an EF of 55-60%. There was grade 1 diastolic dysfunction.  There is mild left atrial dilatation.  He underwent a nuclear stress test on 03/20/2017.  This was a low risk study showing an EF of 46%.  Findings were consistent with prior MI.  There was no ischemia.  Subsequently, he has been without any chest pain.  He continues to be active.  He denies shortness of breath.  He presents for evaluation.  Past Medical History:  Diagnosis Date  . Carotid artery stenosis    1-39% bilateral  stenosis  . COPD (chronic obstructive pulmonary disease) (HCC)   . HLD (hyperlipidemia)   . STEMI (ST elevation myocardial infarction) [facility] November 2016   with PCI to RCA and associated cardiogenic shock  . 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 Tobacco abuse     Past Surgical History:  Procedure Laterality Date  . APPENDECTOMY    . CARDIAC CATHETERIZATION N/A 10/04/2015   Procedure: Left Heart Cath and Coronary Angiography;  Surgeon: Lennette Bihari,  MD;  Location: [facility] INVASIVE CV LAB;  Service: Cardiovascular;  Laterality: N/A;  . CARDIAC CATHETERIZATION N/A 10/04/2015   Procedure: Coronary Stent Intervention;  Surgeon: Lennette Bihari, MD;  Location: [facility] INVASIVE CV LAB;  Service: Cardiovascular;  Laterality: N/A;  . THROAT SURGERY  2005   [place]    No Known Allergies  Current Outpatient Prescriptions  Medication Sig Dispense Refill  . albuterol (PROVENTIL HFA;VENTOLIN HFA) 108 (90 BASE) MCG/ACT inhaler Inhale 1-2 puffs into the lungs every 4 (four) hours as needed for wheezing or shortness of breath (or coughing). 1 Inhaler 0  . aspirin EC 81 MG EC tablet Take 1 tablet (81 mg total) by mouth daily.    Marland Kitchen atorvastatin (LIPITOR) 80 MG tablet TAKE 1 TABLET (80 MG TOTAL) BY MOUTH DAILY AT 6 PM. 30 tablet 8  . benzonatate (TESSALON) 200 MG capsule TAKE 1 CAPSULE (200 MG TOTAL) BY MOUTH 3 (THREE) TIMES DAILY AS NEEDED FOR COUGH. 30 capsule 1  . carvedilol (COREG) 3.125 MG tablet Take 1 tablet (3.125 mg total) by mouth 2 (two) times daily with a meal. 60 tablet 0  . clopidogrel (PLAVIX) 75 MG tablet Take 1 tablet (75 mg total) by mouth daily. 30 tablet 6  . nitroGLYCERIN (NITROSTAT) 0.4 MG SL tablet Place 1 tablet (0.4 mg total) under the tongue every 5 (five) minutes as needed for chest pain (CP or SOB). 25 tablet 12   No current facility-administered medications for this visit.     Social History   Social History  . Marital status: Married    Spouse name: N/A  . Number of children: N/A  . Years of education: N/A   Occupational History  . retired    Social History Main Topics  . Smoking status: Former Smoker    Packs/day: 1.50    Years: 40.00    Types: Cigarettes    Quit date: 08/20/2003  . Smokeless tobacco: Never Used  . Alcohol use No  . Drug use: No  . Sexual activity: Not Currently   Other Topics Concern  . Not on file   Social History Narrative  . No narrative on file   Socially he is married.  He quit smoking 13 years  ago.  Family History  Problem Relation Age of Onset  . Cancer - Other Mother   . Brain cancer Father   . CAD Brother     ROS General: Negative; No fevers, chills, or night sweats HEENT: Negative; No changes in vision or hearing, sinus congestion, difficulty swallowing Pulmonary: Positive for shortness of breath Cardiovascular: See HPI: No chest pain, presyncope, syncope, palpatations GI: Negative; No nausea, vomiting, diarrhea, or abdominal pain GU: Negative; No dysuria, hematuria, or difficulty voiding Musculoskeletal: Positive for occasional low back pain, right hip pain and sciatica discomfort. Hematologic/Oncology: History of throat cancer of left vocal cord, status post laryngectomy in 2005 Endocrine: Negative; no heat/cold intolerance; no diabetes, Neuro: Negative;  no changes in balance, headaches Skin: Negative; No rashes or skin lesions Psychiatric: Negative; No behavioral problems, depression Sleep: Negative; No snoring,  daytime sleepiness, hypersomnolence, bruxism, restless legs, hypnogognic hallucinations. Other comprehensive 14 point system review is negative   Physical Exam BP 96/60   Pulse 72   Ht '5\' 11"'$  (1.803 m)   Wt 171 lb (77.6 kg)   BMI 23.85 kg/m    Repeat blood pressure by me 106/66  Wt Readings from Last 3 Encounters:  04/04/17 171 lb (77.6 kg)  03/20/17 164 lb (74.4 kg)  03/11/17 164 lb 14.4 oz (74.8 kg)   General: Alert, oriented, no distress.  Skin: normal turgor, no rashes, warm and dry HEENT: Normocephalic, atraumatic. Pupils equal round and reactive to light; sclera anicteric; extraocular muscles intact;  Nose without nasal septal hypertrophy Mouth/Parynx benign; status post laryngectomy Neck: No JVD, no carotid bruits; normal carotid upstroke Lungs: clear to ausculatation and percussion; no wheezing or rales Chest wall: without tenderness to palpitation; specifically, no costochondral tenderness. Heart: PMI not displaced, RRR, s1 s2  normal, 1/6 systolic murmur, no diastolic murmur, no rubs, gallops, thrills, or heaves Abdomen: soft, nontender; no hepatosplenomehaly, BS+; abdominal aorta nontender and not dilated by palpation. Back: no CVA tenderness Pulses 2+ Musculoskeletal: full range of motion, normal strength, no joint deformities Extremities: no clubbing cyanosis or edema, Homan's sign negative  Neurologic: grossly nonfocal; Cranial nerves grossly wnl Psychologic: Normal mood and affect   January 03, 2017 ECG (independently read by me): Normal sinus rhythm at 62 bpm.  Left anterior hemiblock.  Inferior Q waves concordant with his prior MI with preserved R waves in lead 2.  Previously noted inferolateral T wave abnormalities.    August 2017 ECG (independently read by me): Sinus bradycardia 57 bpm.  Inferior infarct with Q waves and ST changes.  Left anterior hemiblock.  April 2017 ECG (independently read by me): Normal sinus rhythm at 72 bpm.  T-wave abnormality infererolaterally.  February 2017 ECG (independently read by me): Normal sinus rhythm.  Left anterior hemiblock.  Inferior Q waves with ST- T-wave abnormalities inferolaterally  LABS:  BMP Latest Ref Rng & Units 03/11/2017 03/10/2017 01/04/2017  Glucose 65 - 99 mg/dL 105(H) 101(H) 85  BUN 6 - 20 mg/dL '10 13 12  '$ Creatinine 0.61 - 1.24 mg/dL 0.95 1.11 1.04  Sodium 135 - 145 mmol/L 139 138 140  Potassium 3.5 - 5.1 mmol/L 3.9 3.9 4.5  Chloride 101 - 111 mmol/L 109 106 108  CO2 22 - 32 mmol/L '25 25 26  '$ Calcium 8.9 - 10.3 mg/dL 8.3(L) 8.8(L) 8.9     Hepatic Function Latest Ref Rng & Units 01/04/2017 01/13/2016 10/24/2015  Total Protein 6.1 - 8.1 g/dL 6.3 6.8 7.1  Albumin 3.6 - 5.1 g/dL 3.9 3.8 3.8  AST 10 - 35 U/L '25 30 23  '$ ALT 9 - 46 U/L '21 28 17  '$ Alk Phosphatase 40 - 115 U/L 64 72 86  Total Bilirubin 0.2 - 1.2 mg/dL 0.7 0.9 0.6  Bilirubin, Direct <=0.2 mg/dL - - 0.2    CBC Latest Ref Rng & Units 03/11/2017 03/10/2017 01/04/2017  WBC 4.0 - 10.5 K/uL 7.0  9.0 5.2  Hemoglobin 13.0 - 17.0 g/dL 12.5(L) 13.7 14.1  Hematocrit 39.0 - 52.0 % 38.4(L) 41.6 42.3  Platelets 150 - 400 K/uL 189 226 232   Lab Results  Component Value Date   MCV 90.6 03/11/2017   MCV 89.8 03/10/2017   MCV 88.5 01/04/2017    Lab Results  Component Value Date   TSH 5.36 (H) 01/04/2017    BNP No results found for: BNP  ProBNP    Component Value Date/Time   PROBNP 134.4 (H) 04/03/2013 2327     Lipid Panel     Component Value Date/Time   CHOL 116 01/04/2017 0851   TRIG 86 01/04/2017 0851   HDL 44 01/04/2017 0851   CHOLHDL 2.6 01/04/2017 0851   VLDL 17 01/04/2017 0851   LDLCALC 55 01/04/2017 0851     RADIOLOGY: Dg Chest Portable 1 View  Result Date: 03/10/2017 CLINICAL DATA:  Patient with mid chest pain.  Shortness of breath. EXAM: PORTABLE CHEST 1 VIEW COMPARISON:  Chest radiograph 10/07/2015 FINDINGS: Monitoring leads overlie the patient. Normal cardiac and mediastinal contours. No consolidative pulmonary opacities. No pleural effusion or pneumothorax. IMPRESSION: No acute cardiopulmonary process. Electronically Signed   By: Lovey Newcomer M.D.   On: 03/10/2017 08:37   Ct Angio Chest/abd/pel For Dissection W And/or W/wo  Result Date: 03/10/2017 CLINICAL DATA:  Patient with chest pain since 4 a.m. Evaluate for dissection. EXAM: CT ANGIOGRAPHY CHEST, ABDOMEN AND PELVIS TECHNIQUE: Multidetector CT imaging through the chest, abdomen and pelvis was performed using the standard protocol during bolus administration of intravenous contrast. Multiplanar reconstructed images and MIPs were obtained and reviewed to evaluate the vascular anatomy. CONTRAST:  100 cc Isovue 370 COMPARISON:  Chest radiograph 03/10/2017; chest CT 10/06/2015 FINDINGS: CTA CHEST FINDINGS Cardiovascular: Normal heart size. No pericardial effusion. Coronary arterial vascular calcifications. Noncontrast images demonstrate no high attenuation within the thoracic aorta to suggest acute intramural  hematoma. Post-contrast images demonstrate no thoracic aortic dissection. The ascending thoracic aorta measures 4 cm, unchanged from prior. The takeoff of the great vessels are patent. Few tiny atherosclerotic ulcers within the descending thoracic aorta (image 115; series 7). Mediastinum/Nodes: No enlarged axillary, mediastinal or hilar lymphadenopathy. Normal esophagus. Lungs/Pleura: Postsurgical changes compatible with laryngectomy. Layering debris within the distal trachea. Subpleural ground-glass opacities within the lower lobes bilaterally. Centrilobular and paraseptal emphysematous change. Stable 6 mm fissural nodule within the right mid lung (image 86; series 8), compatible with benign etiology given stability over time. No pleural effusion or pneumothorax. Musculoskeletal: Thoracic spine degenerative changes. No aggressive or acute appearing osseous lesions. Review of the MIP images confirms the above findings. CTA ABDOMEN AND PELVIS FINDINGS VASCULAR Aorta: Abdominal aorta is normal in caliber. Peripheral calcified and noncalcified atherosclerotic plaque. There is a focal short-segment dissection within the right common iliac artery (image 223; series 7). Celiac: Celiac axis is patent.  Branch vessels are patent. SMA: Superior mesenteric artery is patent. Renals: Two right-sided and two left-sided renal arteries are patent without significant stenosis. IMA: Patent without evidence of aneurysm, dissection, vasculitis or significant stenosis. Veins: No obvious venous abnormality within the limitations of this arterial phase study. Review of the MIP images confirms the above findings. NON-VASCULAR Hepatobiliary: Liver is normal in size and contour. Gallbladder is unremarkable. Pancreas: Unremarkable Spleen: Unremarkable Adrenals/Urinary Tract: The adrenal glands are normal. Kidneys enhance symmetrically with contrast. No hydronephrosis. Urinary bladder is unremarkable. Stomach/Bowel: Sigmoid colonic  diverticulosis. No CT evidence for acute diverticulitis. No abnormal bowel wall thickening or evidence for bowel obstruction. Normal morphology of the stomach. Lymphatic: No retroperitoneal lymphadenopathy. Reproductive: Central dystrophic calcifications in the prostate. Other: None. Musculoskeletal: No aggressive or acute appearing osseous lesions. Lumbar spine degenerative changes. Review of the MIP images confirms the above findings. 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 Ascending  thoracic aorta measures 4 cm. Recommend annual imaging followup by CTA or MRA. This recommendation follows 2010 ACCF/AHA/AATS/ACR/ASA/SCA/SCAI/SIR/STS/SVM Guidelines for the Diagnosis and Management of Patients with Thoracic Aortic Disease. Circulation. 2010; 121: A060-R561 No acute process identified within the chest, abdomen or pelvis. Electronically Signed   By: Lovey Newcomer M.D.   On: 03/10/2017 10:33    IMPRESSION:  No diagnosis found.  ASSESSMENT AND PLAN: Mr. Gaffin is a 71 year old WM who is suffered an acute inferior ST segment elevation myocardial infarction secondary to total proximal occlusion of a very large dominant RCA in November 2016.  Subsequent ejection fraction has improved to 50-55% on echocardiography with basal inferior akinesis. He has a history of COPD with emphysematous changes noted on CT scan which may be contributory to some of his shortness of breath.  He is on Plavix due to also potential concerns that Brilinta was contributing to some dyspnea.  He did recently developed chest pain which initially presented in middle of night.  However, the day.  Previously he had done significant yard work and was pulling 3.  Heavy bushes.  I reviewed his hospital records, CT images, and subsequent nuclear stress test and echo Doppler study.  He has been without recurrent anginal type symptomatology.  His nuclear stress test continues to be low  risk and there was no evidence for ischemia.  His blood pressure today is stable on carvedilol 3.125 g twice a day.  He continues to be on atorvastatin 80 mg with target LDL less than 70.  His most recent LDL was excellent at 55.  He is on aspirin and Plavix for dual antiplatelet therapy.  He uses albuterol on an as-needed basis for his intermittent wheezing.  He will continue his current medical regimen.  As long as he remains stable, I will see him in 6 months for reevaluation. Time spent: 25 minutes  Troy Sine, MD, [facility]  04/05/2017 6:30 PM

## 2017-04-06 ENCOUNTER — Other Ambulatory Visit: Payer: Self-pay | Admitting: Internal Medicine

## 2017-04-28 ENCOUNTER — Other Ambulatory Visit: Payer: Self-pay | Admitting: Internal Medicine

## 2017-04-29 ENCOUNTER — Other Ambulatory Visit: Payer: Self-pay | Admitting: Cardiovascular Disease

## 2017-04-29 NOTE — Telephone Encounter (Signed)
Rx(s) sent to pharmacy electronically.  

## 2017-07-08 ENCOUNTER — Other Ambulatory Visit: Payer: Self-pay | Admitting: Cardiovascular Disease

## 2017-09-18 ENCOUNTER — Encounter (HOSPITAL_COMMUNITY): Payer: Self-pay

## 2017-09-18 ENCOUNTER — Emergency Department (HOSPITAL_COMMUNITY)
Admission: EM | Admit: 2017-09-18 | Discharge: 2017-09-18 | Disposition: A | Discharge status: Home / Self Care | Payer: Medicare HMO | Attending: Emergency Medicine | Admitting: Emergency Medicine

## 2017-09-18 DIAGNOSIS — Z7982 Long term (current) use of aspirin: Secondary | ICD-10-CM | POA: Diagnosis not present

## 2017-09-18 DIAGNOSIS — Z7902 Long term (current) use of antithrombotics/antiplatelets: Secondary | ICD-10-CM | POA: Diagnosis not present

## 2017-09-18 DIAGNOSIS — Z79899 Other long term (current) drug therapy: Secondary | ICD-10-CM | POA: Diagnosis not present

## 2017-09-18 DIAGNOSIS — J449 Chronic obstructive pulmonary disease, unspecified: Secondary | ICD-10-CM | POA: Insufficient documentation

## 2017-09-18 DIAGNOSIS — Z85818 Personal history of malignant neoplasm of other sites of lip, oral cavity, and pharynx: Secondary | ICD-10-CM | POA: Diagnosis not present

## 2017-09-18 DIAGNOSIS — I2510 Atherosclerotic heart disease of native coronary artery without angina pectoris: Secondary | ICD-10-CM | POA: Diagnosis not present

## 2017-09-18 DIAGNOSIS — I252 Old myocardial infarction: Secondary | ICD-10-CM | POA: Diagnosis not present

## 2017-09-18 DIAGNOSIS — Z87891 Personal history of nicotine dependence: Secondary | ICD-10-CM | POA: Insufficient documentation

## 2017-09-18 DIAGNOSIS — L02414 Cutaneous abscess of left upper limb: Secondary | ICD-10-CM | POA: Insufficient documentation

## 2017-09-18 DIAGNOSIS — Z23 Encounter for immunization: Secondary | ICD-10-CM | POA: Insufficient documentation

## 2017-09-18 MED ORDER — ACETAMINOPHEN 325 MG PO TABS
650.0000 mg | ORAL_TABLET | Freq: Once | ORAL | Status: AC
  Administered 2017-09-18: 650 mg via ORAL
  Filled 2017-09-18: qty 2

## 2017-09-18 MED ORDER — LIDOCAINE-EPINEPHRINE (PF) 2 %-1:200000 IJ SOLN
10.0000 mL | Freq: Once | INTRAMUSCULAR | Status: AC
  Administered 2017-09-18: 10 mL
  Filled 2017-09-18: qty 20

## 2017-09-18 MED ORDER — TETANUS-DIPHTH-ACELL PERTUSSIS 5-2.5-18.5 LF-MCG/0.5 IM SUSP
0.5000 mL | Freq: Once | INTRAMUSCULAR | Status: AC
  Administered 2017-09-18: 0.5 mL via INTRAMUSCULAR
  Filled 2017-09-18: qty 0.5

## 2017-09-18 MED ORDER — HYDROCODONE-ACETAMINOPHEN 5-325 MG PO TABS: 1.0000 | ORAL_TABLET | ORAL | 0 refills | Status: DC | PRN

## 2017-09-18 MED ORDER — SULFAMETHOXAZOLE-TRIMETHOPRIM 800-160 MG PO TABS: 2.0000 | ORAL_TABLET | Freq: Two times a day (BID) | ORAL | 0 refills | Status: AC

## 2017-09-18 MED ORDER — OXYCODONE HCL 5 MG PO TABS
5.0000 mg | ORAL_TABLET | Freq: Once | ORAL | Status: AC
  Administered 2017-09-18: 5 mg via ORAL
  Filled 2017-09-18: qty 1

## 2017-09-18 NOTE — ED Notes (Signed)
Pt informed this RN that his arm was hurting more than it was upon check-in. EDP notified.

## 2017-09-18 NOTE — ED Provider Notes (Signed)
[facility] EMERGENCY DEPARTMENT Provider Note   CSN: [id number] Arrival date & time: 09/18/17  1028     History   Chief Complaint No chief complaint on file.   HPI Cory Sloan is a 71 y.o. male.  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  Patient denies any known injury to this arm, but does believe that he was bit by an insect that he did not see.  Patient reports that it has been getting progressively larger, and he noticed last night that it began to drain purulence after he compressed it.  Patient reports he had some chills yesterday, but denies any fevers.  Patient reports that he had some paresthesias distal to the site of the infection in the left arm this morning, but denies any weakness, extremity discoloration, nausea, vomiting, chest pain, or shortness of breath.  Patient has tried applying Epsom salt soaks to the wound without relief.  Past Medical History:  Diagnosis Date  . Carotid artery stenosis    1-39% bilateral stenosis  . COPD (chronic obstructive pulmonary disease) ([place])   . HLD (hyperlipidemia)   . STEMI (ST elevation myocardial infarction) [facility]) November 2016   with PCI to RCA and associated cardiogenic shock  . 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 Tobacco abuse     Patient Active Problem List   Diagnosis Date Noted  . Carotid artery stenosis   . Abdominal aortic atherosclerosis ([place]) 03/11/2017  . Thoracic aortic aneurysm without rupture ([place]) 03/11/2017  . Chest pain, musculoskeletal 03/10/2017  . Coronary artery disease involving native coronary artery of native heart without angina pectoris   . CAD in native artery 06/26/2016  . Hyperlipidemia LDL goal <70 03/11/2016  . CAP (community acquired pneumonia) 10/14/2015  . Hemoptysis   . Chronic  obstructive pulmonary disease ([place])   . 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 ST elevation (STEMI) myocardial infarction involving right coronary artery ([place]) 10/04/2015  . ST elevation myocardial infarction involving right coronary artery [facility])     Past Surgical History:  Procedure Laterality Date  . APPENDECTOMY    . CARDIAC CATHETERIZATION N/A 10/04/2015   Procedure: Left Heart Cath and Coronary Angiography;  Surgeon: Troy Sine, MD;  Location: [facility] CV LAB;  Service: Cardiovascular;  Laterality: N/A;  . CARDIAC CATHETERIZATION N/A 10/04/2015   Procedure: Coronary Stent Intervention;  Surgeon: Troy Sine, MD;  Location: [facility] CV LAB;  Service: Cardiovascular;  Laterality: N/A;  . THROAT SURGERY  2005   [place]       Home Medications    Prior to Admission medications   Medication Sig Start Date End Date Taking? Authorizing Provider  albuterol (PROVENTIL HFA;VENTOLIN HFA) 108 (90 BASE) MCG/ACT inhaler Inhale 1-2 puffs into the lungs every 4 (four) hours as needed for wheezing or shortness of breath (or coughing). 09/29/15   Mesner, Corene Cornea, MD  aspirin EC 81 MG EC tablet Take 1 tablet (81 mg total) by mouth daily. 10/07/15   Leanor Kail, PA  atorvastatin (LIPITOR) 80 MG tablet TAKE 1 TABLET (80 MG TOTAL) BY MOUTH DAILY AT 6 PM. 01/08/17   Troy Sine, MD  benzonatate (TESSALON) 200 MG capsule TAKE 1 CAPSULE (200 MG TOTAL) BY MOUTH 3 (THREE) TIMES DAILY AS NEEDED FOR  COUGH. 01/20/16   Baird Lyons D, MD  carvedilol (COREG) 3.125 MG tablet TAKE 1 AND 1/2 TABLETS BY MOUTH TWICE DAILY 07/08/17   Troy Sine, MD  clopidogrel (PLAVIX) 75 MG tablet Take 1 tablet (75 mg total) by mouth daily. 03/19/17   Troy Sine, MD  HYDROcodone-acetaminophen (NORCO/VICODIN) 5-325 MG tablet Take 1 tablet by mouth every 4 (four) hours as needed. 09/18/17   Langston Masker B, PA-C  nitroGLYCERIN (NITROSTAT) 0.4 MG SL tablet Place 1 tablet (0.4 mg total) under the tongue every 5  (five) minutes as needed for chest pain (CP or SOB). 10/07/15   Bhagat, Crista Luria, PA  sulfamethoxazole-trimethoprim (BACTRIM DS,SEPTRA DS) 800-160 MG tablet Take 2 tablets by mouth 2 (two) times daily. 09/18/17 09/23/17  Albesa Seen, PA-C    Family History Family History  Problem Relation Age of Onset  . Cancer - Other Mother   . Brain cancer Father   . CAD Brother     Social History Social History  Substance Use Topics  . Smoking status: Former Smoker    Packs/day: 1.50    Years: 40.00    Types: Cigarettes    Quit date: 08/20/2003  . Smokeless tobacco: Never Used  . Alcohol use No     Allergies   Patient has no known allergies.   Review of Systems Review of Systems  Constitutional: Positive for chills. Negative for fever.  Respiratory: Negative for shortness of breath.   Cardiovascular: Negative for chest pain.  Gastrointestinal: Negative for nausea and vomiting.  Skin: Positive for color change and wound.  Neurological: Negative for weakness and numbness.       + paresthesias     Physical Exam Updated Vital Signs BP 106/68 (BP Location: Right Arm)   Pulse 87   Temp 97.9 F (36.6 C) (Oral)   Resp 18   SpO2 99%   Physical Exam  Constitutional: He appears well-developed and well-nourished. No distress.  Sitting comfortably in bed.  HENT:  Head: Normocephalic and atraumatic.  Eyes: Conjunctivae are normal. Right eye exhibits no discharge. Left eye exhibits no discharge.  EOMs normal to gross examination.  Neck: Normal range of motion.  Patient has laryngectomy stoma that is intact and covered.   Cardiovascular: Normal rate and regular rhythm.   Intact, 2+ radial pulse distal to site of abscess.  Pulmonary/Chest:  Normal respiratory effort. Patient converses comfortably. No audible wheeze or stridor.  Abdominal: He exhibits no distension.  Musculoskeletal: Normal range of motion.  Neurological: He is alert.  Cranial nerves intact to gross  observation. Sensation intact to light touch in distal left extremity.  Skin: Skin is warm and dry. He is not diaphoretic.  Approximately 1.5 cm x 1.5 cm area of fluctuance in the mid left forearm with small amount of surrounding induration.  Minor surrounding erythema.  See picture.  Psychiatric: He has a normal mood and affect. His behavior is normal. Judgment and thought content normal.  Nursing note and vitals reviewed.      ED Treatments / Results  Labs (all labs ordered are listed, but only abnormal results are displayed) Labs Reviewed - No data to display  EKG  EKG Interpretation None       Radiology No results found.  Procedures .Marland KitchenIncision and Drainage Date/Time: 09/18/2017 1:56 PM Performed by: Albesa Seen Authorized by: Langston Masker B   Consent:    Consent obtained:  Verbal   Consent given by:  Patient   Risks discussed:  Bleeding, incomplete drainage, pain and infection Location:    Type:  Abscess   Location:  Upper extremity   Upper extremity location:  Arm   Arm location:  L lower arm Pre-procedure details:    Procedure prep: Alcohol. Anesthesia (see MAR for exact dosages):    Anesthesia method:  Local infiltration   Local anesthetic:  Lidocaine 2% WITH epi Procedure type:    Complexity:  Simple Procedure details:    Needle aspiration: no     Incision types:  Stab incision   Incision depth:  Dermal   Scalpel blade:  11   Wound management:  Probed and deloculated   Drainage:  Purulent   Drainage amount:  Scant   Wound treatment:  Wound left open   Packing materials:  None Post-procedure details:    Patient tolerance of procedure:  Tolerated well, no immediate complications   (including critical care time)  Medications Ordered in ED Medications  lidocaine-EPINEPHrine (XYLOCAINE W/EPI) 2 %-1:200000 (PF) injection 10 mL (10 mLs Infiltration Given 09/18/17 1237)  Tdap (BOOSTRIX) injection 0.5 mL (0.5 mLs Intramuscular Given 09/18/17  1247)  acetaminophen (TYLENOL) tablet 650 mg (650 mg Oral Given 09/18/17 1304)  oxyCODONE (Oxy IR/ROXICODONE) immediate release tablet 5 mg (5 mg Oral Given 09/18/17 1313)     Initial Impression / Assessment and Plan / ED Course  I have reviewed the triage vital signs and the nursing notes.  Pertinent labs & imaging results that were available during my care of the patient were reviewed by me and considered in my medical decision making (see chart for details).      Final Clinical Impressions(s) / ED Diagnoses   Final diagnoses:  Abscess of arm, left    Patient is well-appearing, afebrile, in no acute distress.  Abscess of the left forearm was marsupialized and drained.  Per ultrasound, there appears to be surrounding cobblestoning consistent with cellulitis.  No packing in place.  Will treat with Bactrim.  Patient instructed that he must follow-up in 48 hours for a recheck of his wound.  Return precautions given for any fever, chills, worsening erythema, redevelopment of abscess, or worsening pain.  Patient is in understanding and agrees with the plan of care.  I have reviewed the patient's information in the [facility] for the past 12 months and found them to have no prescriptions. Opiates were prescribed for an acute, painful condition. The patient was given information on side effects and encouraged to use other, non-opiate pain medication primary, only using opiate medicine sparingly for severe pain.  This is a shared visit with Dr. Tanna Furry. Patient was independently evaluated by this attending physician. Attending physician consulted in evaluation and discharge management.   Albesa Seen, PA-C 09/18/17 1357    Tanna Furry, MD 09/30/17 0830

## 2017-09-18 NOTE — ED Triage Notes (Signed)
Patient here with left forearm redness x 4 days, thinks spider bite, painful to touch

## 2017-09-18 NOTE — Discharge Instructions (Signed)
Please see the information and instructions below regarding your visit.  Your diagnoses today include:  1. Abscess of arm, left     Abscesses form when an infection in your skin starts to collect bacteria and white blood cells, walling it off from the rest of your body to protect you from a bigger infection. Risk factors for this type of infection include:  ?Break in the skin ?Diabetes ?Swollen areas  Sometimes the infection starts to spread to surrounding tissue, causing redness and swelling. We call this cellulitis.   Tests performed today include: See side panel of your discharge paperwork for testing performed today. Vital signs are listed at the bottom of these instructions.   Medications prescribed:    Take any prescribed medications only as prescribed, and any over the counter medications only as directed on the packaging.  1. Antibiotic. Please take all of your antibiotics until finished.   You may develop abdominal discomfort or nausea from the antibiotic. If this occurs, you may take it with food. Some patients also get diarrhea with antibiotics. You may help offset this with probiotics which you can buy or get in yogurt. Do not eat or take the probiotics until 2 hours after your antibiotic.    Some people develop allergies to antibiotics. Symptoms of antibiotic allergy can be mild and include a flat rash and itching. They can also be more serious and include:  ?Hives - Hives are raised, red patches of skin that are usually very itchy.  ?Lip or tongue swelling  ?Trouble swallowing or breathing  ?Blistering of the skin or mouth.  If you have any of these serious symptoms, please seek emergency medical care immediately.    2. Pain. You have been prescribed Norco for pain. This is an opioid pain medication. You may take this medication every 4-6 hours as needed for pain. Only take this medication if you need it for breakthrough pain. You may combine this medicine with  Tylenol, a non-steroidal anti-inflammatory drug (NSAID) every 6 hours, so you are getting something for pain relief every 3 hours.  Do not combine this medication with Tylenol, as it may increase the risk of liver problems.  Do not combine this medication with alcohol.  Please be advised to avoid driving or operating heavy machinery while taking this medication, as it may make you drowsy or impair judgment.    Home care instructions:  Please follow any educational materials contained in this packet.   Some things that may promote healing of your wound and infection include:  Raise your arm or leg to reduce swelling - Raise the arm or leg up above the level of your heart 3 or 4 times a day, for 30 minutes each time. Keep the infected area clean and dry. You can take a shower or bath, but be sure to pat the area dry with a towel afterward. Do not put any antibiotic ointments or creams on the area. Reapply a dry gauze dressing any time the bandage has become soaked with drainage, or after cleansing the wound.  Apply warm compresses to the wound 3-4 times daily to encourage drainage.   Return instructions:  Please return to the Emergency Department if you experience worsening symptoms. You should return for reevaluation of your infection if you notice spreading redness, increased swelling, an abscess develops, or you develop signs and symptoms of a systemic illness such as fever and chills.  Please return if you have any other emergent concerns.  Additional Information:  Your vital signs today were: BP 110/75    Pulse 78    Temp 98.4 F (36.9 C) (Oral)    Resp 18    SpO2 99%  If your blood pressure (BP) was elevated on multiple readings during this visit above 130 for the top number or above 80 for the bottom number, please have this repeated by your primary care provider within one month. --------------  Thank you for allowing [place] to participate in your care today.

## 2017-09-30 NOTE — ED Provider Notes (Signed)
Patient seen and evaluated. Forearm abscess the examined bedside ultrasound. I agree with incision and drainage. Patient reexamined after this was completed by PA. He is neurologically neurovascular intact distal to the abscess before, and postprocedure.   Tanna Furry, MD 09/30/17 [phone]

## 2017-10-04 ENCOUNTER — Other Ambulatory Visit: Payer: Self-pay | Admitting: Cardiovascular Disease

## 2017-10-14 ENCOUNTER — Other Ambulatory Visit: Payer: Self-pay | Admitting: Cardiovascular Disease

## 2017-12-28 IMAGING — NM NM MISC PROCEDURE
9 series · 54 of 54 positions shown · non-contrast
Comparison: none

[Series 1: wbr rest · 6.40mm/px · 6 of 64 frames shown]
[frame 6/64]
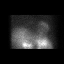
[frame 16/64]
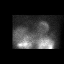
[frame 27/64]
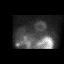
[frame 38/64]
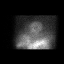
[frame 48/64]
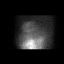
[frame 59/64]
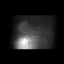

[Series 1: wbr_r-proj_st wbr rest · 6.40mm/px · 6 of 64 frames shown]
[frame 6/64]
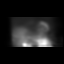
[frame 16/64]
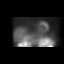
[frame 27/64]
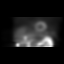
[frame 38/64]
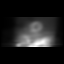
[frame 48/64]
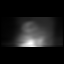
[frame 59/64]
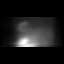

[Series 1: rest sax · 6.4mm · 6.40mm/px · 6 of 20 frames shown]
[frame 2/20]
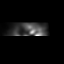
[frame 5/20]
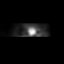
[frame 9/20]
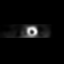
[frame 12/20]
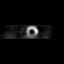
[frame 15/20]
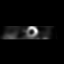
[frame 19/20]
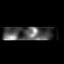

[Series 2: wbr stress-gsp · 6.40mm/px · 6 of 512 frames shown]
[frame 43/512]
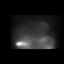
[frame 128/512]
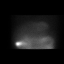
[frame 214/512]
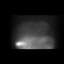
[frame 299/512]
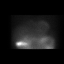
[frame 384/512]
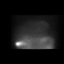
[frame 470/512]
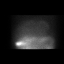

[Series 2: wbr_s-proj_st wbr stress-gsp · 6.40mm/px · 6 of 512 frames shown]
[frame 43/512]
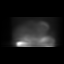
[frame 128/512]
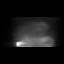
[frame 214/512]
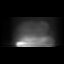
[frame 299/512]
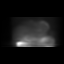
[frame 384/512]
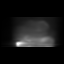
[frame 470/512]
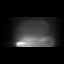

[Series 2: stress sax gs · 6.4mm · 6.40mm/px · 6 of 168 frames shown]
[frame 15/168]
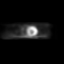
[frame 43/168]
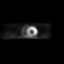
[frame 71/168]
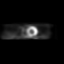
[frame 99/168]
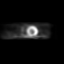
[frame 127/168]
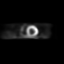
[frame 155/168]
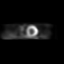

[Series 2: stress sax · 6.4mm · 6.40mm/px · 6 of 21 frames shown]
[frame 2/21]
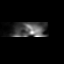
[frame 6/21]
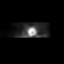
[frame 9/21]
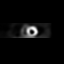
[frame 13/21]
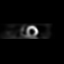
[frame 16/21]
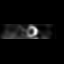
[frame 20/21]
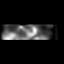

[Series 3: wbr stress-sum-em · 6.40mm/px · 6 of 64 frames shown]
[frame 6/64]
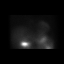
[frame 16/64]
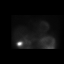
[frame 27/64]
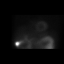
[frame 38/64]
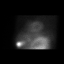
[frame 48/64]
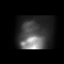
[frame 59/64]
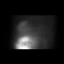

[Series 3: wbr_s-proj_st wbr stress-sum-em · 6.40mm/px · 6 of 64 frames shown]
[frame 6/64]
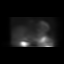
[frame 16/64]
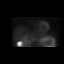
[frame 27/64]
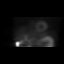
[frame 38/64]
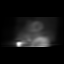
[frame 48/64]
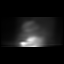
[frame 59/64]
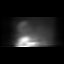

[54 of 54 positions shown; findings below may reference images not displayed]

Canned report from images found in remote index.

Refer to host system for actual result text.

## 2018-03-10 ENCOUNTER — Ambulatory Visit (INDEPENDENT_AMBULATORY_CARE_PROVIDER_SITE_OTHER): Payer: Medicare HMO | Admitting: Family Medicine

## 2018-03-10 ENCOUNTER — Encounter: Payer: Self-pay | Admitting: Family Medicine

## 2018-03-10 VITALS — BP 98/59 | HR 67 | Temp 97.7°F | Ht 71.0 in | Wt 170.2 lb

## 2018-03-10 DIAGNOSIS — R251 Tremor, unspecified: Secondary | ICD-10-CM | POA: Diagnosis not present

## 2018-03-10 DIAGNOSIS — E162 Hypoglycemia, unspecified: Secondary | ICD-10-CM | POA: Diagnosis not present

## 2018-03-10 DIAGNOSIS — E785 Hyperlipidemia, unspecified: Secondary | ICD-10-CM | POA: Diagnosis not present

## 2018-03-10 DIAGNOSIS — N400 Enlarged prostate without lower urinary tract symptoms: Secondary | ICD-10-CM | POA: Diagnosis not present

## 2018-03-10 LAB — BAYER DCA HB A1C WAIVED: HB A1C (BAYER DCA - WAIVED): 5.5 % (ref <7.0)

## 2018-03-10 MED ORDER — ATORVASTATIN CALCIUM 80 MG PO TABS: ORAL_TABLET | ORAL | 1 refills | Status: DC

## 2018-03-10 MED ORDER — CLOPIDOGREL BISULFATE 75 MG PO TABS: 75.0000 mg | ORAL_TABLET | Freq: Every day | ORAL | 1 refills | Status: DC

## 2018-03-10 MED ORDER — CARVEDILOL 3.125 MG PO TABS: 4.6875 mg | ORAL_TABLET | Freq: Two times a day (BID) | ORAL | 2 refills | Status: DC

## 2018-03-10 MED ORDER — ALBUTEROL SULFATE HFA 108 (90 BASE) MCG/ACT IN AERS: 1.0000 | INHALATION_SPRAY | RESPIRATORY_TRACT | 0 refills | Status: DC | PRN

## 2018-03-10 NOTE — Progress Notes (Signed)
Subjective:  Patient ID: Cory Sloan, male    DOB: 1946/08/12  Age: 72 y.o. MRN: [id number]  CC: New Patient (Initial Visit) (pt here today to establish care)   HPI Cory Sloan presents for establishment as a new patient.  His primary diagnosis is been related to his coronary artery disease.  He also has had laryngeal cancer 16 years ago.  He is in remission for most of that time a urine laryngectomy in [place] speaks with a microphone device.  His MI occurred in November 2016.  He currently takes atorvastatin and clopidogrel as well as carvedilol to control risk factors.  Patient in for follow-up of elevated cholesterol. Doing well without complaints on current medication. Denies side effects of statin including myalgia and arthralgia and nausea. Also in today for liver function testing. Currently no chest pain, shortness of breath or other cardiovascular related symptoms noted.  Patient is having recurring sweating spells beginning with nausea.  These last 30-40 minutes and leaving feeling weak.  There is no chest pain or palpitation associated but he is concerned that it might be related to his heart.  He is having this occur 3-4 times per year.  It resolves spontaneously.  It is not related to any activity.  He denies any shortness of breath.  There is no edema.  Depression screen PHQ 2/9 03/10/2018  Decreased Interest 1  Down, Depressed, Hopeless 1  PHQ - 2 Score 2  Altered sleeping 1  Tired, decreased energy 2  Change in appetite 2  Feeling bad or failure about yourself  2  Trouble concentrating 0  Moving slowly or fidgety/restless 1  Suicidal thoughts 0  PHQ-9 Score 10    History Cory Sloan has a past medical history of Carotid artery stenosis, COPD (chronic obstructive pulmonary disease) ([place]), HLD (hyperlipidemia), ST elevation (STEMI) myocardial infarction involving right coronary artery ([address]) (10/04/2015), STEMI (ST elevation myocardial infarction) [facility]) (November  2016), Throat cancer [facility]), and Tobacco abuse.   He has a past surgical history that includes Appendectomy; Throat surgery (2005); Cardiac catheterization (N/A, 10/04/2015); and Cardiac catheterization (N/A, 10/04/2015).   His family history includes Brain cancer in his father; CAD in his brother; Cancer - Other in his mother.He reports that he quit smoking about 14 years ago. His smoking use included cigarettes. He has a 60.00 pack-year smoking history. He has never used smokeless tobacco. He reports that he does not drink alcohol or use drugs.    ROS Review of Systems  Constitutional: Negative.   HENT: Negative.   Eyes: Negative for visual disturbance.  Respiratory: Negative for cough and shortness of breath.   Cardiovascular: Negative for chest pain and leg swelling.  Gastrointestinal: Negative for abdominal pain, diarrhea, nausea and vomiting.  Genitourinary: Negative for difficulty urinating.  Musculoskeletal: Negative for arthralgias and myalgias.  Skin: Negative for rash.  Neurological: Negative for headaches.  Psychiatric/Behavioral: Negative for sleep disturbance.    Objective:  BP (!) 98/59   Pulse 67   Temp 97.7 F (36.5 C) (Oral)   Ht '5\' 11"'$  (1.803 m)   Wt 170 lb 4 oz (77.2 kg)   BMI 23.75 kg/m   BP Readings from Last 3 Encounters:  03/10/18 (!) 98/59  09/18/17 106/68  04/04/17 96/60    Wt Readings from Last 3 Encounters:  03/10/18 170 lb 4 oz (77.2 kg)  04/04/17 171 lb (77.6 kg)  03/20/17 164 lb (74.4 kg)     Physical Exam  Constitutional: He is oriented to  person, place, and time. He appears well-developed and well-nourished. No distress.  HENT:  Head: Normocephalic and atraumatic.  Right Ear: External ear normal.  Left Ear: External ear normal.  Nose: Nose normal.  Mouth/Throat: Oropharynx is clear and moist.  Eyes: Pupils are equal, round, and reactive to light. Conjunctivae and EOM are normal.  Neck: Normal range of motion. Neck supple. No  thyromegaly present.  Cardiovascular: Normal rate, regular rhythm and normal heart sounds.  No murmur heard. Pulmonary/Chest: Effort normal and breath sounds normal. No respiratory distress. He has no wheezes. He has no rales.  Abdominal: Soft. Bowel sounds are normal. He exhibits no distension. There is no tenderness.  Lymphadenopathy:    He has no cervical adenopathy.  Neurological: He is alert and oriented to person, place, and time. He has normal reflexes.  Skin: Skin is warm and dry.  Psychiatric: He has a normal mood and affect. His behavior is normal. Judgment and thought content normal.      Assessment & Plan:   Cory Sloan was seen today for new patient (initial visit).  Diagnoses and all orders for this visit:  Hyperlipidemia LDL goal <70 -     CBC with Differential/Platelet -     CMP14+EGFR -     Lipid panel  Shakiness -     TSH -     Bayer DCA Hb A1c Waived  Benign prostatic hyperplasia, unspecified whether lower urinary tract symptoms present -     PSA, total and free  Hypoglycemia -     Glucagon  Other orders -     albuterol (PROVENTIL HFA;VENTOLIN HFA) 108 (90 Base) MCG/ACT inhaler; Inhale 1-2 puffs into the lungs every 4 (four) hours as needed for wheezing or shortness of breath (or coughing). -     atorvastatin (LIPITOR) 80 MG tablet; TAKE 1 TABLET (80 MG TOTAL) BY MOUTH DAILY AT 6 PM. -     carvedilol (COREG) 3.125 MG tablet; Take 1.5 tablets (4.6875 mg total) by mouth 2 (two) times daily. -     clopidogrel (PLAVIX) 75 MG tablet; Take 1 tablet (75 mg total) by mouth daily.       I have discontinued Cory Sloan's benzonatate, clopidogrel, and HYDROcodone-acetaminophen. I have also changed his carvedilol and clopidogrel. Additionally, I am having him maintain his aspirin, nitroGLYCERIN, albuterol, and atorvastatin.  Allergies as of 03/10/2018   No Known Allergies     Medication List        Accurate as of 03/10/18  9:40 PM. Always use your most  recent med list.          albuterol 108 (90 Base) MCG/ACT inhaler Commonly known as:  PROVENTIL HFA;VENTOLIN HFA Inhale 1-2 puffs into the lungs every 4 (four) hours as needed for wheezing or shortness of breath (or coughing).   aspirin 81 MG EC tablet Take 1 tablet (81 mg total) by mouth daily.   atorvastatin 80 MG tablet Commonly known as:  LIPITOR TAKE 1 TABLET (80 MG TOTAL) BY MOUTH DAILY AT 6 PM.   carvedilol 3.125 MG tablet Commonly known as:  COREG Take 1.5 tablets (4.6875 mg total) by mouth 2 (two) times daily.   clopidogrel 75 MG tablet Commonly known as:  PLAVIX Take 1 tablet (75 mg total) by mouth daily.   nitroGLYCERIN 0.4 MG SL tablet Commonly known as:  NITROSTAT Place 1 tablet (0.4 mg total) under the tongue every 5 (five) minutes as needed for chest pain (CP or SOB).  Follow-up: Return in about 3 months (around 06/09/2018).  Claretta Fraise, M.D.

## 2018-03-11 LAB — CBC WITH DIFFERENTIAL/PLATELET
Basophils Absolute: 0 10*3/uL (ref 0.0–0.2)
Basos: 1 %
EOS (ABSOLUTE): 0.3 10*3/uL (ref 0.0–0.4)
Eos: 4 %
Hematocrit: 41.3 % (ref 37.5–51.0)
Hemoglobin: 14 g/dL (ref 13.0–17.7)
IMMATURE GRANULOCYTES: 0 %
Immature Grans (Abs): 0 10*3/uL (ref 0.0–0.1)
Lymphocytes Absolute: 2.4 10*3/uL (ref 0.7–3.1)
Lymphs: 34 %
MCH: 29.8 pg (ref 26.6–33.0)
MCHC: 33.9 g/dL (ref 31.5–35.7)
MCV: 88 fL (ref 79–97)
MONOS ABS: 0.7 10*3/uL (ref 0.1–0.9)
Monocytes: 9 %
NEUTROS PCT: 52 %
Neutrophils Absolute: 3.6 10*3/uL (ref 1.4–7.0)
PLATELETS: 241 10*3/uL (ref 150–379)
RBC: 4.7 x10E6/uL (ref 4.14–5.80)
RDW: 13.6 % (ref 12.3–15.4)
WBC: 7 10*3/uL (ref 3.4–10.8)

## 2018-03-11 LAB — CMP14+EGFR
A/G RATIO: 1.7 (ref 1.2–2.2)
ALK PHOS: 75 IU/L (ref 39–117)
ALT: 20 IU/L (ref 0–44)
AST: 22 IU/L (ref 0–40)
Albumin: 4 g/dL (ref 3.5–4.8)
BUN/Creatinine Ratio: 11 (ref 10–24)
BUN: 11 mg/dL (ref 8–27)
Bilirubin Total: 0.5 mg/dL (ref 0.0–1.2)
CALCIUM: 9.4 mg/dL (ref 8.6–10.2)
CO2: 25 mmol/L (ref 20–29)
Chloride: 102 mmol/L (ref 96–106)
Creatinine, Ser: 1.04 mg/dL (ref 0.76–1.27)
GFR calc Af Amer: 83 mL/min/{1.73_m2} (ref >59)
GFR, EST NON AFRICAN AMERICAN: 72 mL/min/{1.73_m2} (ref >59)
GLOBULIN, TOTAL: 2.3 g/dL (ref 1.5–4.5)
Glucose: 79 mg/dL (ref 65–99)
POTASSIUM: 4.5 mmol/L (ref 3.5–5.2)
SODIUM: 142 mmol/L (ref 134–144)
Total Protein: 6.3 g/dL (ref 6.0–8.5)

## 2018-03-11 LAB — PSA, TOTAL AND FREE
PSA FREE PCT: 53.3 %
PSA FREE: 0.48 ng/mL
Prostate Specific Ag, Serum: 0.9 ng/mL (ref 0.0–4.0)

## 2018-03-11 LAB — LIPID PANEL
CHOL/HDL RATIO: 3.1 ratio (ref 0.0–5.0)
CHOLESTEROL TOTAL: 127 mg/dL (ref 100–199)
HDL: 41 mg/dL (ref >39)
LDL Calculated: 47 mg/dL (ref 0–99)
TRIGLYCERIDES: 193 mg/dL — AB (ref 0–149)
VLDL Cholesterol Cal: 39 mg/dL (ref 5–40)

## 2018-03-11 LAB — TSH: TSH: 4.06 u[IU]/mL (ref 0.450–4.500)

## 2018-04-18 ENCOUNTER — Other Ambulatory Visit: Payer: Self-pay | Admitting: Cardiovascular Disease

## 2018-04-18 NOTE — Telephone Encounter (Signed)
Rx sent to pharmacy   

## 2018-06-13 ENCOUNTER — Ambulatory Visit (INDEPENDENT_AMBULATORY_CARE_PROVIDER_SITE_OTHER): Payer: Medicare HMO | Admitting: Family Medicine

## 2018-06-13 ENCOUNTER — Encounter: Payer: Self-pay | Admitting: Family Medicine

## 2018-06-13 VITALS — BP 135/82 | HR 70 | Temp 97.1°F | Ht 71.0 in | Wt 173.0 lb

## 2018-06-13 DIAGNOSIS — I2510 Atherosclerotic heart disease of native coronary artery without angina pectoris: Secondary | ICD-10-CM | POA: Diagnosis not present

## 2018-06-13 DIAGNOSIS — E785 Hyperlipidemia, unspecified: Secondary | ICD-10-CM

## 2018-06-13 DIAGNOSIS — Z23 Encounter for immunization: Secondary | ICD-10-CM | POA: Diagnosis not present

## 2018-06-13 DIAGNOSIS — Z1211 Encounter for screening for malignant neoplasm of colon: Secondary | ICD-10-CM | POA: Diagnosis not present

## 2018-06-13 DIAGNOSIS — J449 Chronic obstructive pulmonary disease, unspecified: Secondary | ICD-10-CM | POA: Diagnosis not present

## 2018-06-13 LAB — CMP14+EGFR
ALT: 23 IU/L (ref 0–44)
AST: 27 IU/L (ref 0–40)
Albumin/Globulin Ratio: 1.8 (ref 1.2–2.2)
Albumin: 4.3 g/dL (ref 3.5–4.8)
Alkaline Phosphatase: 74 IU/L (ref 39–117)
BUN/Creatinine Ratio: 14 (ref 10–24)
BUN: 13 mg/dL (ref 8–27)
Bilirubin Total: 0.9 mg/dL (ref 0.0–1.2)
CHLORIDE: 102 mmol/L (ref 96–106)
CO2: 26 mmol/L (ref 20–29)
CREATININE: 0.93 mg/dL (ref 0.76–1.27)
Calcium: 9.4 mg/dL (ref 8.6–10.2)
GFR calc Af Amer: 95 mL/min/{1.73_m2} (ref >59)
GFR calc non Af Amer: 82 mL/min/{1.73_m2} (ref >59)
GLUCOSE: 79 mg/dL (ref 65–99)
Globulin, Total: 2.4 g/dL (ref 1.5–4.5)
Potassium: 4.2 mmol/L (ref 3.5–5.2)
SODIUM: 143 mmol/L (ref 134–144)
Total Protein: 6.7 g/dL (ref 6.0–8.5)

## 2018-06-13 LAB — CBC WITH DIFFERENTIAL/PLATELET
BASOS ABS: 0.1 10*3/uL (ref 0.0–0.2)
Basos: 1 %
EOS (ABSOLUTE): 0.2 10*3/uL (ref 0.0–0.4)
Eos: 3 %
Hematocrit: 43 % (ref 37.5–51.0)
Hemoglobin: 14.6 g/dL (ref 13.0–17.7)
Immature Grans (Abs): 0 10*3/uL (ref 0.0–0.1)
Immature Granulocytes: 0 %
LYMPHS ABS: 1.8 10*3/uL (ref 0.7–3.1)
Lymphs: 29 %
MCH: 30.3 pg (ref 26.6–33.0)
MCHC: 34 g/dL (ref 31.5–35.7)
MCV: 89 fL (ref 79–97)
MONOS ABS: 0.6 10*3/uL (ref 0.1–0.9)
Monocytes: 10 %
Neutrophils Absolute: 3.4 10*3/uL (ref 1.4–7.0)
Neutrophils: 57 %
Platelets: 229 10*3/uL (ref 150–450)
RBC: 4.82 x10E6/uL (ref 4.14–5.80)
RDW: 13.7 % (ref 12.3–15.4)
WBC: 6 10*3/uL (ref 3.4–10.8)

## 2018-06-13 LAB — LIPID PANEL
CHOLESTEROL TOTAL: 134 mg/dL (ref 100–199)
Chol/HDL Ratio: 3.1 ratio (ref 0.0–5.0)
HDL: 43 mg/dL (ref >39)
LDL CALC: 59 mg/dL (ref 0–99)
TRIGLYCERIDES: 162 mg/dL — AB (ref 0–149)
VLDL CHOLESTEROL CAL: 32 mg/dL (ref 5–40)

## 2018-06-13 MED ORDER — CLOPIDOGREL BISULFATE 75 MG PO TABS: 75.0000 mg | ORAL_TABLET | Freq: Every day | ORAL | 1 refills | Status: DC

## 2018-06-13 MED ORDER — FLUTICASONE-SALMETEROL 100-50 MCG/DOSE IN AEPB: 1.0000 | INHALATION_SPRAY | Freq: Two times a day (BID) | RESPIRATORY_TRACT | 5 refills | Status: DC

## 2018-06-13 MED ORDER — CARVEDILOL 3.125 MG PO TABS: 3.1250 mg | ORAL_TABLET | Freq: Two times a day (BID) | ORAL | 5 refills | Status: DC

## 2018-06-13 MED ORDER — ATORVASTATIN CALCIUM 80 MG PO TABS: ORAL_TABLET | ORAL | 1 refills | Status: DC

## 2018-06-25 ENCOUNTER — Encounter: Payer: Self-pay | Admitting: Family Medicine

## 2018-06-25 NOTE — Progress Notes (Signed)
Subjective:  Patient ID: Cory Sloan, male    DOB: 01-15-1946  Age: 72 y.o. MRN: [id number]  CC: Medical Management of Chronic Issues   HPI Cory Sloan presents for patient in for follow-up of elevated cholesterol. Doing well without complaints on current medication. Denies side effects of statin including myalgia and arthralgia and nausea. Also in today for liver function testing. Currently no chest pain, shortness of breath or other cardiovascular related symptoms noted.  Patient is under follow-up for coronary artery disease.  He has been managed with platelet inhibition with Plavix and aspirin, atorvastatin for cholesterol, and carvedilol.  He reports some minor dyspnea on exertion occasionally.  Depression screen [facility] 2/9 06/13/2018 03/10/2018  Decreased Interest 0 1  Down, Depressed, Hopeless 0 1  PHQ - 2 Score 0 2  Altered sleeping - 1  Tired, decreased energy - 2  Change in appetite - 2  Feeling bad or failure about yourself  - 2  Trouble concentrating - 0  Moving slowly or fidgety/restless - 1  Suicidal thoughts - 0  PHQ-9 Score - 10    History Cory Sloan has a past medical history of Carotid artery stenosis, COPD (chronic obstructive pulmonary disease) ([place]), HLD (hyperlipidemia), ST elevation (STEMI) myocardial infarction involving right coronary artery ([place]) (10/04/2015), STEMI (ST elevation myocardial infarction) 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   He has a past surgical history that includes Appendectomy; Throat surgery (2005); Cardiac catheterization (N/A, 10/04/2015); and Cardiac catheterization (N/A, 10/04/2015).   His family history includes Brain cancer in his father; CAD in his brother; Cancer - Other in his mother.He reports that he quit smoking about 14 years ago. His smoking use included cigarettes. He has a 60.00 pack-year smoking history. He has never used smokeless tobacco. He reports that he does not drink alcohol or use  drugs.    ROS Review of Systems  Constitutional: Negative.   HENT: Negative.   Eyes: Negative for visual disturbance.  Respiratory: Negative for cough and shortness of breath.   Cardiovascular: Negative for chest pain and leg swelling.  Gastrointestinal: Negative for abdominal pain, diarrhea, nausea and vomiting.  Genitourinary: Negative for difficulty urinating.  Musculoskeletal: Negative for arthralgias and myalgias.  Skin: Negative for rash.  Neurological: Negative for headaches.  Psychiatric/Behavioral: Negative for sleep disturbance.    Objective:  BP 135/82   Pulse 70   Temp (!) 97.1 F (36.2 C) (Oral)   Ht '5\' 11"'$  (1.803 m)   Wt 173 lb (78.5 kg)   BMI 24.13 kg/m   BP Readings from Last 3 Encounters:  06/13/18 135/82  03/10/18 (!) 98/59  09/18/17 106/68    Wt Readings from Last 3 Encounters:  06/13/18 173 lb (78.5 kg)  03/10/18 170 lb 4 oz (77.2 kg)  04/04/17 171 lb (77.6 kg)     Physical Exam  Constitutional: He is oriented to person, place, and time. He appears well-developed and well-nourished. No distress.  HENT:  Head: Normocephalic and atraumatic.  Right Ear: External ear normal.  Left Ear: External ear normal.  Nose: Nose normal.  Mouth/Throat: Oropharynx is clear and moist.  Eyes: Pupils are equal, round, and reactive to light. Conjunctivae and EOM are normal.  Neck: Normal range of motion. Neck supple.  Cardiovascular: Normal rate, regular rhythm and normal heart sounds.  No murmur heard. Pulmonary/Chest: Effort normal and breath sounds normal. No respiratory distress. He has no wheezes. He has no rales.  Abdominal: Soft. There is no tenderness.  Musculoskeletal: Normal range of motion.  Neurological: He is alert and oriented to person, place, and time. He has normal reflexes.  Skin: Skin is warm and dry.  Psychiatric: He has a normal mood and affect. His behavior is normal. Judgment and thought content normal.      Assessment & Plan:    Cory Sloan was seen today for medical management of chronic issues.  Diagnoses and all orders for this visit:  Hyperlipidemia LDL goal <70 -     CBC with Differential/Platelet -     CMP14+EGFR -     Lipid panel  Colon cancer screening -     Ambulatory referral to Gastroenterology  Coronary artery disease involving native coronary artery of native heart without angina pectoris  Chronic obstructive pulmonary disease, unspecified COPD type ([place])  Other orders -     Fluticasone-Salmeterol (ADVAIR) 100-50 MCG/DOSE AEPB; Inhale 1 puff into the lungs 2 (two) times daily. -     clopidogrel (PLAVIX) 75 MG tablet; Take 1 tablet (75 mg total) by mouth daily. -     carvedilol (COREG) 3.125 MG tablet; Take 1 tablet (3.125 mg total) by mouth 2 (two) times daily with a meal. Please call to make follow up appointment for further refills. -     atorvastatin (LIPITOR) 80 MG tablet; TAKE 1 TABLET (80 MG TOTAL) BY MOUTH DAILY AT 6 PM. -     Pneumococcal conjugate vaccine 13-valent       I am having Cory Sloan start on Fluticasone-Salmeterol. I am also having him maintain his aspirin, nitroGLYCERIN, albuterol, clopidogrel, carvedilol, and atorvastatin.  Allergies as of 06/13/2018   No Known Allergies     Medication List        Accurate as of 06/13/18 11:59 PM. Always use your most recent med list.          albuterol 108 (90 Base) MCG/ACT inhaler Commonly known as:  PROVENTIL HFA;VENTOLIN HFA Inhale 1-2 puffs into the lungs every 4 (four) hours as needed for wheezing or shortness of breath (or coughing).   aspirin 81 MG EC tablet Take 1 tablet (81 mg total) by mouth daily.   atorvastatin 80 MG tablet Commonly known as:  LIPITOR TAKE 1 TABLET (80 MG TOTAL) BY MOUTH DAILY AT 6 PM.   carvedilol 3.125 MG tablet Commonly known as:  COREG Take 1 tablet (3.125 mg total) by mouth 2 (two) times daily with a meal. Please call to make follow up appointment for further refills.    clopidogrel 75 MG tablet Commonly known as:  PLAVIX Take 1 tablet (75 mg total) by mouth daily.   Fluticasone-Salmeterol 100-50 MCG/DOSE Aepb Commonly known as:  ADVAIR Inhale 1 puff into the lungs 2 (two) times daily.   nitroGLYCERIN 0.4 MG SL tablet Commonly known as:  NITROSTAT Place 1 tablet (0.4 mg total) under the tongue every 5 (five) minutes as needed for chest pain (CP or SOB).      Continue current medication regimen.  Report chest pain should occur particularly episodic or lasting over 15 minutes. Follow-up: Return in about 6 months (around 12/14/2018).  Claretta Fraise, M.D.

## 2018-09-04 ENCOUNTER — Other Ambulatory Visit: Payer: Self-pay | Admitting: Cardiovascular Disease

## 2018-12-16 ENCOUNTER — Encounter: Payer: Self-pay | Admitting: Family Medicine

## 2018-12-16 ENCOUNTER — Ambulatory Visit (INDEPENDENT_AMBULATORY_CARE_PROVIDER_SITE_OTHER): Payer: Medicare HMO | Admitting: Family Medicine

## 2018-12-16 VITALS — BP 129/82 | HR 57 | Temp 97.4°F | Ht 71.0 in | Wt 171.0 lb

## 2018-12-16 DIAGNOSIS — J329 Chronic sinusitis, unspecified: Secondary | ICD-10-CM

## 2018-12-16 DIAGNOSIS — J40 Bronchitis, not specified as acute or chronic: Secondary | ICD-10-CM

## 2018-12-16 DIAGNOSIS — E785 Hyperlipidemia, unspecified: Secondary | ICD-10-CM | POA: Diagnosis not present

## 2018-12-16 DIAGNOSIS — J449 Chronic obstructive pulmonary disease, unspecified: Secondary | ICD-10-CM | POA: Diagnosis not present

## 2018-12-16 DIAGNOSIS — R7303 Prediabetes: Secondary | ICD-10-CM

## 2018-12-16 LAB — BAYER DCA HB A1C WAIVED: HB A1C (BAYER DCA - WAIVED): 5.7 % (ref <7.0)

## 2018-12-16 MED ORDER — STERILE SALINE SOLN: 10 refills | Status: AC

## 2018-12-16 MED ORDER — ATORVASTATIN CALCIUM 80 MG PO TABS: ORAL_TABLET | ORAL | 1 refills | Status: DC

## 2018-12-16 MED ORDER — FLUTICASONE FUROATE-VILANTEROL 200-25 MCG/INH IN AEPB: 1.0000 | INHALATION_SPRAY | Freq: Every day | RESPIRATORY_TRACT | 11 refills | Status: DC

## 2018-12-16 MED ORDER — ALBUTEROL SULFATE HFA 108 (90 BASE) MCG/ACT IN AERS: 1.0000 | INHALATION_SPRAY | RESPIRATORY_TRACT | 5 refills | Status: DC | PRN

## 2018-12-16 MED ORDER — GUAIFENESIN-CODEINE 100-10 MG/5ML PO SYRP: 5.0000 mL | ORAL_SOLUTION | ORAL | 0 refills | Status: DC | PRN

## 2018-12-16 MED ORDER — CLOPIDOGREL BISULFATE 75 MG PO TABS: 75.0000 mg | ORAL_TABLET | Freq: Every day | ORAL | 1 refills | Status: DC

## 2018-12-16 MED ORDER — AZITHROMYCIN 250 MG PO TABS: ORAL_TABLET | ORAL | 0 refills | Status: DC

## 2018-12-16 NOTE — Progress Notes (Signed)
Hello Callin,  Your lab result is normal.Some minor variations that are not significant are commonly marked abnormal, but do not represent any medical problem for you.  Best regards, Riaz Onorato, M.D.

## 2018-12-16 NOTE — Progress Notes (Signed)
Chief Complaint  Patient presents with  . Medical Management of Chronic Issues    HPI  Patient presents today for Patient presents with upper respiratory congestion. Patient reports coughing frequently as well.  yellow sputum noted. There is no fever, chills or sweats. The patient denies being short of breath. Onset was 2 weeks ago. Gradually worsening. Tried OTCs without improvement.  Patient denies any shortness of breath recently.  He continues to use his albuterol regularly.  However he does not tolerate the Advair because he has to use it through his trach and it burns his throat.  He would like to have some saline wash for him to scored into the trach to help with that as well.  PMH: Smoking status noted ROS: Review of Systems  Constitutional: Negative for activity change, appetite change, chills and fever.  HENT: Positive for congestion, postnasal drip, rhinorrhea and sinus pressure. Negative for ear discharge, ear pain, hearing loss, nosebleeds, sneezing and trouble swallowing.   Respiratory: Positive for cough. Negative for chest tightness and shortness of breath.   Cardiovascular: Negative for chest pain and palpitations.  Musculoskeletal: Negative for arthralgias.  Skin: Negative for rash.   ag  Objective: BP 129/82   Pulse (!) 57   Temp (!) 97.4 F (36.3 C) (Oral)   Ht '5\' 11"'$  (1.803 m)   Wt 171 lb (77.6 kg)   BMI 23.85 kg/m  Gen: NAD, alert, cooperative with exam HEENT: NCAT, Nasal passages swollen, red TMS RED CV: RRR, good S1/S2, no murmur Resp: Bronchitis changes with scattered wheezes, non-labored Ext: No edema, warm Neuro: Alert and oriented, No gross deficits  Assessment and plan:  1. Sinobronchitis   2. Hyperlipidemia LDL goal <70   3. Prediabetes   4. Chronic obstructive pulmonary disease, unspecified COPD type ([place])     Meds ordered this encounter  Medications  . albuterol (PROVENTIL HFA;VENTOLIN HFA) 108 (90 Base) MCG/ACT inhaler    Sig: Inhale  1-2 puffs into the lungs every 4 (four) hours as needed for wheezing or shortness of breath (or coughing).    Dispense:  1 Inhaler    Refill:  5  . atorvastatin (LIPITOR) 80 MG tablet    Sig: TAKE 1 TABLET (80 MG TOTAL) BY MOUTH DAILY AT 6 PM.    Dispense:  90 tablet    Refill:  1  . clopidogrel (PLAVIX) 75 MG tablet    Sig: Take 1 tablet (75 mg total) by mouth daily.    Dispense:  90 tablet    Refill:  1  . azithromycin (ZITHROMAX Z-PAK) 250 MG tablet    Sig: Take two right away Then one a day for the next 4 days.    Dispense:  6 each    Refill:  0  . guaiFENesin-codeine (CHERATUSSIN AC) 100-10 MG/5ML syrup    Sig: Take 5 mLs by mouth every 4 (four) hours as needed for cough.    Dispense:  180 mL    Refill:  0  . fluticasone furoate-vilanterol (BREO ELLIPTA) 200-25 MCG/INH AEPB    Sig: Inhale 1 puff into the lungs daily.    Dispense:  1 each    Refill:  11  . Soft Lens Products (STERILE SALINE) SOLN    Sig: Use saily to flush trach after use of Inhaler (Breo)    Dispense:  100 mL    Refill:  10    Pt. Needs saline that he ccan squirt into the tracheostomy to moisturize and also to avoid fungal/bacterial  overgrowth in the trachea from use of inhaled corticosteroid. Thanks, WS    Orders Placed This Encounter  Procedures  . CBC with Differential/Platelet  . CMP14+EGFR  . Lipid panel  . Bayer DCA Hb A1c Waived    Follow up as needed.  Claretta Fraise, MD

## 2018-12-17 LAB — CBC WITH DIFFERENTIAL/PLATELET
Basophils Absolute: 0.1 10*3/uL (ref 0.0–0.2)
Basos: 1 %
EOS (ABSOLUTE): 0.3 10*3/uL (ref 0.0–0.4)
EOS: 3 %
HEMATOCRIT: 40.9 % (ref 37.5–51.0)
Hemoglobin: 13.9 g/dL (ref 13.0–17.7)
Immature Grans (Abs): 0 10*3/uL (ref 0.0–0.1)
Immature Granulocytes: 0 %
Lymphocytes Absolute: 2.1 10*3/uL (ref 0.7–3.1)
Lymphs: 29 %
MCH: 30.1 pg (ref 26.6–33.0)
MCHC: 34 g/dL (ref 31.5–35.7)
MCV: 89 fL (ref 79–97)
MONOS ABS: 0.7 10*3/uL (ref 0.1–0.9)
Monocytes: 9 %
NEUTROS ABS: 4.3 10*3/uL (ref 1.4–7.0)
Neutrophils: 58 %
PLATELETS: 279 10*3/uL (ref 150–450)
RBC: 4.62 x10E6/uL (ref 4.14–5.80)
RDW: 12.7 % (ref 11.6–15.4)
WBC: 7.3 10*3/uL (ref 3.4–10.8)

## 2018-12-17 LAB — CMP14+EGFR
A/G RATIO: 1.6 (ref 1.2–2.2)
ALK PHOS: 81 IU/L (ref 39–117)
ALT: 18 IU/L (ref 0–44)
AST: 20 IU/L (ref 0–40)
Albumin: 4.1 g/dL (ref 3.7–4.7)
BUN/Creatinine Ratio: 13 (ref 10–24)
BUN: 12 mg/dL (ref 8–27)
Bilirubin Total: 0.6 mg/dL (ref 0.0–1.2)
CO2: 22 mmol/L (ref 20–29)
Calcium: 9.4 mg/dL (ref 8.6–10.2)
Chloride: 99 mmol/L (ref 96–106)
Creatinine, Ser: 0.92 mg/dL (ref 0.76–1.27)
GFR calc Af Amer: 96 mL/min/{1.73_m2} (ref >59)
GFR calc non Af Amer: 83 mL/min/{1.73_m2} (ref >59)
GLOBULIN, TOTAL: 2.6 g/dL (ref 1.5–4.5)
Glucose: 88 mg/dL (ref 65–99)
POTASSIUM: 4.5 mmol/L (ref 3.5–5.2)
SODIUM: 138 mmol/L (ref 134–144)
TOTAL PROTEIN: 6.7 g/dL (ref 6.0–8.5)

## 2018-12-17 LAB — LIPID PANEL
CHOLESTEROL TOTAL: 147 mg/dL (ref 100–199)
Chol/HDL Ratio: 2.9 ratio (ref 0.0–5.0)
HDL: 50 mg/dL (ref >39)
LDL Calculated: 69 mg/dL (ref 0–99)
TRIGLYCERIDES: 141 mg/dL (ref 0–149)
VLDL Cholesterol Cal: 28 mg/dL (ref 5–40)

## 2018-12-17 NOTE — Progress Notes (Signed)
Hello Cory Sloan,  Your lab result is normal.Some minor variations that are not significant are commonly marked abnormal, but do not represent any medical problem for you.  Best regards, Julliana Whitmyer, M.D.

## 2019-01-09 ENCOUNTER — Encounter: Payer: Self-pay | Admitting: *Deleted

## 2019-01-15 ENCOUNTER — Other Ambulatory Visit: Payer: Self-pay | Admitting: Cardiovascular Disease

## 2019-02-15 ENCOUNTER — Other Ambulatory Visit: Payer: Self-pay | Admitting: Cardiovascular Disease

## 2019-03-19 ENCOUNTER — Other Ambulatory Visit: Payer: Self-pay | Admitting: Cardiovascular Disease

## 2019-03-19 NOTE — Telephone Encounter (Signed)
Carvedilol refilled. Message sent to schedulers to make appointment.

## 2019-03-20 ENCOUNTER — Telehealth: Payer: Self-pay | Admitting: Cardiovascular Disease

## 2019-03-20 NOTE — Telephone Encounter (Signed)
Left message for patient to call and schedule telehealth visit with Dr. [name]

## 2019-03-25 ENCOUNTER — Telehealth: Payer: Self-pay | Admitting: Cardiovascular Disease

## 2019-03-25 NOTE — Telephone Encounter (Signed)
Spoke to pt and pt wife who both stated the only problem he has been having is with his sugar levels, which is being handled by PCP. Pt and pt wife both agreed to reschedule appt with Dr. [name] to September due to needing face-to-face appt. Pt rescheduled to 08/05/19 at 1:40 PM.

## 2019-04-01 ENCOUNTER — Telehealth: Payer: Medicare HMO | Admitting: Cardiovascular Disease

## 2019-04-19 ENCOUNTER — Other Ambulatory Visit: Payer: Self-pay | Admitting: Cardiovascular Disease

## 2019-05-24 ENCOUNTER — Other Ambulatory Visit: Payer: Self-pay | Admitting: Cardiovascular Disease

## 2019-06-16 ENCOUNTER — Ambulatory Visit: Payer: Medicare HMO | Admitting: Family Medicine

## 2019-06-22 ENCOUNTER — Other Ambulatory Visit: Payer: Self-pay | Admitting: Family Medicine

## 2019-06-22 NOTE — Telephone Encounter (Signed)
Has appointment 06/24/2019.

## 2019-06-24 ENCOUNTER — Ambulatory Visit: Payer: Medicare HMO | Admitting: Family Medicine

## 2019-06-24 ENCOUNTER — Other Ambulatory Visit: Payer: Self-pay | Admitting: Cardiovascular Disease

## 2019-06-25 ENCOUNTER — Encounter: Payer: Self-pay | Admitting: Family Medicine

## 2019-07-17 ENCOUNTER — Other Ambulatory Visit: Payer: Self-pay | Admitting: Cardiovascular Disease

## 2019-08-01 ENCOUNTER — Other Ambulatory Visit: Payer: Self-pay | Admitting: Cardiovascular Disease

## 2019-08-05 ENCOUNTER — Telehealth: Payer: Self-pay

## 2019-08-05 ENCOUNTER — Telehealth: Payer: Medicare HMO | Admitting: Cardiovascular Disease

## 2019-08-05 NOTE — Telephone Encounter (Signed)
Called patient to prechart 1:40 before virtual visit. Left message for patient to call office back

## 2019-08-05 NOTE — Telephone Encounter (Signed)
Left message for patient to call the office back for his virtual visit with Dr. [name] at 1:40

## 2019-08-31 ENCOUNTER — Other Ambulatory Visit: Payer: Self-pay | Admitting: Cardiovascular Disease

## 2019-08-31 ENCOUNTER — Other Ambulatory Visit: Payer: Self-pay | Admitting: Family Medicine

## 2019-09-03 ENCOUNTER — Other Ambulatory Visit: Payer: Self-pay | Admitting: Family Medicine

## 2019-09-04 ENCOUNTER — Telehealth: Payer: Self-pay | Admitting: Cardiovascular Disease

## 2019-09-04 NOTE — Telephone Encounter (Signed)
°*  STAT* If patient is at the pharmacy, call can be transferred to refill team.   1. Which medications need to be refilled? (please list name of each medication and dose if known)   atorvastatin (LIPITOR) 80 MG tablet  2. Which pharmacy/location (including street and city if local pharmacy) is medication to be sent to?  [id number] - [place], [place] - [address]  3. Do they need a 30 day or 90 day supply? [id number]  Pharmacy wanted pt to have an appt with Dr. [name], but the pt  had a telemedicine visit with Dr. [name] on 09/16.

## 2019-10-02 ENCOUNTER — Other Ambulatory Visit: Payer: Self-pay | Admitting: Family Medicine

## 2019-10-05 ENCOUNTER — Other Ambulatory Visit: Payer: Self-pay

## 2019-10-07 ENCOUNTER — Encounter: Payer: Self-pay | Admitting: Family Medicine

## 2019-10-07 ENCOUNTER — Other Ambulatory Visit: Payer: Self-pay

## 2019-10-07 ENCOUNTER — Ambulatory Visit (INDEPENDENT_AMBULATORY_CARE_PROVIDER_SITE_OTHER): Payer: Medicare HMO | Admitting: Family Medicine

## 2019-10-07 VITALS — BP 140/79 | HR 65 | Temp 99.6°F | Resp 20 | Ht 71.0 in | Wt 169.0 lb

## 2019-10-07 DIAGNOSIS — E785 Hyperlipidemia, unspecified: Secondary | ICD-10-CM | POA: Diagnosis not present

## 2019-10-07 DIAGNOSIS — R195 Other fecal abnormalities: Secondary | ICD-10-CM

## 2019-10-07 MED ORDER — ATORVASTATIN CALCIUM 80 MG PO TABS: ORAL_TABLET | ORAL | 1 refills | Status: DC

## 2019-10-07 NOTE — Progress Notes (Signed)
Chief Complaint  Patient presents with  . Discuss abnormal FIT test    HPI  Patient presents today for positive FIT. Has never had a colonoscopy. Denies constipation diarrhea, No nausea or vomiting. Test was a routine check.   in for follow-up of elevated cholesterol. Doing well without complaints on current medication. Denies side effects of statin including myalgia and arthralgia and nausea. Currently no chest pain, shortness of breath or other cardiovascular related symptoms noted.  PMH: Smoking status noted ROS: Per HPI  Objective: BP 140/79   Pulse 65   Temp 99.6 F (37.6 C) (Temporal)   Resp 20   Ht 5\' 11"  (1.803 m)   Wt 169 lb (76.7 kg)   SpO2 96%   BMI 23.57 kg/m  Gen: NAD, alert, cooperative with exam HEENT: NCAT, EOMI, PERRL CV: RRR, good S1/S2, no murmur Resp: CTABL, no wheezes, non-labored Abd: SNTND, BS present, no guarding or organomegaly Ext: No edema, warm Neuro: Alert and oriented, No gross deficits  Assessment and plan:  1. Occult blood in stools   2. Hyperlipidemia LDL goal <70     Meds ordered this encounter  Medications  . atorvastatin (LIPITOR) 80 MG tablet    Sig: TAKE 1 TABLET (80 MG TOTAL) BY MOUTH DAILY AT 6 PM. (Needs to be seen before next refill)    Dispense:  90 tablet    Refill:  1    Orders Placed This Encounter  Procedures  . Colonoscopy    Referral Priority:   Routine    Referral Type:   Consultation    Referral Reason:   Specialty Services Required    Number of Visits Requested:   1    Follow up as needed.  Claretta Fraise, MD

## 2019-10-26 ENCOUNTER — Encounter: Payer: Self-pay | Admitting: Physician Assistant

## 2019-10-28 ENCOUNTER — Other Ambulatory Visit: Payer: Self-pay

## 2019-10-28 DIAGNOSIS — Z20822 Contact with and (suspected) exposure to covid-19: Secondary | ICD-10-CM

## 2019-10-29 ENCOUNTER — Telehealth: Payer: Self-pay | Admitting: Unknown Physician Specialty

## 2019-10-29 LAB — NOVEL CORONAVIRUS, NAA: SARS-CoV-2, NAA: DETECTED — AB

## 2019-10-29 NOTE — Telephone Encounter (Signed)
Discussed with patient about Covid symptoms and the use of bamlanivimab, a monoclonal antibody infusion for those with mild to moderate Covid symptoms and at a high risk of hospitalization.    Pt is asymptomatic and does not qualify for infusion

## 2019-11-06 ENCOUNTER — Ambulatory Visit: Payer: Medicare HMO | Admitting: Physician Assistant

## 2019-11-18 ENCOUNTER — Ambulatory Visit: Payer: Medicare HMO | Admitting: Family Medicine

## 2019-11-23 ENCOUNTER — Telehealth: Payer: Self-pay

## 2019-11-23 ENCOUNTER — Encounter: Payer: Self-pay | Admitting: Physician Assistant

## 2019-11-23 ENCOUNTER — Ambulatory Visit: Payer: Medicare HMO | Admitting: Physician Assistant

## 2019-11-23 VITALS — BP 132/86 | HR 75 | Temp 98.2°F | Ht 71.0 in | Wt 166.5 lb

## 2019-11-23 DIAGNOSIS — Z8521 Personal history of malignant neoplasm of larynx: Secondary | ICD-10-CM

## 2019-11-23 DIAGNOSIS — J449 Chronic obstructive pulmonary disease, unspecified: Secondary | ICD-10-CM | POA: Diagnosis not present

## 2019-11-23 DIAGNOSIS — R195 Other fecal abnormalities: Secondary | ICD-10-CM

## 2019-11-23 DIAGNOSIS — I700 Atherosclerosis of aorta: Secondary | ICD-10-CM

## 2019-11-23 MED ORDER — NA SULFATE-K SULFATE-MG SULF 17.5-3.13-1.6 GM/177ML PO SOLN: ORAL | 0 refills | Status: DC

## 2019-11-23 NOTE — Patient Instructions (Signed)
If you are age 73 or older, your body mass index should be between 23-30. Your Body mass index is 23.22 kg/m. If this is out of the aforementioned range listed, please consider follow up with your Primary Care Provider.  If you are age 77 or younger, your body mass index should be between 19-25. Your Body mass index is 23.22 kg/m. If this is out of the aformentioned range listed, please consider follow up with your Primary Care Provider.   You have been scheduled for a colonoscopy. Please follow written instructions given to you at your visit today.  Please pick up your prep supplies at the pharmacy within the next 1-3 days. If you use inhalers (even only as needed), please bring them with you on the day of your procedure. Your physician has requested that you go to www.startemmi.com and enter the access code given to you at your visit today. This web site gives a general overview about your procedure. However, you should still follow specific instructions given to you by our office regarding your preparation for the procedure.  We have sent the following medications to your pharmacy for you to pick up at your convenience: [place] will be contacted by our office prior to your procedure for directions on holding your Plavix.  If you do not hear from our office 1 week prior to your scheduled procedure, please call [phone] to discuss.    Thank you for choosing me and [facility].   Amy Esterwood, PA-C

## 2019-11-23 NOTE — Telephone Encounter (Signed)
Patient schedule to see Cory Sloan 01/11 @ 8:30 am, pt is aware of appt and time

## 2019-11-23 NOTE — Telephone Encounter (Signed)
Primary Cardiologist: Dr. [name]  Chart reviewed as part of pre-operative protocol coverage. Because of Cory Sloan past medical history and time since last visit, he/she will require a follow-up visit in order to better assess preoperative cardiovascular risk. He has not been seen since 2018 by cardiology   Pre-op covering staff: - Please schedule appointment and call patient to inform them. - Please contact requesting surgeon's office via preferred method (i.e, phone, fax) to inform them of need for appointment prior to surgery.  If applicable, this message will also be routed to pharmacy pool and/or primary cardiologist for input on holding anticoagulant/antiplatelet agent as requested below so that this information is available at time of patient's appointment.   [name], NP  11/23/2019, 3:22 PM

## 2019-11-23 NOTE — Telephone Encounter (Signed)
[facility] Pre-operative Risk Assessment     Request for surgical clearance:     Endoscopy Procedure  What type of surgery is being performed?     Colonoscopy  When is this surgery scheduled?     12/21/19  What type of clearance is required ?   Pharmacy  Are there any medications that need to be held prior to surgery and how long? HOLD PLAVIX FIVE DAYS PRIOR  Practice name and name of physician performing surgery?      [facility]/Dr. Bryan Lemma What is your office phone and fax number?      Phone- [phone]  Fax- [phone] Attn: Peter Congo, RMA  Anesthesia type (None, local, MAC, general) ?       MAC Attn: Shelva Majestic MD

## 2019-11-23 NOTE — Progress Notes (Signed)
Subjective:    Patient ID: Cory Sloan, male    DOB: 1946-08-09, 74 y.o.   MRN: [id number]  HPI Cory Sloan is a pleasant 74 year old white male, new to GI today referred by Dr. Claretta Fraise for positive FIT test. Patient has not had prior colonoscopy or other GI evaluation. He says he was sent the fit test through his insurance company. Patient has history of coronary artery disease status post stent approximately 4 years ago and is followed by Dr. [name].  He has been maintained on aspirin and Plavix. Also with COPD, no O2 use.  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  Also with hyperlipidemia and a thoracic aneurysm which is being followed.  Patient has no current GI symptoms, specifically denies any melena or hematochezia.  He has no complaints of abdominal discomfort, no changes in bowel habits.  He has had some mild constipation issues but does not require laxatives.. No upper GI complaints, specifically denies dysphagia or odynophagia and no chronic GERD. Family history is pertinent for colon polyps in 2 brothers, no family history of colon cancer that he is aware of.  Patient was diagnosed with COVID-19 on 10/28/2019.  He did require a course of steroids and was given antibiotics.  He feels that he has recuperated though has some residual fatigue.  Review of Systems Pertinent positive and negative review of systems were noted in the above HPI section.  All other review of systems was otherwise negative.  Outpatient Encounter Medications as of 11/23/2019  Medication Sig  . albuterol (PROVENTIL HFA;VENTOLIN HFA) 108 (90 Base) MCG/ACT inhaler Inhale 1-2 puffs into the lungs every 4 (four) hours as needed for wheezing or shortness of breath (or coughing).  Marland Kitchen aspirin EC 81 MG EC tablet Take 1 tablet (81 mg total) by mouth daily.  Marland Kitchen atorvastatin (LIPITOR) 80 MG tablet TAKE 1 TABLET (80 MG TOTAL) BY MOUTH DAILY AT 6 PM. (Needs to be seen before next refill)    . benzonatate (TESSALON) 100 MG capsule Take 200 mg by mouth 3 (three) times daily as needed.  . carvedilol (COREG) 3.125 MG tablet TAKE 1&1/2 TABLETS BY MOUTH TWICE A DAY *NEED OFFICE VISIT*  . clopidogrel (PLAVIX) 75 MG tablet TAKE 1 TABLET BY MOUTH EVERY DAY  . doxycycline (VIBRA-TABS) 100 MG tablet Take 100 mg by mouth 2 (two) times daily.  . nitroGLYCERIN (NITROSTAT) 0.4 MG SL tablet Place 1 tablet (0.4 mg total) under the tongue every 5 (five) minutes as needed for chest pain (CP or SOB).  . Soft Lens Products (STERILE SALINE) SOLN Use saily to flush trach after use of Inhaler (Breo)  . Na Sulfate-K Sulfate-Mg Sulf 17.5-3.13-1.6 GM/177ML SOLN Suprep-Use as directed  . [DISCONTINUED] fluticasone furoate-vilanterol (BREO ELLIPTA) 200-25 MCG/INH AEPB Inhale 1 puff into the lungs daily.  . [DISCONTINUED] guaiFENesin-codeine (CHERATUSSIN AC) 100-10 MG/5ML syrup Take 5 mLs by mouth every 4 (four) hours as needed for cough.   No facility-administered encounter medications on file as of 11/23/2019.   No Known Allergies Patient Active Problem List   Diagnosis Date Noted  . History of laryngeal cancer 11/23/2019  . Carotid artery stenosis   . Abdominal aortic atherosclerosis ([place]) 03/11/2017  . Thoracic aortic aneurysm without rupture ([place]) 03/11/2017  . Chest pain, musculoskeletal 03/10/2017  . Coronary artery disease involving native coronary artery of native heart without angina pectoris   . Hyperlipidemia LDL goal <70 03/11/2016  . Chronic obstructive pulmonary disease (HCC)  Social History   Socioeconomic History  . Marital status: Married    Spouse name: Not on file  . Number of children: Not on file  . Years of education: Not on file  . Highest education level: Not on file  Occupational History  . Occupation: retired  Tobacco Use  . Smoking status: Former Smoker    Packs/day: 1.50    Years: 40.00    Pack years: 60.00    Types: Cigarettes    Quit date: 08/20/2003    Years  since quitting: 16.2  . Smokeless tobacco: Never Used  Substance and Sexual Activity  . Alcohol use: No    Alcohol/week: 0.0 standard drinks  . Drug use: No  . Sexual activity: Not Currently  Other Topics Concern  . Not on file  Social History Narrative  . Not on file   Social Determinants of Health   Financial Resource Strain:   . Difficulty of Paying Living Expenses: Not on file  Food Insecurity:   . Worried About Charity fundraiser in the Last Year: Not on file  . Ran Out of Food in the Last Year: Not on file  Transportation Needs:   . Lack of Transportation (Medical): Not on file  . Lack of Transportation (Non-Medical): Not on file  Physical Activity:   . Days of Exercise per Week: Not on file  . Minutes of Exercise per Session: Not on file  Stress:   . Feeling of Stress : Not on file  Social Connections:   . Frequency of Communication with Friends and Family: Not on file  . Frequency of Social Gatherings with Friends and Family: Not on file  . Attends Religious Services: Not on file  . Active Member of Clubs or Organizations: Not on file  . Attends Archivist Meetings: Not on file  . Marital Status: Not on file  Intimate Partner Violence:   . Fear of Current or Ex-Partner: Not on file  . Emotionally Abused: Not on file  . Physically Abused: Not on file  . Sexually Abused: Not on file    Cory Sloan family history includes Brain cancer in his father; CAD in his brother; Cancer - Other in his mother; Colon polyps in his brother.      Objective:    Vitals:   11/23/19 1419  BP: 132/86  Pulse: 75  Temp: 98.2 F (36.8 C)  SpO2: 97%    Physical Exam Well-developed well-nourished thin older male in no acute distress..  Accompanied by his wife, patient has a trach BMI 23.2  HEENT; nontraumatic normocephalic, EOMI, PER R LA, sclera anicteric. Oropharynx; not examined/mask/Covid Neck; supple, no JVD Cardiovascular; regular rate and rhythm with  S1-S2, no murmur rub or gallop Pulmonary; Clear bilaterally Abdomen; soft, nontender, nondistended, no palpable mass or hepatosplenomegaly, bowel sounds are active Rectal; not done Skin; benign exam, no jaundice rash or appreciable lesions Extremities; no clubbing cyanosis or edema skin warm and dry Neuro/Psych; alert and oriented x4, grossly nonfocal mood and affect appropriate       Assessment & Plan:   #43 74 year old white male with positive FIT test, asymptomatic, on Plavix and aspirin No prior colonoscopic evaluation. We will need to rule out occult neoplasm  #2 coronary artery disease status post stent approximately 4 years ago- #3 chronic antiplatelet therapy-Plavix and aspirin  #4 hyperlipidemia #5.  History of laryngeal CA-permanent trach #6 COPD #7 recent COVID-19 infection-tested + 10/28/2019-required a course of steroids, not currently symptomatic  Plan; Patient will be scheduled for Colonoscopy with Dr. Bryan Lemma..  Procedure was discussed in detail with patient including indications, risks, and benefits and he is agreeable to proceed. Procedure will need to be scheduled at the hospital given Tracheostomy  Patient will need to hold Plavix for 5 days prior to procedure.  We will communicate with his cardiologist Dr. [name] sure this is reasonable for this patient.  He can continue baby aspirin.[place] Nurse Tennile Styles PA-C 11/23/2019   Cc: Claretta Fraise, MD

## 2019-11-25 ENCOUNTER — Other Ambulatory Visit: Payer: Self-pay | Admitting: Family Medicine

## 2019-11-28 DIAGNOSIS — I10 Essential (primary) hypertension: Secondary | ICD-10-CM | POA: Diagnosis not present

## 2019-11-28 DIAGNOSIS — J449 Chronic obstructive pulmonary disease, unspecified: Secondary | ICD-10-CM | POA: Diagnosis not present

## 2019-11-28 DIAGNOSIS — I252 Old myocardial infarction: Secondary | ICD-10-CM | POA: Diagnosis not present

## 2019-11-28 DIAGNOSIS — Z008 Encounter for other general examination: Secondary | ICD-10-CM | POA: Diagnosis not present

## 2019-11-28 DIAGNOSIS — Z7902 Long term (current) use of antithrombotics/antiplatelets: Secondary | ICD-10-CM | POA: Diagnosis not present

## 2019-11-28 DIAGNOSIS — Z8249 Family history of ischemic heart disease and other diseases of the circulatory system: Secondary | ICD-10-CM | POA: Diagnosis not present

## 2019-11-28 DIAGNOSIS — E785 Hyperlipidemia, unspecified: Secondary | ICD-10-CM | POA: Diagnosis not present

## 2019-11-28 DIAGNOSIS — I2510 Atherosclerotic heart disease of native coronary artery without angina pectoris: Secondary | ICD-10-CM | POA: Diagnosis not present

## 2019-11-28 DIAGNOSIS — Z7982 Long term (current) use of aspirin: Secondary | ICD-10-CM | POA: Diagnosis not present

## 2019-11-28 DIAGNOSIS — Z8521 Personal history of malignant neoplasm of larynx: Secondary | ICD-10-CM | POA: Diagnosis not present

## 2019-11-28 DIAGNOSIS — I739 Peripheral vascular disease, unspecified: Secondary | ICD-10-CM | POA: Diagnosis not present

## 2019-11-30 ENCOUNTER — Encounter: Payer: Self-pay | Admitting: Physician Assistant

## 2019-11-30 ENCOUNTER — Other Ambulatory Visit: Payer: Self-pay

## 2019-11-30 ENCOUNTER — Ambulatory Visit: Payer: Medicare HMO | Admitting: Physician Assistant

## 2019-11-30 VITALS — BP 124/68 | HR 61 | Temp 97.3°F | Ht 71.0 in | Wt 165.8 lb

## 2019-11-30 DIAGNOSIS — J449 Chronic obstructive pulmonary disease, unspecified: Secondary | ICD-10-CM | POA: Diagnosis not present

## 2019-11-30 DIAGNOSIS — U071 COVID-19: Secondary | ICD-10-CM | POA: Diagnosis not present

## 2019-11-30 DIAGNOSIS — Z01810 Encounter for preprocedural cardiovascular examination: Secondary | ICD-10-CM | POA: Diagnosis not present

## 2019-11-30 DIAGNOSIS — I2510 Atherosclerotic heart disease of native coronary artery without angina pectoris: Secondary | ICD-10-CM | POA: Diagnosis not present

## 2019-11-30 DIAGNOSIS — I6523 Occlusion and stenosis of bilateral carotid arteries: Secondary | ICD-10-CM | POA: Diagnosis not present

## 2019-11-30 DIAGNOSIS — E785 Hyperlipidemia, unspecified: Secondary | ICD-10-CM

## 2019-11-30 MED ORDER — NITROGLYCERIN 0.4 MG SL SUBL: 0.4000 mg | SUBLINGUAL_TABLET | SUBLINGUAL | 12 refills | Status: DC | PRN

## 2019-11-30 MED ORDER — CLOPIDOGREL BISULFATE 75 MG PO TABS: 75.0000 mg | ORAL_TABLET | Freq: Every day | ORAL | 3 refills | Status: DC

## 2019-11-30 MED ORDER — CARVEDILOL 3.125 MG PO TABS: 3.1250 mg | ORAL_TABLET | Freq: Two times a day (BID) | ORAL | 3 refills | Status: DC

## 2019-11-30 MED ORDER — ATORVASTATIN CALCIUM 80 MG PO TABS: ORAL_TABLET | ORAL | 3 refills | Status: DC

## 2019-11-30 NOTE — Progress Notes (Signed)
Cardiology Office Note:    Date:  12/02/2019   ID:  [name], DOB [birth year], MRN [id number]  PCP:  Claretta Fraise, MD  Cardiologist:  Shelva Majestic, MD  Electrophysiologist:  None   Referring MD: Claretta Fraise, MD   Chief Complaint  Patient presents with   Follow-up    seen for Dr. [name].     History of Present Illness:    Cory Sloan is a 74 y.o. male with a hx of CAD, carotid artery disease, COPD, HLD, history of tobacco abuse and a throat cancer.  Patient had acute STEMI in November 2016 due to occlusion of large RCA.  He underwent successful DES with insertion of 4.0 x 32 mm Synergy DES postdilated to 4.6 mm.  EF was 50% with mild inferior hypokinesis.  Hospital course was complicated by cardiogenic shock and hypotension after cardiac catheterization.  CT of the chest during the hospitalization also showed emphysematous disease.  Echocardiogram in December 2016 showed EF 50 to 55% with akinesis of the basal inferior myocardium, grade 1 DD.  Initially he was placed on Brilinta however this was switched to Plavix due to shortness of breath issue.  P2Y12 study showed good platelet inhibition.  In April 2018, he presented back to the hospital with recurrent chest pain.  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  Vascular surgery reviewed the image and felt this does not represent dissection.  His chest pain was attributed to musculoskeletal issue.  Echocardiogram showed EF 55 to 60%, grade 1 DD.  Myoview obtained on 03/20/2017 was low risks showing EF of 46%.  Patient presents today for cardiology office visit.  He denies any recent exertional chest pain or shortness of breath.  The EKG showed nonspecific T wave changes however does appear to have more T wave inversion in the lateral leads.  He says he is able to do everything at home without any issue.  He  remains quite active.  He denies any lower extremity edema, orthopnea or PND.  Last lipid panel obtained in January 2020 showed very well-controlled cholesterol.  We discussed potential possibility of him repeating blood work at his PCPs office versus here in the cardiology office.  He preferred to get the lab work here.  We will proceed with fasting lipid panel and a complete metabolic panel.  Overall, patient seems to be quite doing quite well from cardiology perspective and can follow-up in 1 year.  Note, he may ended up needing a colonoscopy in the near future.  Given lack of anginal symptom, he is cleared to proceed with the procedure.  He may hold his Plavix for 5 to 7 days prior to the procedure and restart as soon as possible afterward.  Note, he continued to have diminished breath sounds in bilateral lung, this is likely residual from recent COVID-19 infection.  He is recovering quite well.  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   Tobacco  abuse     Past Surgical History:  Procedure Laterality Date   APPENDECTOMY     CARDIAC CATHETERIZATION N/A 10/04/2015   Procedure: Left Heart Cath and Coronary Angiography;  Surgeon: Troy Sine, MD;  Location: [facility];  Service: Cardiovascular;  Laterality: N/A;   CARDIAC CATHETERIZATION N/A 10/04/2015   Procedure: Coronary Stent Intervention;  Surgeon: Troy Sine, MD;  Location: [facility];  Service: Cardiovascular;  Laterality: N/A;   THROAT SURGERY  2005   [place]    Current  Medications: Current Meds  Medication Sig   albuterol (PROVENTIL HFA;VENTOLIN HFA) 108 (90 Base) MCG/ACT inhaler Inhale 1-2 puffs into the lungs every 4 (four) hours as needed for wheezing or shortness of breath (or coughing).   aspirin EC 81 MG EC tablet Take 1 tablet (81 mg total) by mouth daily.   atorvastatin (LIPITOR) 80 MG tablet TAKE 1 TABLET (80 MG TOTAL) BY MOUTH DAILY AT 6 PM. (Needs to be seen before next refill)   benzonatate (TESSALON) 100 MG capsule Take 200 mg by mouth 3 (three) times daily as needed.   carvedilol (COREG) 3.125 MG tablet Take 1 tablet (3.125 mg total) by mouth 2 (two) times daily with a meal. Take 1 tablet by mouth 2 times a day   clopidogrel (PLAVIX) 75 MG tablet Take 1 tablet (75 mg total) by mouth daily.   doxycycline (VIBRA-TABS) 100 MG tablet Take 100 mg by mouth 2 (two) times daily.   Na Sulfate-K Sulfate-Mg Sulf 17.5-3.13-1.6 GM/177ML SOLN Suprep-Use as directed   nitroGLYCERIN (NITROSTAT) 0.4 MG SL tablet Place 1 tablet (0.4 mg total) under the tongue every 5 (five) minutes as needed for chest pain (CP or SOB).   Soft Lens Products (STERILE SALINE) SOLN Use saily to flush trach after use of Inhaler (Breo)   [DISCONTINUED] atorvastatin (LIPITOR) 80 MG tablet TAKE 1 TABLET (80 MG TOTAL) BY MOUTH DAILY AT 6 PM. (Needs to be seen before next refill)   [DISCONTINUED] carvedilol (COREG) 3.125 MG tablet TAKE 1&1/2 TABLETS BY MOUTH TWICE A DAY *NEED OFFICE VISIT* (Patient taking differently: Take 3.125 mg by mouth 2 (two) times daily with a meal. Take 1 tablet by mouth 2 times a day)   [DISCONTINUED] carvedilol (COREG) 3.125 MG tablet Take 3.125 mg by mouth 2 (two) times daily with a meal. Take 1 tablet by mouth 2 times a day   [DISCONTINUED] clopidogrel (PLAVIX) 75 MG tablet Take 1 tablet (75 mg total) by mouth daily. (Needs to be seen before next refill)   [DISCONTINUED] nitroGLYCERIN (NITROSTAT) 0.4 MG SL tablet Place 1 tablet (0.4 mg total) under  the tongue every 5 (five) minutes as needed for chest pain (CP or SOB).     Allergies:   Patient has no known allergies.   Social History   Socioeconomic History   Marital status: Married    Spouse name: Not on file   Number of children: Not on file   Years of education: Not on file   Highest education level: Not on file  Occupational History   Occupation: retired  Tobacco Use   Smoking status: Former Smoker    Packs/day: 1.50    Years: 40.00    Pack years: 60.00    Types: Cigarettes    Quit date: 08/20/2003    Years since quitting: 16.2   Smokeless tobacco: Never Used  Substance and Sexual Activity   Alcohol use: No    Alcohol/week: 0.0 standard drinks  Drug use: No   Sexual activity: Not Currently  Other Topics Concern   Not on file  Social History Narrative   Not on file   Social Determinants of Health   Financial Resource Strain:    Difficulty of Paying Living Expenses: Not on file  Food Insecurity:    Worried About [place] in the Last Year: Not on file   Ran Out of Food in the Last Year: Not on file  Transportation Needs:    Lack of Transportation (Medical): Not on file   Lack of Transportation (Non-Medical): Not on file  Physical Activity:    Days of Exercise per Week: Not on file   Minutes of Exercise per Session: Not on file  Stress:    Feeling of Stress : Not on file  Social Connections:    Frequency of Communication with Friends and Family: Not on file   Frequency of Social Gatherings with Friends and Family: Not on file   Attends Religious Services: Not on file   Active Member of Clubs or Organizations: Not on file   Attends Archivist Meetings: Not on file   Marital Status: Not on file     Family History: The patient's family history includes Brain cancer in his father; CAD in his brother; Cancer - Other in his mother; Colon polyps in his brother.  ROS:   Please see the history of present  illness.     All other systems reviewed and are negative.  EKGs/Labs/Other Studies Reviewed:    The following studies were reviewed today:  Myoview 03/20/2017  The left ventricular ejection fraction is mildly decreased (45-54%).  Nuclear stress EF: 46%.  There was no ST segment deviation noted during stress.  Findings consistent with prior myocardial infarction.  This is a low risk study.   Small inferior wall infarct at mid and basal level EF 46% No ischemia  EKG:  EKG is ordered today.  The ekg ordered today demonstrates normal sinus rhythm, nonspecific changes, mild T wave inversion in lateral leads  Recent Labs: 12/16/2018: Hemoglobin 13.9; Platelets 279 11/30/2019: ALT 16; BUN 12; Creatinine, Ser 0.82; Potassium 4.7; Sodium 140  Recent Lipid Panel    Component Value Date/Time   CHOL 144 11/30/2019 0927   TRIG 130 11/30/2019 0927   HDL 52 11/30/2019 0927   CHOLHDL 2.8 11/30/2019 0927   CHOLHDL 2.6 01/04/2017 0851   VLDL 17 01/04/2017 0851   LDLCALC 69 11/30/2019 0927    Physical Exam:    VS:  BP 124/68    Pulse 61    Temp (!) 97.3 F (36.3 C)    Ht 5\' 11"  (1.803 m)    Wt 165 lb 12.8 oz (75.2 kg)    SpO2 96%    BMI 23.12 kg/m     Wt Readings from Last 3 Encounters:  11/30/19 165 lb 12.8 oz (75.2 kg)  11/23/19 166 lb 8 oz (75.5 kg)  10/07/19 169 lb (76.7 kg)     GEN:  Well nourished, well developed in no acute distress HEENT: Normal NECK: No JVD; No carotid bruits LYMPHATICS: No lymphadenopathy CARDIAC: RRR, no murmurs, rubs, gallops RESPIRATORY:  Clear to auscultation without rales, wheezing or rhonchi  ABDOMEN: Soft, non-tender, non-distended MUSCULOSKELETAL:  No edema; No deformity  SKIN: Warm and dry NEUROLOGIC:  Alert and oriented x 3 PSYCHIATRIC:  Normal affect   ASSESSMENT:    1. Preoperative cardiovascular examination   2. Hyperlipidemia, unspecified hyperlipidemia type   3.  Coronary artery disease involving native coronary artery of native  heart without angina pectoris   4. COVID-19   5. Bilateral carotid artery stenosis   6. Chronic obstructive pulmonary disease, unspecified COPD type ([place])    PLAN:    In order of problems listed above:  1. Preoperative clearance: Patient is cleared to proceed with colonoscopy.  He may hold Plavix for 5 to 7 days prior to the procedure and restart as soon as possible afterward.    2. CAD: Denies any recent chest pain.  EKG continue to show T wave inversion in the lateral leads.  He is quite active at this point and does not have any symptoms.  We will continue observation.  Continue aspirin Lipitor  3. Hyperlipidemia: On Lipitor 80 mg daily.  Obtain fasting lipid panel and CMP  4. Recent COVID-19 infection: He is recovering well.  He still have diminished breath sounds in bilateral bases likely related to recent infection  5. Bilateral carotid artery stenosis: Last carotid Doppler in April 2018 showed mild disease bilaterally.  Plan to repeat carotid Doppler on the next office visit.  6. COPD: No recurrence.   Medication Adjustments/Labs and Tests Ordered: Current medicines are reviewed at length with the patient today.  Concerns regarding medicines are outlined above.  Orders Placed This Encounter  Procedures   Comprehensive metabolic panel   Lipid panel   EKG 12-Lead   Meds ordered this encounter  Medications   nitroGLYCERIN (NITROSTAT) 0.4 MG SL tablet    Sig: Place 1 tablet (0.4 mg total) under the tongue every 5 (five) minutes as needed for chest pain (CP or SOB).    Dispense:  25 tablet    Refill:  12   clopidogrel (PLAVIX) 75 MG tablet    Sig: Take 1 tablet (75 mg total) by mouth daily.    Dispense:  90 tablet    Refill:  3   carvedilol (COREG) 3.125 MG tablet    Sig: Take 1 tablet (3.125 mg total) by mouth 2 (two) times daily with a meal. Take 1 tablet by mouth 2 times a day    Dispense:  180 tablet    Refill:  3   atorvastatin (LIPITOR) 80 MG tablet    Sig:  TAKE 1 TABLET (80 MG TOTAL) BY MOUTH DAILY AT 6 PM. (Needs to be seen before next refill)    Dispense:  90 tablet    Refill:  3    Patient Instructions  Medication Instructions:   Continue to take Carvedilol (Coreg) 3.125 mg 1 tablet 2 times a day  *If you need a refill on your cardiac medications before your next appointment, please call your pharmacy*  Lab Work: Your physician recommends that you return for lab work TODAY:  CMET  FASTING LIPID PANEL  If you have labs (blood work) drawn today and your tests are completely normal, you will receive your results only by:  MyChart Message (if you have MyChart) OR  A paper copy in the mail If you have any lab test that is abnormal or we need to change your treatment, we will call you to review the results.  Testing/Procedures:  NONE ordered at this time of appointment   Follow-Up: At [facility], you and your health needs are our priority.  As part of our continuing mission to provide you with exceptional heart care, we have created designated Provider Care Teams.  These Care Teams include your primary Cardiologist (physician) and Advanced Practice Providers (APPs -  Physician Assistants and Nurse Practitioners) who all work together to provide you with the care you need, when you need it.  Your next appointment:   12 month(s)  The format for your next appointment:   In Person  Provider:   Shelva Majestic, MD  Other Instructions      Signed, [name], [place]  12/02/2019 10:03 PM    [place]

## 2019-11-30 NOTE — Patient Instructions (Addendum)
Medication Instructions:   Continue to take Carvedilol (Coreg) 3.125 mg 1 tablet 2 times a day  *If you need a refill on your cardiac medications before your next appointment, please call your pharmacy*  Lab Work: Your physician recommends that you return for lab work TODAY:  CMET  FASTING LIPID PANEL  If you have labs (blood work) drawn today and your tests are completely normal, you will receive your results only by: Marland Kitchen MyChart Message (if you have MyChart) OR . A paper copy in the mail If you have any lab test that is abnormal or we need to change your treatment, we will call you to review the results.  Testing/Procedures:  NONE ordered at this time of appointment   Follow-Up: At [facility], you and your health needs are our priority.  As part of our continuing mission to provide you with exceptional heart care, we have created designated Provider Care Teams.  These Care Teams include your primary Cardiologist (physician) and Advanced Practice Providers (APPs -  Physician Assistants and Nurse Practitioners) who all work together to provide you with the care you need, when you need it.  Your next appointment:   12 month(s)  The format for your next appointment:   In Person  Provider:   Shelva Majestic, MD  Other Instructions

## 2019-11-30 NOTE — Progress Notes (Signed)
Agree with the assessment and plan as outlined by Nicoletta Ba, PA-C. While he does have a hx of Laryngeal CA with trach in place, he is o/w not O2 dependent or with any other high risk features necessitating procedure needs to be done at [facility]. Given current limitations on elective cases done in the hospital endoscopy units due to Covid, I feel this can be done at the [facility] in order to appropriately expedite his evaluations. Plan for Colonoscopy to be done with me at [facility].   Gwenivere Hiraldo, DO, [facility]

## 2019-12-01 LAB — COMPREHENSIVE METABOLIC PANEL
ALT: 16 IU/L (ref 0–44)
AST: 19 IU/L (ref 0–40)
Albumin/Globulin Ratio: 1.6 (ref 1.2–2.2)
Albumin: 4 g/dL (ref 3.7–4.7)
Alkaline Phosphatase: 88 IU/L (ref 39–117)
BUN/Creatinine Ratio: 15 (ref 10–24)
BUN: 12 mg/dL (ref 8–27)
Bilirubin Total: 0.7 mg/dL (ref 0.0–1.2)
CO2: 25 mmol/L (ref 20–29)
Calcium: 9.1 mg/dL (ref 8.6–10.2)
Chloride: 101 mmol/L (ref 96–106)
Creatinine, Ser: 0.82 mg/dL (ref 0.76–1.27)
GFR calc Af Amer: 102 mL/min/{1.73_m2} (ref >59)
GFR calc non Af Amer: 88 mL/min/{1.73_m2} (ref >59)
Globulin, Total: 2.5 g/dL (ref 1.5–4.5)
Glucose: 74 mg/dL (ref 65–99)
Potassium: 4.7 mmol/L (ref 3.5–5.2)
Sodium: 140 mmol/L (ref 134–144)
Total Protein: 6.5 g/dL (ref 6.0–8.5)

## 2019-12-01 LAB — LIPID PANEL
Chol/HDL Ratio: 2.8 ratio (ref 0.0–5.0)
Cholesterol, Total: 144 mg/dL (ref 100–199)
HDL: 52 mg/dL (ref >39)
LDL Chol Calc (NIH): 69 mg/dL (ref 0–99)
Triglycerides: 130 mg/dL (ref 0–149)
VLDL Cholesterol Cal: 23 mg/dL (ref 5–40)

## 2019-12-02 ENCOUNTER — Encounter: Payer: Self-pay | Admitting: Physician Assistant

## 2019-12-02 NOTE — Progress Notes (Signed)
LMOM for patient to call back regarding scheduling colonoscopy in [place]. Patient has been cleared to hold Plavix 5-7 days prior to procedure.

## 2019-12-02 NOTE — Progress Notes (Signed)
Amy and Dr.Cirigliano,  This pt is cleared for anesthetic care at [facility].  Thanks,  Osvaldo Angst

## 2019-12-03 ENCOUNTER — Other Ambulatory Visit: Payer: Self-pay | Admitting: Gastroenterology

## 2019-12-03 ENCOUNTER — Telehealth: Payer: Self-pay

## 2019-12-03 DIAGNOSIS — Z01818 Encounter for other preprocedural examination: Secondary | ICD-10-CM

## 2019-12-03 DIAGNOSIS — R195 Other fecal abnormalities: Secondary | ICD-10-CM

## 2019-12-03 NOTE — Telephone Encounter (Signed)
Spoke to patients wife to let her know that patient can have his colonoscopy done at the [facility] instead of [facility]. Patient is scheduled for his colonoscopy on 12/23/19. He has been cleared to hold Plavix 5 days prior to procedure by his cardiologist.He will have Covid-19 testing done 2 days prior to procedure Hospital case for 12-21-19 has been cancelled. Instructions have been verbalized over the phone as and mailed out. Patient's wife voiced understanding,and will call our office if he has any questions

## 2019-12-03 NOTE — Progress Notes (Signed)
Spoke to patients wife to let her know that patient can have his colonoscopy done at the [facility] instead of [facility]. Patient is scheduled for his colonoscopy on 12/23/19. He has been cleared to hold Plavix 5 days prior to procedure by his cardiologist.He will have Covid-19 testing done 2 days prior to procedure Hospital case for 12-21-19 has been cancelled. Instructions have been verbalized over the phone as and mailed out. Patient's wife voiced understanding,and will call our office if he has any questions.

## 2019-12-03 NOTE — Telephone Encounter (Signed)
Spoke with patients wife today.  She states someone called and left a message on their phone for them to call back.  It appears [name] from [employer] left a message for patient.  Per [name] patient has been cleared by Cardiologist and needs to have his procedure moved from the hospital to [facility].  She states she will contact the patient and reschedule his procedure.

## 2019-12-04 NOTE — Telephone Encounter (Signed)
Spoke to patient's wife. All questions answered regarding procedure. Instructions emailed to patient to review until he receives in the mail

## 2019-12-04 NOTE — Telephone Encounter (Signed)
Patient's wife is calling with more questions

## 2019-12-09 ENCOUNTER — Encounter: Payer: Self-pay | Admitting: Gastroenterology

## 2019-12-21 ENCOUNTER — Encounter (HOSPITAL_COMMUNITY): Payer: Self-pay

## 2019-12-21 ENCOUNTER — Other Ambulatory Visit: Payer: Self-pay

## 2019-12-21 ENCOUNTER — Ambulatory Visit (INDEPENDENT_AMBULATORY_CARE_PROVIDER_SITE_OTHER): Payer: Self-pay

## 2019-12-21 ENCOUNTER — Other Ambulatory Visit: Payer: Self-pay | Admitting: Gastroenterology

## 2019-12-21 ENCOUNTER — Ambulatory Visit (HOSPITAL_COMMUNITY): Admit: 2019-12-21 | Payer: Medicare HMO | Admitting: Gastroenterology

## 2019-12-21 DIAGNOSIS — Z1159 Encounter for screening for other viral diseases: Secondary | ICD-10-CM | POA: Diagnosis not present

## 2019-12-21 SURGERY — COLONOSCOPY WITH PROPOFOL
Anesthesia: Monitor Anesthesia Care

## 2019-12-23 ENCOUNTER — Ambulatory Visit (AMBULATORY_SURGERY_CENTER): Payer: Medicare HMO | Admitting: Gastroenterology

## 2019-12-23 ENCOUNTER — Other Ambulatory Visit: Payer: Self-pay

## 2019-12-23 ENCOUNTER — Encounter: Payer: Self-pay | Admitting: Gastroenterology

## 2019-12-23 VITALS — BP 128/80 | HR 74 | Temp 97.1°F | Resp 14 | Ht 71.0 in | Wt 165.0 lb

## 2019-12-23 DIAGNOSIS — I2510 Atherosclerotic heart disease of native coronary artery without angina pectoris: Secondary | ICD-10-CM | POA: Diagnosis not present

## 2019-12-23 DIAGNOSIS — D124 Benign neoplasm of descending colon: Secondary | ICD-10-CM | POA: Diagnosis not present

## 2019-12-23 DIAGNOSIS — K621 Rectal polyp: Secondary | ICD-10-CM | POA: Diagnosis not present

## 2019-12-23 DIAGNOSIS — K640 First degree hemorrhoids: Secondary | ICD-10-CM

## 2019-12-23 DIAGNOSIS — J449 Chronic obstructive pulmonary disease, unspecified: Secondary | ICD-10-CM | POA: Diagnosis not present

## 2019-12-23 DIAGNOSIS — I252 Old myocardial infarction: Secondary | ICD-10-CM | POA: Diagnosis not present

## 2019-12-23 DIAGNOSIS — D122 Benign neoplasm of ascending colon: Secondary | ICD-10-CM

## 2019-12-23 DIAGNOSIS — D123 Benign neoplasm of transverse colon: Secondary | ICD-10-CM

## 2019-12-23 DIAGNOSIS — D129 Benign neoplasm of anus and anal canal: Secondary | ICD-10-CM

## 2019-12-23 DIAGNOSIS — R195 Other fecal abnormalities: Secondary | ICD-10-CM | POA: Diagnosis not present

## 2019-12-23 DIAGNOSIS — D128 Benign neoplasm of rectum: Secondary | ICD-10-CM

## 2019-12-23 DIAGNOSIS — D120 Benign neoplasm of cecum: Secondary | ICD-10-CM | POA: Diagnosis not present

## 2019-12-23 DIAGNOSIS — D125 Benign neoplasm of sigmoid colon: Secondary | ICD-10-CM

## 2019-12-23 DIAGNOSIS — K5730 Diverticulosis of large intestine without perforation or abscess without bleeding: Secondary | ICD-10-CM

## 2019-12-23 HISTORY — PX: COLONOSCOPY: SHX174

## 2019-12-23 MED ORDER — SODIUM CHLORIDE 0.9 % IV SOLN: 500.0000 mL | Freq: Once | INTRAVENOUS | Status: DC

## 2019-12-23 NOTE — Patient Instructions (Signed)
Information on polyps, diverticulosis and hemorrhoids given to you today.  Await pathology results.  Resume Plavix in 3 days.  Repeat colonoscopy in 3 years.  YOU HAD AN ENDOSCOPIC PROCEDURE TODAY AT [facility]:   Refer to the procedure report that was given to you for any specific questions about what was found during the examination.  If the procedure report does not answer your questions, please call your gastroenterologist to clarify.  If you requested that your care partner not be given the details of your procedure findings, then the procedure report has been included in a sealed envelope for you to review at your convenience later.  YOU SHOULD EXPECT: Some feelings of bloating in the abdomen. Passage of more gas than usual.  Walking can help get rid of the air that was put into your GI tract during the procedure and reduce the bloating. If you had a lower endoscopy (such as a colonoscopy or flexible sigmoidoscopy) you may notice spotting of blood in your stool or on the toilet paper. If you underwent a bowel prep for your procedure, you may not have a normal bowel movement for a few days.  Please Note:  You might notice some irritation and congestion in your nose or some drainage.  This is from the oxygen used during your procedure.  There is no need for concern and it should clear up in a day or so.  SYMPTOMS TO REPORT IMMEDIATELY:   Following lower endoscopy (colonoscopy or flexible sigmoidoscopy):  Excessive amounts of blood in the stool  Significant tenderness or worsening of abdominal pains  Swelling of the abdomen that is new, acute  Fever of 100F or higher   For urgent or emergent issues, a gastroenterologist can be reached at any hour by calling [phone].   DIET:  We do recommend a small meal at first, but then you may proceed to your regular diet.  Drink plenty of fluids but you should avoid alcoholic beverages for 24 hours.  ACTIVITY:  You should  plan to take it easy for the rest of today and you should NOT DRIVE or use heavy machinery until tomorrow (because of the sedation medicines used during the test).    FOLLOW UP: Our staff will call the number listed on your records 48-72 hours following your procedure to check on you and address any questions or concerns that you may have regarding the information given to you following your procedure. If we do not reach you, we will leave a message.  We will attempt to reach you two times.  During this call, we will ask if you have developed any symptoms of COVID 19. If you develop any symptoms (ie: fever, flu-like symptoms, shortness of breath, cough etc.) before then, please call [phone].  If you test positive for Covid 19 in the 2 weeks post procedure, please call and report this information to [place].    If any biopsies were taken you will be contacted by phone or by letter within the next 1-3 weeks.  Please call us at [phone] if you have not heard about the biopsies in 3 weeks.    SIGNATURES/CONFIDENTIALITY: You and/or your care partner have signed paperwork which will be entered into your electronic medical record.  These signatures attest to the fact that that the information above on your After Visit Summary has been reviewed and is understood.  Full responsibility of the confidentiality of this discharge information lies with you and/or your care-partner.

## 2019-12-23 NOTE — Progress Notes (Signed)
Called to room to assist during endoscopic procedure.  Patient ID and intended procedure confirmed with present staff. Received instructions for my participation in the procedure from the performing physician.  

## 2019-12-23 NOTE — Progress Notes (Signed)
Temp check by:LC  Vital check by:CW  The medical and surgical history was reviewed and verified with the patient.

## 2019-12-23 NOTE — Progress Notes (Signed)
Report given to PACU, vss 

## 2019-12-23 NOTE — Op Note (Signed)
[facility] Patient Name: Cory Sloan Procedure Date: 12/23/2019 9:23 AM MRN: [id number] Endoscopist: Gerrit Heck , MD Age: 74 Referring MD:  Date of Birth: 1946/11/12 Gender: Male Account #: [id number] Procedure:                Colonoscopy Indications:              Positive fecal immunochemical test, Family history                            of colonic polyps in a first-degree relative                            (brothers x2). No prior colonoscopy. Medicines:                Monitored Anesthesia Care Procedure:                Pre-Anesthesia Assessment:                           - Prior to the procedure, a History and Physical                            was performed, and patient medications and                            allergies were reviewed. The patient's tolerance of                            previous anesthesia was also reviewed. The risks                            and benefits of the procedure and the sedation                            options and risks were discussed with the patient.                            All questions were answered, and informed consent                            was obtained. Prior Anticoagulants: The patient has                            taken Plavix (clopidogrel), last dose was 7 days                            prior to procedure. Still taking ASA 81 mg. ASA                            Grade Assessment: III - A patient with severe                            systemic disease. After reviewing the risks and  benefits, the patient was deemed in satisfactory                            condition to undergo the procedure.                           After obtaining informed consent, the colonoscope                            was passed under direct vision. Throughout the                            procedure, the patient's blood pressure, pulse, and                            oxygen saturations were monitored  continuously. The                            Colonoscope was introduced through the anus and                            advanced to the the terminal ileum. The colonoscopy                            was performed without difficulty. The patient                            tolerated the procedure well. The quality of the                            bowel preparation was adequate. The terminal ileum,                            ileocecal valve, appendiceal orifice, and rectum                            were photographed. Scope In: 9:39:20 AM Scope Out: 10:34:50 AM Scope Withdrawal Time: 0 hours 46 minutes 0 seconds  Total Procedure Duration: 0 hours 55 minutes 30 seconds  Findings:                 The perianal and digital rectal examinations were                            normal.                           12 sessile polyps were found in the sigmoid colon.                            The polyps were 3 to 9 mm in size. These polyps                            were removed with a cold snare. Resection and  retrieval were complete. There was mild persistent                            oozing from one of the polypectomy sites in the                            proximal sigmoid colon. To prevent bleeding after                            the polypectomy, two hemostatic clips were                            successfully placed (MR conditional). There was no                            bleeding at the end of the procedure. Estimated                            blood loss was minimal.                           Seven sessile polyps were found in the transverse                            colon (3), ascending colon (2), and cecum (2). The                            polyps were 3 to 8 mm in size. These polyps were                            removed with a cold snare. Resection and retrieval                            were complete. Estimated blood loss was minimal.                            Five sessile polyps were found in the descending                            colon. The polyps were 3 to 6 mm in size. These                            polyps were removed with a cold snare. Resection                            and retrieval were complete. Estimated blood loss                            was minimal.                           Three sessile polyps were found in the rectum. The  polyps were 2 to 3 mm in size. These polyps were                            removed with a cold biopsy forceps. Resection and                            retrieval were complete. Estimated blood loss was                            minimal.                           A single small-mouthed diverticulum was found in                            the sigmoid colon.                           Non-bleeding internal hemorrhoids were found during                            retroflexion. The hemorrhoids were small.                           The terminal ileum appeared normal. Complications:            No immediate complications. Estimated Blood Loss:     Estimated blood loss was minimal. Impression:               - 12 3 to 9 mm polyps in the sigmoid colon, removed                            with a cold snare. Resected and retrieved. Clips                            (MR conditional) were placed.                           - Seven 3 to 8 mm polyps in the transverse colon,                            in the ascending colon and in the cecum, removed                            with a cold snare. Resected and retrieved.                           - Five 3 to 6 mm polyps in the descending colon,                            removed with a cold snare. Resected and retrieved.                           - Three 2 to 3 mm polyps in the  rectum, removed                            with a cold biopsy forceps. Resected and retrieved.                           - Diverticulosis in the sigmoid colon.                            - Non-bleeding internal hemorrhoids.                           - The examined portion of the ileum was normal. Recommendation:           - Patient has a contact number available for                            emergencies. The signs and symptoms of potential                            delayed complications were discussed with the                            patient. Return to normal activities tomorrow.                            Written discharge instructions were provided to the                            patient.                           - Resume previous diet.                           - Resume Plavix (clopidogrel) at prior dose in 3                            days.                           - Await pathology results.                           - Repeat colonoscopy in 1 year for surveillance.                           - Return to GI clinic PRN. Gerrit Heck, MD 12/23/2019 10:46:13 AM

## 2019-12-25 ENCOUNTER — Telehealth: Payer: Self-pay | Admitting: *Deleted

## 2019-12-25 NOTE — Telephone Encounter (Signed)
  Follow up Call-  Call back number 12/23/2019  Post procedure Call Back phone  # [phone]  Permission to leave phone message Yes  Some recent data might be hidden     Patient questions:  Do you have a fever, pain , or abdominal swelling? No. Pain Score  0 *  Have you tolerated food without any problems? Yes.    Have you been able to return to your normal activities? Yes.    Do you have any questions about your discharge instructions: Diet   No. Medications  No. Follow up visit  No.  Do you have questions or concerns about your Care? No.  Actions: * If pain score is 4 or above: No action needed, pain <4.  1. Have you developed a fever since your procedure? no  2.   Have you had an respiratory symptoms (SOB or cough) since your procedure? no  3.   Have you tested positive for COVID 19 since your procedure no  4.   Have you had any family members/close contacts diagnosed with the COVID 19 since your procedure?  no   If yes to any of these questions please route to Joylene John, RN and Alphonsa Gin, Therapist, sports.

## 2019-12-28 ENCOUNTER — Encounter: Payer: Self-pay | Admitting: Gastroenterology

## 2019-12-29 ENCOUNTER — Telehealth: Payer: Self-pay | Admitting: Gastroenterology

## 2019-12-29 NOTE — Telephone Encounter (Signed)
Called and spoke with patient's wife-Cory Sloan-verified DPR-Cory Sloan reports the patient has been constipated the last few days and he had been having bowel movements that are harder and "small round balls"-Cory Sloan was informed to have the patient increase fluid intake, eat green leafy veggies, try Miralax 2-3 times per day until having normal,regular bowel movements; [name] agreed to plan of care; [name] verbalized understanding of information/instructions; [name] was advised to call the office at [phone] if questions/concerns arise;  Please advise of additional information for patient's plan of care

## 2019-12-29 NOTE — Telephone Encounter (Signed)
Patient's wife called saying her husband had a procedure on 12/23/19 and since this morning he has been feeling like he needs to go to the bathroom but can not please advise.

## 2019-12-30 NOTE — Telephone Encounter (Signed)
Left message for patient to call back to the office;  

## 2019-12-30 NOTE — Telephone Encounter (Signed)
Agree with this plan.

## 2019-12-31 NOTE — Telephone Encounter (Signed)
Called and spoke with patient's wife-Cory Sloan-verified DPR-Cory Sloan informed of MD recommendations; Cory Sloan is agreeable with plan of care and verbalized understanding of information/instructions;  Cory Sloan was advised to call the office at [phone] if questions/concerns arise;

## 2020-04-26 ENCOUNTER — Telehealth: Payer: Self-pay | Admitting: Cardiovascular Disease

## 2020-04-26 NOTE — Telephone Encounter (Signed)
Lm to call back ./cy 

## 2020-04-26 NOTE — Telephone Encounter (Signed)
Follow Up   Reached out to [facility] triage to try and transfer Stat CP call and could not get an answer. I then reached out to [facility] triage and spoke with Legrand Como and Anderson Malta who advised me to send it to the pool without speaking to the patient.

## 2020-04-26 NOTE — Telephone Encounter (Signed)
Called and spoke with pt's wife per DPR, states that her husband had been complaining of chest pain and SOB since Sunday, pt took nitroglycerine twice and this helped and has not taken any since that day.  Pt denies active chest pain while on the phone but does endorse some SOB. Notified that with chest pain we recommend going to his nearest ED to be evaluated. Wife stated that the pt wont want to go and that when he had his heart attack years ago it took everything for her to get him there. Reported that she would have called sooner but she had to get his permission first. Attempted to make pt an office visit with app, but none available in a timely manner. Wife states that his BP has also been running lower and that it was 98/67 today but yesterday it was 146/94.  Again advised that with chest pain we recommend going to the nearest ED to be evaluated. Wife verbalized understanding and stated she will try to get him to go. States that if he begins complaining of chest pain again she will just go ahead and dial 911 Notified that if they have other questions to please let us know and that we have an after hours provider that they could call as well. Wife verbalized understanding again and stated she will try to get him to go to the ED. No other questions at this time.

## 2020-04-26 NOTE — Telephone Encounter (Signed)
Pt c/o of Chest Pain: STAT if CP now or developed within 24 hours  1. Are you having CP right now? No   2. Are you experiencing any other symptoms (ex. SOB, nausea, vomiting, sweating)? SOB   3. How long have you been experiencing CP? Started Sunday 04/24/20  4. Is your CP continuous or coming and going? Coming and going  5. Have you taken Nitroglycerin? Yes  ?  [name] is calling with Cory Sloan to report his recent CP. She states he reported feeling like he pulled a muscle in his chest about 2 weeks ago, but Sunday he had two cases of actual CP and had to end up taking a Nitroglycerin in regards to it. Since Sunday he had a reoccurring episode on Monday, but did not take nitroglycerin due to it. [place] states he is currently having chest tightness and SOB. They would like an appointment in regards to this asap, but both Dr. [name] and his PA's don't have anything available before July. Please advise.

## 2020-04-26 NOTE — Telephone Encounter (Signed)
New Message   Pts wife is returning call

## 2020-05-03 NOTE — Telephone Encounter (Signed)
Try to work in for an office visit with APP or me

## 2020-05-04 ENCOUNTER — Telehealth: Payer: Self-pay | Admitting: *Deleted

## 2020-05-04 NOTE — Telephone Encounter (Signed)
A message was left, re: his follow up visit. 

## 2020-05-11 NOTE — Telephone Encounter (Signed)
Pt has appt with [name] on 05/13/20 at 9:15am

## 2020-05-12 NOTE — Progress Notes (Signed)
Cardiology Clinic Note   Patient Name: DAESHAWN REDMANN Date of Encounter: 05/13/2020  Primary Care Provider:  Claretta Fraise, MD Primary Cardiologist:  Shelva Majestic, MD  Patient Profile    [place] Dec. Reece 74 year old male presents for evaluation of his chest pain.  Past Medical History    Past Medical History:  Diagnosis Date  . Carotid artery stenosis    1-39% bilateral stenosis  . COPD (chronic obstructive pulmonary disease) ([place])   . HLD (hyperlipidemia)   . ST elevation (STEMI) myocardial infarction involving right coronary artery ([place]) 10/04/2015  . STEMI (ST elevation myocardial infarction) [facility]) November 2016   with PCI to RCA and associated cardiogenic shock  . 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 Tobacco abuse    Past Surgical History:  Procedure Laterality Date  . APPENDECTOMY    . CARDIAC CATHETERIZATION N/A 10/04/2015   Procedure: Left Heart Cath and Coronary Angiography;  Surgeon: Troy Sine, MD;  Location: [place] [facility];  Service: Cardiovascular;  Laterality: N/A;  . CARDIAC CATHETERIZATION N/A 10/04/2015   Procedure: Coronary Stent Intervention;  Surgeon: Troy Sine, MD;  Location: [facility] [facility];  Service: Cardiovascular;  Laterality: N/A;  . COLONOSCOPY  12/23/2019  . THROAT SURGERY  2005   [place]    Allergies  No Known Allergies  History of Present Illness    Mr. Charters has a PMH of coronary artery disease, carotid artery disease, COPD, hyperlipidemia, history of tobacco abuse, and throat cancer.  He had acute STEMI in 11/16 due to RCA occlusion.  He had a cardiac catheterization and successful placement of DES x1 to his RCA.  His EF was 50% with mild inferior hypokinesis.  His hospital course was complicated by cardiogenic shock and hypotension post cath.  A CT of his chest during his hospitalization showed emphysema.  Echocardiogram 12/16 showed LVEF of 50-55% with  akinesis of the basal inferior myocardium, G1 DD.  He was switched to Plavix due to shortness of breath.  His PA Y2 12 showed good platelet inhibition.  In 4/18 he presented to the hospital with recurrent chest pain.  A CT angiogram of his chest showed incidental finding of right common iliac artery dissection likely short segment without associated decrease in pulse, pain or paresthesia.  Vascular surgery was consulted and felt the images did not represent dissection.  His chest pain was attributed to musculoskeletal pain.  An echocardiogram showed an EF of 55-60%, G1 DD.  A nuclear stress test 5/18 showed low risk and an EF of 46%.  He was last seen by [name], PA-C on 11/30/2019.  During that time he denied chest pain and was cleared for surgery.  He was given permission to hold Plavix for 5-7 days prior to his procedure and restart soon as hemostasis was achieved.  He was noted to have diminished breath sounds in bilateral lung bases which was believed to be related to his prior COVID-19 infection.  He was overall doing well at that time.  He presents to the clinic today for follow-up evaluation and evaluation of his chest pain.  He states he feels well today.  He indicates that he had one episode of chest discomfort after leaving a flea market.  He was not active at the time and sitting in his truck.  He states that he took 1 nitroglycerin this relieves the pain and he has not  had any pain since.  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  We discussed the option of a nuclear stress test for further evaluation.  He wishes to defer at this time due to the fact that he has had not had chest discomfort in several weeks.  He states that he also had a COVID-19 virus earlier in the year and has recovered well.  He appears to have no residual side effects from the virus.  We will refill his nitroglycerin.  I will give him salty 6 diet sheet,  have him maintain his physical activity, continue to monitor for chest discomfort, and have him follow-up with Dr. [name] in 3 months.  Today he denies chest pain, shortness of breath, lower extremity edema, fatigue, palpitations, melena, hematuria, hemoptysis, diaphoresis, weakness, presyncope, syncope, orthopnea, and PND.   Home Medications    Prior to Admission medications   Medication Sig Start Date End Date Taking? Authorizing Provider  albuterol (PROVENTIL HFA;VENTOLIN HFA) 108 (90 Base) MCG/ACT inhaler Inhale 1-2 puffs into the lungs every 4 (four) hours as needed for wheezing or shortness of breath (or coughing). 12/16/18   Claretta Fraise, MD  aspirin EC 81 MG EC tablet Take 1 tablet (81 mg total) by mouth daily. 10/07/15   Bhagat, Crista Luria, PA  atorvastatin (LIPITOR) 80 MG tablet TAKE 1 TABLET (80 MG TOTAL) BY MOUTH DAILY AT 6 PM. (Needs to be seen before next refill) 11/30/19   [name], PA  benzonatate (TESSALON) 100 MG capsule Take 200 mg by mouth 3 (three) times daily as needed. 11/17/19   [provider]  carvedilol (COREG) 3.125 MG tablet Take 1 tablet (3.125 mg total) by mouth 2 (two) times daily with a meal. Take 1 tablet by mouth 2 times a day 11/30/19   [name], PA  clopidogrel (PLAVIX) 75 MG tablet Take 1 tablet (75 mg total) by mouth daily. 11/30/19   [name], PA  doxycycline (VIBRA-TABS) 100 MG tablet Take 100 mg by mouth 2 (two) times daily. 11/17/19   [provider]  nitroGLYCERIN (NITROSTAT) 0.4 MG SL tablet Place 1 tablet (0.4 mg total) under the tongue every 5 (five) minutes as needed for chest pain (CP or SOB). 11/30/19   [name], PA  Soft Lens Products (STERILE SALINE) SOLN Use saily to flush trach after use of Inhaler Memory Dance) 12/16/18   Claretta Fraise, MD    Family History    Family History  Problem Relation Age of Onset  . Cancer - Other Mother   . Breast cancer Mother   . Brain cancer Father   . CAD Brother   . Colon polyps Brother   .  Colon cancer Neg Hx   . Esophageal cancer Neg Hx   . Rectal cancer Neg Hx   . Stomach cancer Neg Hx    He indicated that his mother is deceased. He indicated that his father is deceased. He indicated that the status of his brother is unknown. He indicated that his maternal grandmother is deceased. He indicated that his maternal grandfather is deceased. He indicated that his paternal grandmother is deceased. He indicated that his paternal grandfather is deceased. He indicated that the status of his neg hx is unknown.  Social History    Social History   Socioeconomic History  . Marital status: Married    Spouse name: Not on file  . Number of children: Not on file  . Years of  education: Not on file  . Highest education level: Not on file  Occupational History  . Occupation: retired  Tobacco Use  . Smoking status: Former Smoker    Packs/day: 1.50    Years: 40.00    Pack years: 60.00    Types: Cigarettes    Quit date: 08/20/2003    Years since quitting: 16.7  . Smokeless tobacco: Never Used  Vaping Use  . Vaping Use: Never used  Substance and Sexual Activity  . Alcohol use: No    Alcohol/week: 0.0 standard drinks  . Drug use: No  . Sexual activity: Not Currently  Other Topics Concern  . Not on file  Social History Narrative  . Not on file   Social Determinants of Health   Financial Resource Strain:   . Difficulty of Paying Living Expenses:   Food Insecurity:   . Worried About Charity fundraiser in the Last Year:   . Arboriculturist in the Last Year:   Transportation Needs:   . Film/video editor (Medical):   Marland Kitchen Lack of Transportation (Non-Medical):   Physical Activity:   . Days of Exercise per Week:   . Minutes of Exercise per Session:   Stress:   . Feeling of Stress :   Social Connections:   . Frequency of Communication with Friends and Family:   . Frequency of Social Gatherings with Friends and Family:   . Attends Religious Services:   . Active Member of  Clubs or Organizations:   . Attends Archivist Meetings:   Marland Kitchen Marital Status:   Intimate Partner Violence:   . Fear of Current or Ex-Partner:   . Emotionally Abused:   Marland Kitchen Physically Abused:   . Sexually Abused:      Review of Systems    General:  No chills, fever, night sweats or weight changes.  Cardiovascular:  No chest pain, dyspnea on exertion, edema, orthopnea, palpitations, paroxysmal nocturnal dyspnea. Dermatological: No rash, lesions/masses Respiratory: No cough, dyspnea Urologic: No hematuria, dysuria Abdominal:   No nausea, vomiting, diarrhea, bright red blood per rectum, melena, or hematemesis Neurologic:  No visual changes, wkns, changes in mental status. All other systems reviewed and are otherwise negative except as noted above.  Physical Exam    VS:  BP 118/80   Pulse (!) 55   Temp (!) 97.2 F (36.2 C)   Ht 5\' 11"  (1.803 m)   Wt 162 lb (73.5 kg)   SpO2 97%   BMI 22.59 kg/m  , BMI Body mass index is 22.59 kg/m. GEN: Well nourished, well developed, in no acute distress. HEENT: normal. Neck: Supple, no JVD, carotid bruits, or masses. Cardiac: RRR, no murmurs, rubs, or gallops. No clubbing, cyanosis, edema.  Radials/DP/PT 2+ and equal bilaterally.  Respiratory:  Respirations regular and unlabored, clear to auscultation bilaterally. GI: Soft, nontender, nondistended, BS + x 4. MS: no deformity or atrophy. Skin: warm and dry, no rash. Neuro:  Strength and sensation are intact. Psych: Normal affect.  Accessory Clinical Findings    ECG personally reviewed by me today-sinus bradycardia ST and T wave abnormality consider inferior lateral ischemia 55 bpm- No acute changes  EKG 11/30/2019 Normal sinus rhythm, nonspecific changes mild T wave inversion in lateral leads  Echocardiogram 03/11/2017 Study Conclusions   - Left ventricle: The cavity size was normal. Systolic function was  normal. The estimated ejection fraction was in the range of 55%  to  60%. Possible hypokinesis of the basalinferolateral and  inferior myocardium; consistent with ischemia in the distribution  of the right coronary artery. Doppler parameters are consistent  with abnormal left ventricular relaxation (grade 1 diastolic  dysfunction).  - Left atrium: The atrium was mildly dilated.  Assessment & Plan   1.  Atypical chest pain -no chest pain today. He contacted nurse triage line on 04/26/2020 indicating that he had chest pain that started on 04/24/2020 and that he had noticed chest discomfort for approximately 2 weeks.  He had taken nitroglycerin for the discomfort which relieved his chest discomfort.  He continues to be physically active climbing trees daily.  Offered nuclear stress test he wishes to defer at this time. Continue aspirin, atorvastatin, carvedilol, Plavix, nitroglycerin Heart healthy low-sodium diet-salty 6 given Increase physical activity as tolerated  Hyperlipidemia-LDL 69 on 11/30/2019 Continue atorvastatin Heart healthy low-sodium high-fiber diet Increase physical activity as tolerated  Essential hypertension-BP today 117/80.  Well-controlled at home Continue carvedilol Heart healthy low-sodium diet-salty 6 given Increase physical activity as tolerated  Disposition: Follow-up with Dr. [name] in 3 months.  Jossie Ng. Artyom Stencel NP-C    05/13/2020, 9:47 AM [place] [address] Office [phone] Fax [phone]

## 2020-05-13 ENCOUNTER — Other Ambulatory Visit: Payer: Self-pay

## 2020-05-13 ENCOUNTER — Ambulatory Visit: Payer: Medicare HMO | Admitting: General Practice

## 2020-05-13 ENCOUNTER — Encounter: Payer: Self-pay | Admitting: General Practice

## 2020-05-13 VITALS — BP 118/80 | HR 55 | Temp 97.2°F | Ht 71.0 in | Wt 162.0 lb

## 2020-05-13 DIAGNOSIS — E785 Hyperlipidemia, unspecified: Secondary | ICD-10-CM | POA: Diagnosis not present

## 2020-05-13 DIAGNOSIS — I10 Essential (primary) hypertension: Secondary | ICD-10-CM

## 2020-05-13 DIAGNOSIS — R0789 Other chest pain: Secondary | ICD-10-CM | POA: Diagnosis not present

## 2020-05-13 MED ORDER — NITROGLYCERIN 0.4 MG SL SUBL: 0.4000 mg | SUBLINGUAL_TABLET | SUBLINGUAL | 3 refills | Status: DC | PRN

## 2020-05-13 NOTE — Patient Instructions (Signed)
Medication Instructions:  The current medical regimen is effective;  continue present plan and medications as directed. Please refer to the Current Medication list given to you today. *If you need a refill on your cardiac medications before your next appointment, please call your pharmacy*  Special Instructions PLEASE CALL IF CHEST PAINS PERSISTS OR IS NOT RELIEVED BY NITRO OR GO DIRECTLY TO THE ER  PLEASE READ AND FOLLOW SALTY 6-ATTACHED  Follow-Up: Your next appointment:  3 month(s)  In Person with Shelva Majestic, MD  At [facility], you and your health needs are our priority.  As part of our continuing mission to provide you with exceptional heart care, we have created designated Provider Care Teams.  These Care Teams include your primary Cardiologist (physician) and Advanced Practice Providers (APPs -  Physician Assistants and Nurse Practitioners) who all work together to provide you with the care you need, when you need it.

## 2020-07-20 ENCOUNTER — Telehealth: Payer: Self-pay | Admitting: Cardiovascular Disease

## 2020-07-20 NOTE — Telephone Encounter (Signed)
Advised wife, ok per DPR  

## 2020-07-20 NOTE — Telephone Encounter (Signed)
Will forward to Dr Kelly for review   

## 2020-07-20 NOTE — Telephone Encounter (Signed)
We are recommending the COVID-19 vaccine to all of our patients. Cardiac medications (including blood thinners) should not deter anyone from being vaccinated and there is no need to hold any of those medications prior to vaccine administration.     Currently, there is a hotline to call (active 11/27/19) to schedule vaccination appointments as no walk-ins will be accepted.   Number: [phone].    If an appointment is not available please go to [email or web address] to sign up for notification when additional vaccine appointments are available.   If you have further questions or concerns about the vaccine process, please visit [email or web address] or contact your primary care physician.   The above was read to the patient. Pt still wanted to know since he had a leaky valve to please ask Dr [name] if it was alright for him to take the shot.

## 2020-07-20 NOTE — Telephone Encounter (Signed)
Agree with recommendation to undergo Covid vaccination particularly with the very contagious more aggressive delta variant

## 2020-08-08 ENCOUNTER — Ambulatory Visit: Payer: Medicare HMO | Admitting: Family Medicine

## 2020-08-15 ENCOUNTER — Ambulatory Visit: Payer: Medicare HMO | Admitting: Family Medicine

## 2020-08-17 ENCOUNTER — Encounter: Payer: Self-pay | Admitting: Family Medicine

## 2020-08-30 ENCOUNTER — Other Ambulatory Visit: Payer: Self-pay

## 2020-08-30 ENCOUNTER — Encounter: Payer: Self-pay | Admitting: Cardiovascular Disease

## 2020-08-30 ENCOUNTER — Ambulatory Visit: Payer: Medicare HMO | Admitting: Cardiovascular Disease

## 2020-08-30 DIAGNOSIS — Z8616 Personal history of COVID-19: Secondary | ICD-10-CM

## 2020-08-30 DIAGNOSIS — E785 Hyperlipidemia, unspecified: Secondary | ICD-10-CM | POA: Diagnosis not present

## 2020-08-30 DIAGNOSIS — I2119 ST elevation (STEMI) myocardial infarction involving other coronary artery of inferior wall: Secondary | ICD-10-CM

## 2020-08-30 DIAGNOSIS — Z9002 Acquired absence of larynx: Secondary | ICD-10-CM

## 2020-08-30 DIAGNOSIS — I10 Essential (primary) hypertension: Secondary | ICD-10-CM | POA: Diagnosis not present

## 2020-08-30 NOTE — Patient Instructions (Signed)
Medication Instructions:  Your physician recommends that you continue on your current medications as directed. Please refer to the Current Medication list given to you today.  *If you need a refill on your cardiac medications before your next appointment, please call your pharmacy*  Lab Work: FASTING LP/CMET/HFP/TSH 6 MONTHS PRIOR TO FOLLOW UP   If you have labs (blood work) drawn today and your tests are completely normal, you will receive your results only by: Marland Kitchen MyChart Message (if you have MyChart) OR . A paper copy in the mail If you have any lab test that is abnormal or we need to change your treatment, we will call you to review the results.  Testing/Procedures: Your physician has requested that you have an echocardiogram. Echocardiography is a painless test that uses sound waves to create images of your heart. It provides your doctor with information about the size and shape of your heart and how well your heart's chambers and valves are working. This procedure takes approximately one hour. There are no restrictions for this procedure. 6 MONTHS PRIOR TO FOLLOW UP   Follow-Up: At [facility], you and your health needs are our priority.  As part of our continuing mission to provide you with exceptional heart care, we have created designated Provider Care Teams.  These Care Teams include your primary Cardiologist (physician) and Advanced Practice Providers (APPs -  Physician Assistants and Nurse Practitioners) who all work together to provide you with the care you need, when you need it.  We recommend signing up for the patient portal called "MyChart".  Sign up information is provided on this After Visit Summary.  MyChart is used to connect with patients for Virtual Visits (Telemedicine).  Patients are able to view lab/test results, encounter notes, upcoming appointments, etc.  Non-urgent messages can be sent to your provider as well.   To learn more about what you can do with MyChart, go  to [email or web address].    Your next appointment:   6 month(s)  The format for your next appointment:   In Person  Provider:   DR Buchanan APP

## 2020-08-30 NOTE — Progress Notes (Signed)
Patient ID: Cory Sloan, male   DOB: July 02, 1946, 74 y.o.   MRN: [id number]      HPI: Cory Sloan is a 74 y.o. male who presents to the office today for a 3 year follow-up evaluation.  Cory Sloan presented to [facility] 10/04/2015 with an acute ST segment elevation myocardial infarction secondary to total proximal occlusion of a large, dominant RCA.  He had a prior history of tobacco use and COPD in 2005 underwent laryngectomy for laryngeal CA.  There was no prior history of ischemic heart disease.  He was taken emergently to the catheterization laboratory and underwent successful PCI/DES stenting of a large dominant RCA with ultimate insertion of a 4.032 mm Synergy DES stent postdilated to 4.6 mm with the 100 percent occlusion reduced to 0% and restoration of brisk TIMI-3 flow.  Initial ejection fraction was 50% with mild mid inferior wall hypocontractility.  Post catheterization he developed cardiogenic shock and hypotension, which initially was refractory to IV fluid resuscitation and required dopamine.  A chest CT showed emphysematous disease.  He was seen by Cory Sloan on 2 occasions post hospitalization with the most recent being in December 2016.  An echo Doppler study at that time showed an EF of 50-55% with akinesis of the basal inferior myocardium and grade 1 diastolic dysfunction.  There was mild aortic sclerosis, mild mitral and trivial tricuspid regurgitation.  Blood pressure has been low and as result, he is only been able to be on very minimal medical therapy consisting of carvedilol one half of a 3.125 mg pill twice a day.  Initially he was on Brilinta but this was changed to Plavix due to shortness of breath issues which were felt possibly contributed to Brilinta.  A P2Y12 blood test was 210.  When I saw him in 2017, his LDL had risen to 146 and at that time recommended the addition of Zetia to his atorvastatin.  Apparently he had not taken the atorvastatin for month prior  to that blood work.  As result, he resume taking the atorvastatin at 80 mg daily and never started the Zetia.    He was evaluated in the emergency room on 03/10/2017 with chest pain.  The day prior to this episode, he had been pulling heavy dishes.  The following night he noticed a retrosternal chest discomfort that was different from his prior cardiac pain.  He presented to the emergency room.  Troponin was negative.  He underwent a CT angios an incidental finding of a right common iliac artery dissection in a short segment without associated decrease in pulse, pain, or paresthesias was notified.  The images were subtle.  Reviewed by Dr. Donzetta Matters of vascular surgery who felt that the imaging did not represent dissection.  It was felt that the patient's chest pain was due to his heavy lifting the day previously.  An echo Doppler study was done during that evaluation, which showed an EF of 55-60%. There was grade 1 diastolic dysfunction.  There is mild left atrial dilatation.  He underwent a nuclear stress test on 03/20/2017.  This was a low risk study showing an EF of 46%.  Findings were consistent with prior MI.  There was no ischemia.  Subsequently, he has been without any chest pain.  He continues to be active.  He denies shortness of breath.    Since I last saw him in May 2018, he has been evaluated by several falls or APP's and saw Cory Sloan, [place] in January  2021 and Cory Memos, NP in June 2021.  He continues to feel well.  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  He had Covid in 2020.  Presently he denies chest pain or significant shortness of breath.  He is unaware of any palpitations.  Laboratory in January 2021 showed an LDL cholesterol at 69.  He presents for cardiology evaluation.  Past Medical History:  Diagnosis Date  . Carotid artery stenosis    1-39% bilateral stenosis  . COPD (chronic obstructive pulmonary disease) ([place])   . HLD (hyperlipidemia)   .  ST elevation (STEMI) myocardial infarction involving right coronary artery ([place]) 10/04/2015  . STEMI (ST elevation myocardial infarction) [facility]) November 2016   with PCI to RCA and associated cardiogenic shock  . 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 Tobacco abuse     Past Surgical History:  Procedure Laterality Date  . APPENDECTOMY    . CARDIAC CATHETERIZATION N/A 10/04/2015   Procedure: Left Heart Cath and Coronary Angiography;  Surgeon: Troy Sine, MD;  Location: [place] [facility];  Service: Cardiovascular;  Laterality: N/A;  . CARDIAC CATHETERIZATION N/A 10/04/2015   Procedure: Coronary Stent Intervention;  Surgeon: Troy Sine, MD;  Location: [place] [facility];  Service: Cardiovascular;  Laterality: N/A;  . COLONOSCOPY  12/23/2019  . THROAT SURGERY  2005   [place]    No Known Allergies  Current Outpatient Medications  Medication Sig Dispense Refill  . albuterol (PROVENTIL HFA;VENTOLIN HFA) 108 (90 Base) MCG/ACT inhaler Inhale 1-2 puffs into the lungs every 4 (four) hours as needed for wheezing or shortness of breath (or coughing). 1 Inhaler 5  . aspirin EC 81 MG EC tablet Take 1 tablet (81 mg total) by mouth daily.    Marland Kitchen atorvastatin (LIPITOR) 80 MG tablet TAKE 1 TABLET (80 MG TOTAL) BY MOUTH DAILY AT 6 PM. (Needs to be seen before next refill) 90 tablet 3  . benzonatate (TESSALON) 100 MG capsule Take 200 mg by mouth 3 (three) times daily as needed.    . carvedilol (COREG) 3.125 MG tablet Take 1 tablet (3.125 mg total) by mouth 2 (two) times daily with a meal. Take 1 tablet by mouth 2 times a day 180 tablet 3  . clopidogrel (PLAVIX) 75 MG tablet Take 1 tablet (75 mg total) by mouth daily. 90 tablet 3  . doxycycline (VIBRA-TABS) 100 MG tablet Take 100 mg by mouth 2 (two) times daily.    . nitroGLYCERIN (NITROSTAT) 0.4 MG SL tablet Place 1 tablet (0.4 mg total) under the tongue every 5 (five) minutes as needed for  chest pain (CP or SOB). 25 tablet 3  . Soft Lens Products (STERILE SALINE) SOLN Use saily to flush trach after use of Inhaler (Breo) 100 mL 10   No current facility-administered medications for this visit.    Social History   Socioeconomic History  . Marital status: Married    Spouse name: Not on file  . Number of children: Not on file  . Years of education: Not on file  . Highest education level: Not on file  Occupational History  . Occupation: retired  Tobacco Use  . Smoking status: Former Smoker    Packs/day: 1.50    Years: 40.00    Pack years: 60.00    Types: Cigarettes    Quit date: 08/20/2003    Years  since quitting: 17.0  . Smokeless tobacco: Never Used  Vaping Use  . Vaping Use: Never used  Substance and Sexual Activity  . Alcohol use: No    Alcohol/week: 0.0 standard drinks  . Drug use: No  . Sexual activity: Not Currently  Other Topics Concern  . Not on file  Social History Narrative  . Not on file   Social Determinants of Health   Financial Resource Strain:   . Difficulty of Paying Living Expenses: Not on file  Food Insecurity:   . Worried About Charity fundraiser in the Last Year: Not on file  . Ran Out of Food in the Last Year: Not on file  Transportation Needs:   . Lack of Transportation (Medical): Not on file  . Lack of Transportation (Non-Medical): Not on file  Physical Activity:   . Days of Exercise per Week: Not on file  . Minutes of Exercise per Session: Not on file  Stress:   . Feeling of Stress : Not on file  Social Connections:   . Frequency of Communication with Friends and Family: Not on file  . Frequency of Social Gatherings with Friends and Family: Not on file  . Attends Religious Services: Not on file  . Active Member of Clubs or Organizations: Not on file  . Attends Archivist Meetings: Not on file  . Marital Status: Not on file  Intimate Partner Violence:   . Fear of Current or Ex-Partner: Not on file  . Emotionally  Abused: Not on file  . Physically Abused: Not on file  . Sexually Abused: Not on file   Socially he is married.  He quit smoking 13 years ago.  Family History  Problem Relation Age of Onset  . Cancer - Other Mother   . Breast cancer Mother   . Brain cancer Father   . CAD Brother   . Colon polyps Brother   . Colon cancer Neg Hx   . Esophageal cancer Neg Hx   . Rectal cancer Neg Hx   . Stomach cancer Neg Hx     ROS General: Negative; No fevers, chills, or night sweats HEENT: Negative; No changes in vision or hearing, sinus congestion, difficulty swallowing Pulmonary: Positive for shortness of breath Cardiovascular: See HPI: No chest pain, presyncope, syncope, palpatations GI: Negative; No nausea, vomiting, diarrhea, or abdominal pain GU: Negative; No dysuria, hematuria, or difficulty voiding Musculoskeletal: Positive for occasional low back pain, right hip pain and sciatica discomfort. Hematologic/Oncology: History of throat cancer of left vocal cord, status post laryngectomy in 2005 Endocrine: Negative; no heat/cold intolerance; no diabetes, Neuro: Negative; no changes in balance, headaches Skin: Negative; No rashes or skin lesions Psychiatric: Negative; No behavioral problems, depression Sleep: Negative; No snoring,  daytime sleepiness, hypersomnolence, bruxism, restless legs, hypnogognic hallucinations. Other comprehensive 14 point system review is negative   Physical Exam BP 130/80   Pulse 60   Temp (!) 97.2 F (36.2 C)   Ht _0  (1.803 m)   Wt 162 lb 3.2 oz (73.6 kg)   SpO2 96%   BMI 22.62 kg/m    Repeat blood pressure by me was 124/80  Wt Readings from Last 3 Encounters:  08/30/20 162 lb 3.2 oz (73.6 kg)  05/13/20 162 lb (73.5 kg)  12/23/19 165 lb (74.8 kg)   General: Alert, oriented, no distress.  Skin: normal turgor, no rashes, warm and dry HEENT: Normocephalic, atraumatic. Pupils equal round and reactive to light; sclera anicteric; extraocular muscles  intact;  Nose without nasal septal hypertrophy Neck: No JVD, status post laryngectomy Lungs: clear to ausculatation and percussion; no wheezing or rales Chest wall: without tenderness to palpitation Heart: PMI not displaced, RRR, s1 s2 normal, 1/6 systolic murmur, no diastolic murmur, no rubs, gallops, thrills, or heaves Abdomen: soft, nontender; no hepatosplenomehaly, BS+; abdominal aorta nontender and not dilated by palpation. Back: no CVA tenderness Pulses 2+ Musculoskeletal: full range of motion, normal strength, no joint deformities Extremities: no clubbing cyanosis or edema, Homan's sign negative  Neurologic: grossly nonfocal; Cranial nerves grossly wnl Psychologic: Normal mood and affect   ECG (independently read by me): NSR at 60; mild ST changes inferolaerally  January 03, 2017 ECG (independently read by me): Normal sinus rhythm at 62 bpm.  Left anterior hemiblock.  Inferior Q waves concordant with his prior MI with preserved R waves in lead 2.  Previously noted inferolateral T wave abnormalities.    August 2017 ECG (independently read by me): Sinus bradycardia 57 bpm.  Inferior infarct with Q waves and ST changes.  Left anterior hemiblock.  April 2017 ECG (independently read by me): Normal sinus rhythm at 72 bpm.  T-wave abnormality infererolaterally.  February 2017 ECG (independently read by me): Normal sinus rhythm.  Left anterior hemiblock.  Inferior Q waves with ST- T-wave abnormalities inferolaterally  LABS:  BMP Latest Ref Rng & Units 11/30/2019 12/16/2018 06/13/2018  Glucose 65 - 99 mg/dL 74 88 79  BUN 8 - 27 mg/dL _0 Creatinine 0.76 - 1.27 mg/dL 0.82 0.92 0.93  BUN/Creat Ratio 10 - _1 Sodium 134 - 144 mmol/L 140 138 143  Potassium 3.5 - 5.2 mmol/L 4.7 4.5 4.2  Chloride 96 - 106 mmol/L 101 99 102  CO2 20 - 29 mmol/L _2 Calcium 8.6 - 10.2 mg/dL 9.1 9.4 9.4     Hepatic Function Latest Ref Rng & Units 11/30/2019 12/16/2018 06/13/2018  Total  Protein 6.0 - 8.5 g/dL 6.5 6.7 6.7  Albumin 3.7 - 4.7 g/dL 4.0 4.1 4.3  AST 0 - 40 IU/L _3 ALT 0 - 44 IU/L _4 Alk Phosphatase 39 - 117 IU/L 88 81 74  Total Bilirubin 0.0 - 1.2 mg/dL 0.7 0.6 0.9  Bilirubin, Direct <=0.2 mg/dL - - -    CBC Latest Ref Rng & Units 12/16/2018 06/13/2018 03/10/2018  WBC 3.4 - 10.8 x10E3/uL 7.3 6.0 7.0  Hemoglobin 13.0 - 17.7 g/dL 13.9 14.6 14.0  Hematocrit 37.5 - 51.0 % 40.9 43.0 41.3  Platelets 150 - 450 x10E3/uL 279 229 241   Lab Results  Component Value Date   MCV 89 12/16/2018   MCV 89 06/13/2018   MCV 88 03/10/2018    Lab Results  Component Value Date   TSH 4.060 03/10/2018    BNP No results found for: BNP  ProBNP    Component Value Date/Time   PROBNP 134.4 (H) 04/03/2013 2327     Lipid Panel     Component Value Date/Time   CHOL 144 11/30/2019 0927   TRIG 130 11/30/2019 0927   HDL 52 11/30/2019 0927   CHOLHDL 2.8 11/30/2019 0927   CHOLHDL 2.6 01/04/2017 0851   VLDL 17 01/04/2017 0851   LDLCALC 69 11/30/2019 0927     RADIOLOGY: No results found.  IMPRESSION:  1. Inferior MI [facility]): November 2016, DES stent to RCA   2. Essential hypertension   3. Hyperlipidemia with target LDL less than 70  4. History of laryngectomy   5. History of COVID-19: 2020     ASSESSMENT AND PLAN: Mr. Charnley is a 74 year old WM who is suffered an acute inferior ST segment elevation myocardial infarction secondary to total proximal occlusion of a very large dominant RCA in November 2016.  Subsequent ejection fraction has improved to 50-55% on echocardiography with basal inferior akinesis. He has a history of COPD with emphysematous changes noted on CT scan which may be contributory to some of his shortness of breath.  He continues to be on DAPT and is on Plavix and in the past there was concern of dyspnea contributing by Brilinta.  His last nuclear scan was low risk without evidence for ischemia.  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  His blood pressure today is stable on carvedilol 3.125 mg twice a day.  He is on atorvastatin 80 mg daily and LDL cholesterol in January 2021 was 69.  He had Covid infection in 2020.  Clinically he is doing well.  In 6 months, I have recommended he undergo a 4-year follow-up echo Doppler study, follow-up fasting laboratory and office visit.   Troy Sine, MD, [facility]  09/01/2020 2:47 PM

## 2020-09-01 ENCOUNTER — Encounter: Payer: Self-pay | Admitting: Cardiovascular Disease

## 2020-09-09 ENCOUNTER — Telehealth: Payer: Self-pay | Admitting: *Deleted

## 2020-09-09 NOTE — Telephone Encounter (Signed)
Left message for patient to call back to schedule physical  

## 2020-09-20 ENCOUNTER — Other Ambulatory Visit (HOSPITAL_COMMUNITY): Payer: Medicare HMO

## 2020-11-02 ENCOUNTER — Other Ambulatory Visit: Payer: Self-pay | Admitting: Physician Assistant

## 2020-11-09 DIAGNOSIS — M62838 Other muscle spasm: Secondary | ICD-10-CM | POA: Diagnosis not present

## 2020-11-09 DIAGNOSIS — M5431 Sciatica, right side: Secondary | ICD-10-CM | POA: Diagnosis not present

## 2021-01-04 ENCOUNTER — Encounter: Payer: Self-pay | Admitting: Family Medicine

## 2021-01-04 ENCOUNTER — Ambulatory Visit (INDEPENDENT_AMBULATORY_CARE_PROVIDER_SITE_OTHER): Payer: Medicare HMO | Admitting: Family Medicine

## 2021-01-04 ENCOUNTER — Other Ambulatory Visit: Payer: Self-pay

## 2021-01-04 VITALS — BP 133/89 | HR 81 | Temp 97.4°F | Resp 20 | Ht 71.0 in | Wt 165.2 lb

## 2021-01-04 DIAGNOSIS — I10 Essential (primary) hypertension: Secondary | ICD-10-CM | POA: Diagnosis not present

## 2021-01-04 DIAGNOSIS — Z125 Encounter for screening for malignant neoplasm of prostate: Secondary | ICD-10-CM

## 2021-01-04 DIAGNOSIS — I700 Atherosclerosis of aorta: Secondary | ICD-10-CM

## 2021-01-04 DIAGNOSIS — E785 Hyperlipidemia, unspecified: Secondary | ICD-10-CM

## 2021-01-04 MED ORDER — ALBUTEROL SULFATE HFA 108 (90 BASE) MCG/ACT IN AERS: 1.0000 | INHALATION_SPRAY | RESPIRATORY_TRACT | 5 refills | Status: DC | PRN

## 2021-01-04 MED ORDER — CLOPIDOGREL BISULFATE 75 MG PO TABS
75.0000 mg | ORAL_TABLET | Freq: Every day | ORAL | 3 refills | Status: DC
Start: 2021-01-04 — End: 2022-02-08

## 2021-01-04 MED ORDER — ATORVASTATIN CALCIUM 80 MG PO TABS
ORAL_TABLET | ORAL | 3 refills | Status: DC
Start: 2021-01-04 — End: 2022-01-18

## 2021-01-04 NOTE — Progress Notes (Signed)
Subjective:  Patient ID: Cory Sloan, male    DOB: July 29, 1946  Age: 75 y.o. MRN: [id number]  CC: Hypertension   HPI Cory Sloan presents for  follow-up of hypertension. Patient has no history of headache chest pain or shortness of breath or recent cough. Patient also denies symptoms of TIA such as focal numbness or weakness. Patient denies side effects from medication. States taking it regularly. BP at home usually about 126/84. Systolic briefly went to 149 when the transmission on his pick up truck went out.   Patient in for follow-up of elevated cholesterol. Doing well without complaints on current medication. Denies side effects of statin including myalgia and arthralgia and nausea. Also in today for liver function testing. Currently no chest pain, shortness of breath or other cardiovascular related symptoms noted.  Patient also takes Plavix due to his ASCVD.  He is also been noted to have calcium deposits in the abdominal aorta indicating aortic atherosclerosis.  Depression screen [facility] 2/9 01/04/2021 10/07/2019 12/16/2018 06/13/2018 03/10/2018  Decreased Interest 0 0 0 0 1  Down, Depressed, Hopeless 0 0 0 0 1  PHQ - 2 Score 0 0 0 0 2  Altered sleeping - - - - 1  Tired, decreased energy - - - - 2  Change in appetite - - - - 2  Feeling bad or failure about yourself  - - - - 2  Trouble concentrating - - - - 0  Moving slowly or fidgety/restless - - - - 1  Suicidal thoughts - - - - 0  PHQ-9 Score - - - - 10    History Cory Sloan has a past medical history of Carotid artery stenosis, COPD (chronic obstructive pulmonary disease) (HCC), HLD (hyperlipidemia), ST elevation (STEMI) myocardial infarction involving right coronary artery ([place]) (10/04/2015), STEMI (ST elevation myocardial infarction) [facility]) (November 2016), Throat cancer [facility]), and Tobacco abuse.   He has a past surgical history that includes Appendectomy; Throat surgery (2005); Cardiac catheterization (N/A, 10/04/2015);  Cardiac catheterization (N/A, 10/04/2015); and Colonoscopy (12/23/2019).   His family history includes Brain cancer in his father; Breast cancer in his mother; CAD in his brother; Cancer - Other in his mother; Colon polyps in his brother.He reports that he quit smoking about 17 years ago. His smoking use included cigarettes. He has a 60.00 pack-year smoking history. He has never used smokeless tobacco. He reports that he does not drink alcohol and does not use drugs.  Current Outpatient Medications on File Prior to Visit  Medication Sig Dispense Refill  . aspirin EC 81 MG EC tablet Take 1 tablet (81 mg total) by mouth daily.    . carvedilol (COREG) 3.125 MG tablet TAKE 1 TABLET BY MOUTH 2 (TWO) TIMES DAILY WITH A MEAL. 180 tablet 3  . nitroGLYCERIN (NITROSTAT) 0.4 MG SL tablet Place 1 tablet (0.4 mg total) under the tongue every 5 (five) minutes as needed for chest pain (CP or SOB). 25 tablet 3  . Soft Lens Products (STERILE SALINE) SOLN Use saily to flush trach after use of Inhaler (Breo) 100 mL 10   No current facility-administered medications on file prior to visit.    ROS Review of Systems  Constitutional: Negative for fever.  Respiratory: Negative for shortness of breath.   Cardiovascular: Negative for chest pain.  Musculoskeletal: Negative for arthralgias.  Skin: Negative for rash.    Objective:  BP 133/89   Pulse 81   Temp (!) 97.4 F (36.3 C) (Temporal)   Resp 20  Ht '5\' 11"'$  (1.803 m)   Wt 165 lb 4 oz (75 kg)   SpO2 99%   BMI 23.05 kg/m   BP Readings from Last 3 Encounters:  01/04/21 133/89  08/30/20 130/80  05/13/20 118/80    Wt Readings from Last 3 Encounters:  01/04/21 165 lb 4 oz (75 kg)  08/30/20 162 lb 3.2 oz (73.6 kg)  05/13/20 162 lb (73.5 kg)     Physical Exam Vitals reviewed.  Constitutional:      Appearance: He is well-developed and well-nourished.  HENT:     Head: Normocephalic and atraumatic.     Right Ear: Tympanic membrane and external ear  normal. No decreased hearing noted.     Left Ear: Tympanic membrane and external ear normal. No decreased hearing noted.     Mouth/Throat:     Pharynx: No oropharyngeal exudate or posterior oropharyngeal erythema.  Eyes:     Pupils: Pupils are equal, round, and reactive to light.  Cardiovascular:     Rate and Rhythm: Normal rate and regular rhythm.     Heart sounds: No murmur heard.   Pulmonary:     Effort: No respiratory distress.     Breath sounds: Normal breath sounds.  Abdominal:     General: Bowel sounds are normal.     Palpations: Abdomen is soft. There is no mass.     Tenderness: There is no abdominal tenderness.  Musculoskeletal:     Cervical back: Normal range of motion and neck supple.       Assessment & Plan:   Cory Sloan was seen today for hypertension.  Diagnoses and all orders for this visit:  Essential hypertension -     CBC with Differential/Platelet -     CMP14+EGFR -     Lipid panel  Hyperlipidemia LDL goal <70 -     CBC with Differential/Platelet -     CMP14+EGFR -     Lipid panel  Screening for prostate cancer -     PSA, total and free  Abdominal aortic atherosclerosis (HCC)  Other orders -     atorvastatin (LIPITOR) 80 MG tablet; TAKE 1 TABLET (80 MG TOTAL) BY MOUTH DAILY AT 6 PM. (Needs to be seen before next refill) -     clopidogrel (PLAVIX) 75 MG tablet; Take 1 tablet (75 mg total) by mouth daily. -     albuterol (VENTOLIN HFA) 108 (90 Base) MCG/ACT inhaler; Inhale 1-2 puffs into the lungs every 4 (four) hours as needed for wheezing or shortness of breath (or coughing).   Allergies as of 01/04/2021   No Known Allergies     Medication List       Accurate as of January 04, 2021 11:59 PM. If you have any questions, ask your nurse or doctor.        STOP taking these medications   benzonatate 100 MG capsule Commonly known as: TESSALON Stopped by: Claretta Fraise, MD   doxycycline 100 MG tablet Commonly known as: VIBRA-TABS Stopped  by: Claretta Fraise, MD     TAKE these medications   albuterol 108 (90 Base) MCG/ACT inhaler Commonly known as: VENTOLIN HFA Inhale 1-2 puffs into the lungs every 4 (four) hours as needed for wheezing or shortness of breath (or coughing).   aspirin 81 MG EC tablet Take 1 tablet (81 mg total) by mouth daily.   atorvastatin 80 MG tablet Commonly known as: LIPITOR TAKE 1 TABLET (80 MG TOTAL) BY MOUTH DAILY AT 6 PM. (Needs to be  seen before next refill)   carvedilol 3.125 MG tablet Commonly known as: COREG TAKE 1 TABLET BY MOUTH 2 (TWO) TIMES DAILY WITH A MEAL.   clopidogrel 75 MG tablet Commonly known as: PLAVIX Take 1 tablet (75 mg total) by mouth daily.   nitroGLYCERIN 0.4 MG SL tablet Commonly known as: NITROSTAT Place 1 tablet (0.4 mg total) under the tongue every 5 (five) minutes as needed for chest pain (CP or SOB).   Sterile Saline Soln Use saily to flush trach after use of Inhaler (Breo)       Meds ordered this encounter  Medications  . atorvastatin (LIPITOR) 80 MG tablet    Sig: TAKE 1 TABLET (80 MG TOTAL) BY MOUTH DAILY AT 6 PM. (Needs to be seen before next refill)    Dispense:  90 tablet    Refill:  3  . clopidogrel (PLAVIX) 75 MG tablet    Sig: Take 1 tablet (75 mg total) by mouth daily.    Dispense:  90 tablet    Refill:  3  . albuterol (VENTOLIN HFA) 108 (90 Base) MCG/ACT inhaler    Sig: Inhale 1-2 puffs into the lungs every 4 (four) hours as needed for wheezing or shortness of breath (or coughing).    Dispense:  1 each    Refill:  5      Follow-up: Return in about 6 months (around 07/04/2021) for hypertension.  Claretta Fraise, M.D.

## 2021-01-05 LAB — CMP14+EGFR
ALT: 15 IU/L (ref 0–44)
AST: 22 IU/L (ref 0–40)
Albumin/Globulin Ratio: 1.7 (ref 1.2–2.2)
Albumin: 4.5 g/dL (ref 3.7–4.7)
Alkaline Phosphatase: 93 IU/L (ref 44–121)
BUN/Creatinine Ratio: 13 (ref 10–24)
BUN: 13 mg/dL (ref 8–27)
Bilirubin Total: 0.7 mg/dL (ref 0.0–1.2)
CO2: 24 mmol/L (ref 20–29)
Calcium: 9.5 mg/dL (ref 8.6–10.2)
Chloride: 101 mmol/L (ref 96–106)
Creatinine, Ser: 0.99 mg/dL (ref 0.76–1.27)
GFR calc Af Amer: 87 mL/min/{1.73_m2} (ref >59)
GFR calc non Af Amer: 75 mL/min/{1.73_m2} (ref >59)
Globulin, Total: 2.6 g/dL (ref 1.5–4.5)
Glucose: 94 mg/dL (ref 65–99)
Potassium: 4.7 mmol/L (ref 3.5–5.2)
Sodium: 139 mmol/L (ref 134–144)
Total Protein: 7.1 g/dL (ref 6.0–8.5)

## 2021-01-05 LAB — PSA, TOTAL AND FREE
PSA, Free Pct: 50 %
PSA, Free: 0.5 ng/mL
Prostate Specific Ag, Serum: 1 ng/mL (ref 0.0–4.0)

## 2021-01-05 LAB — LIPID PANEL
Chol/HDL Ratio: 3 ratio (ref 0.0–5.0)
Cholesterol, Total: 155 mg/dL (ref 100–199)
HDL: 51 mg/dL (ref >39)
LDL Chol Calc (NIH): 82 mg/dL (ref 0–99)
Triglycerides: 123 mg/dL (ref 0–149)
VLDL Cholesterol Cal: 22 mg/dL (ref 5–40)

## 2021-01-05 LAB — CBC WITH DIFFERENTIAL/PLATELET
Basophils Absolute: 0.1 10*3/uL (ref 0.0–0.2)
Basos: 1 %
EOS (ABSOLUTE): 0.2 10*3/uL (ref 0.0–0.4)
Eos: 3 %
Hematocrit: 43.5 % (ref 37.5–51.0)
Hemoglobin: 14.7 g/dL (ref 13.0–17.7)
Immature Grans (Abs): 0 10*3/uL (ref 0.0–0.1)
Immature Granulocytes: 0 %
Lymphocytes Absolute: 2.1 10*3/uL (ref 0.7–3.1)
Lymphs: 31 %
MCH: 29.6 pg (ref 26.6–33.0)
MCHC: 33.8 g/dL (ref 31.5–35.7)
MCV: 88 fL (ref 79–97)
Monocytes Absolute: 0.7 10*3/uL (ref 0.1–0.9)
Monocytes: 11 %
Neutrophils Absolute: 3.8 10*3/uL (ref 1.4–7.0)
Neutrophils: 54 %
Platelets: 259 10*3/uL (ref 150–450)
RBC: 4.97 x10E6/uL (ref 4.14–5.80)
RDW: 12 % (ref 11.6–15.4)
WBC: 6.9 10*3/uL (ref 3.4–10.8)

## 2021-01-08 ENCOUNTER — Encounter: Payer: Self-pay | Admitting: Family Medicine

## 2021-01-08 DIAGNOSIS — I10 Essential (primary) hypertension: Secondary | ICD-10-CM | POA: Insufficient documentation

## 2021-01-08 NOTE — Progress Notes (Signed)
Hello Treyshaun,  Your lab result is normal and/or stable.Some minor variations that are not significant are commonly marked abnormal, but do not represent any medical problem for you.  Best regards, Liev Brockbank, M.D.

## 2021-01-11 DIAGNOSIS — I252 Old myocardial infarction: Secondary | ICD-10-CM | POA: Diagnosis not present

## 2021-01-11 DIAGNOSIS — Z008 Encounter for other general examination: Secondary | ICD-10-CM | POA: Diagnosis not present

## 2021-01-11 DIAGNOSIS — I25119 Atherosclerotic heart disease of native coronary artery with unspecified angina pectoris: Secondary | ICD-10-CM | POA: Diagnosis not present

## 2021-01-11 DIAGNOSIS — J449 Chronic obstructive pulmonary disease, unspecified: Secondary | ICD-10-CM | POA: Diagnosis not present

## 2021-01-11 DIAGNOSIS — Z7722 Contact with and (suspected) exposure to environmental tobacco smoke (acute) (chronic): Secondary | ICD-10-CM | POA: Diagnosis not present

## 2021-01-11 DIAGNOSIS — Z7902 Long term (current) use of antithrombotics/antiplatelets: Secondary | ICD-10-CM | POA: Diagnosis not present

## 2021-01-11 DIAGNOSIS — Z7982 Long term (current) use of aspirin: Secondary | ICD-10-CM | POA: Diagnosis not present

## 2021-01-11 DIAGNOSIS — Z809 Family history of malignant neoplasm, unspecified: Secondary | ICD-10-CM | POA: Diagnosis not present

## 2021-01-11 DIAGNOSIS — E785 Hyperlipidemia, unspecified: Secondary | ICD-10-CM | POA: Diagnosis not present

## 2021-01-11 DIAGNOSIS — J383 Other diseases of vocal cords: Secondary | ICD-10-CM | POA: Diagnosis not present

## 2021-01-11 DIAGNOSIS — I10 Essential (primary) hypertension: Secondary | ICD-10-CM | POA: Diagnosis not present

## 2021-02-01 ENCOUNTER — Telehealth: Payer: Self-pay | Admitting: Cardiovascular Disease

## 2021-02-01 NOTE — Telephone Encounter (Signed)
Returned call to wife (DPR) she states that pt has been experiencing chest pain intermittently "for a couple of months" she states that he has not taken his nitro because "not THAT bad". She states that his BP/HR is usually "normal" but they do not take usually. It is 103/68 HR 87 she states that this is normal for him. With these spells he feels fatigued. They last about 20 minutes but they cannot tell me where. CP is exertional and at rest. She states that he is ok for now. I scheduled an appt for 4-12 -22 w/ APP and with TK 05-18-21. Informed wife that we do have a service "after hours" she will CB if continues/worsens. She will take him to the ER if needed.

## 2021-02-01 NOTE — Telephone Encounter (Signed)
Pt c/o of Chest Pain: STAT if CP now or developed within 24 hours  1. Are you having CP right now? No  2. Are you experiencing any other symptoms (ex. SOB, nausea, vomiting, sweating)? SOB  3. How long have you been experiencing CP? Several months  4. Is your CP continuous or coming and going? Coming and going  5. Have you taken Nitroglycerin? No  ?

## 2021-02-03 NOTE — Telephone Encounter (Signed)
I would arrange for Mr. Cory Sloan to have his APP appointment over the next week or 2 the most and not wait until April 12

## 2021-02-03 NOTE — Telephone Encounter (Signed)
Spoke with wife and rescheduled appointment for 3/23 @ 8:20 am.

## 2021-02-08 ENCOUNTER — Encounter: Payer: Self-pay | Admitting: Cardiovascular Disease

## 2021-02-08 ENCOUNTER — Ambulatory Visit: Payer: Medicare HMO | Admitting: Cardiovascular Disease

## 2021-02-08 ENCOUNTER — Other Ambulatory Visit: Payer: Self-pay

## 2021-02-08 DIAGNOSIS — J449 Chronic obstructive pulmonary disease, unspecified: Secondary | ICD-10-CM | POA: Diagnosis not present

## 2021-02-08 DIAGNOSIS — Z8616 Personal history of COVID-19: Secondary | ICD-10-CM

## 2021-02-08 DIAGNOSIS — Z9002 Acquired absence of larynx: Secondary | ICD-10-CM

## 2021-02-08 DIAGNOSIS — I2510 Atherosclerotic heart disease of native coronary artery without angina pectoris: Secondary | ICD-10-CM

## 2021-02-08 DIAGNOSIS — I2119 ST elevation (STEMI) myocardial infarction involving other coronary artery of inferior wall: Secondary | ICD-10-CM | POA: Diagnosis not present

## 2021-02-08 DIAGNOSIS — E785 Hyperlipidemia, unspecified: Secondary | ICD-10-CM

## 2021-02-08 DIAGNOSIS — I10 Essential (primary) hypertension: Secondary | ICD-10-CM

## 2021-02-08 DIAGNOSIS — R079 Chest pain, unspecified: Secondary | ICD-10-CM | POA: Diagnosis not present

## 2021-02-08 MED ORDER — EZETIMIBE 10 MG PO TABS: 10.0000 mg | ORAL_TABLET | Freq: Every day | ORAL | 3 refills | Status: DC

## 2021-02-08 NOTE — Patient Instructions (Addendum)
Medication Instructions:  START- Zetia 10 mg by mouth daily  *If you need a refill on your cardiac medications before your next appointment, please call your pharmacy*   Lab Work: None Ordered   Testing/Procedures: A chest x-ray takes a picture of the organs and structures inside the chest, including the heart, lungs, and blood vessels. This test can show several things, including, whether the heart is enlarges; whether fluid is building up in the lungs; and whether pacemaker / defibrillator leads are still in place.   Follow-Up: At [facility], you and your health needs are our priority.  As part of our continuing mission to provide you with exceptional heart care, we have created designated Provider Care Teams.  These Care Teams include your primary Cardiologist (physician) and Advanced Practice Providers (APPs -  Physician Assistants and Nurse Practitioners) who all work together to provide you with the care you need, when you need it.  We recommend signing up for the patient portal called "MyChart".  Sign up information is provided on this After Visit Summary.  MyChart is used to connect with patients for Virtual Visits (Telemedicine).  Patients are able to view lab/test results, encounter notes, upcoming appointments, etc.  Non-urgent messages can be sent to your provider as well.   To learn more about what you can do with MyChart, go to [email or web address].    Your next appointment:   Keep appointment with Dr [name] in June  The format for your next appointment:   In Person

## 2021-02-08 NOTE — Progress Notes (Signed)
Patient ID: Cory Sloan, male   DOB: 1945/12/08, 75 y.o.   MRN: [id number]      HPI: Cory Sloan is a 75 y.o. male who presents to the office today for a cardiology evaluation for being worked in for complaints of sharp left pain.  Cory Sloan presented to [facility] 10/04/2015 with an acute ST segment elevation myocardial infarction secondary to total proximal occlusion of a large, dominant RCA.  He had a prior history of tobacco use and COPD in 2005 underwent laryngectomy for laryngeal CA.  There was no prior history of ischemic heart disease.  He was taken emergently to the catheterization laboratory and underwent successful PCI/DES stenting of a large dominant RCA with ultimate insertion of a 4.032 mm Synergy DES stent postdilated to 4.6 mm with the 100 percent occlusion reduced to 0% and restoration of brisk TIMI-3 flow.  Initial ejection fraction was 50% with mild mid inferior wall hypocontractility.  Post catheterization he developed cardiogenic shock and hypotension, which initially was refractory to IV fluid resuscitation and required dopamine.  A chest CT showed emphysematous disease.  He was seen by Cory Sloan on 2 occasions post hospitalization with the most recent being in December 2016.  An echo Doppler study at that time showed an EF of 50-55% with akinesis of the basal inferior myocardium and grade 1 diastolic dysfunction.  There was mild aortic sclerosis, mild mitral and trivial tricuspid regurgitation.  Blood pressure has been low and as result, he is only been able to be on very minimal medical therapy consisting of carvedilol one half of a 3.125 mg pill twice a day.  Initially he was on Brilinta but this was changed to Plavix due to shortness of breath issues which were felt possibly contributed to Brilinta.  A P2Y12 blood test was 210.  When I saw him in 2017, his LDL had risen to 146 and at that time recommended the addition of Zetia to his atorvastatin.  Apparently  he had not taken the atorvastatin for month prior to that blood work.  As result, he resume taking the atorvastatin at 80 mg daily and never started the Zetia.    He was evaluated in the emergency room on 03/10/2017 with chest pain.  The day prior to this episode, he had been pulling heavy dishes.  The following night he noticed a retrosternal chest discomfort that was different from his prior cardiac pain.  He presented to the emergency room.  Troponin was negative.  He underwent a CT angios an incidental finding of a right common iliac artery dissection in a short segment without associated decrease in pulse, pain, or paresthesias was notified.  The images were subtle.  Reviewed by Dr. Donzetta Matters of vascular surgery who felt that the imaging did not represent dissection.  It was felt that the patient's chest pain was due to his heavy lifting the day previously.  An echo Doppler study was done during that evaluation, which showed an EF of 55-60%. There was grade 1 diastolic dysfunction.  There is mild left atrial dilatation.  He underwent a nuclear stress test on 03/20/2017.  This was a low risk study showing an EF of 46%.  Findings were consistent with prior MI.  There was no ischemia.  Subsequently, he has been without any chest pain.  He continues to be active.  He denies shortness of breath.    I saw him in May 2018, subsequently, he was evaluated by several falls or APP's and  saw Cory Sloan, [place] in January 2021 and Cory Memos, NP in June 2021.  He continues to feel well.  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  He had Covid in 2020.    I saw him for 3-year follow-up evaluation in October 2021.  At that time he denied any chest pain or significant shortness of breath and was unaware of palpitations.  Cory Sloan in January 2021 showed an LDL cholesterol at 69.  During that evaluation, he was clinically stable and I recommended he undergo a 4-year follow-up echo Doppler  study particularly in follow-up of his Covid infection.  Cory Sloan has not yet had his echocardiogram which is scheduled in early April.  However he had called the office complaining of very sharp left-sided stabbing chest pain at the left axillary line near the diaphragm pleural space.  The pain was instantaneous but almost brought tears to his eyes.  He denies any exertional chest discomfort.  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  He does not recall when his last chest x-ray was.  His primary physician had recheck laboratory in February 2022 and LDL cholesterol had risen to 82 despite taking atorvastatin 80 mg.  He continues to be on carvedilol 3.125 mg twice a day and has been on long-term DAPT with aspirin/Plavix.  He presents for evaluation.  Past Medical History:  Diagnosis Date  . Carotid artery stenosis    1-39% bilateral stenosis  . COPD (chronic obstructive pulmonary disease) ([place])   . HLD (hyperlipidemia)   . ST elevation (STEMI) myocardial infarction involving right coronary artery ([place]) 10/04/2015  . STEMI (ST elevation myocardial infarction) [facility]) November 2016   with PCI to RCA and associated cardiogenic shock  . 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 Tobacco abuse     Past Surgical History:  Procedure Laterality Date  . APPENDECTOMY    . CARDIAC CATHETERIZATION N/A 10/04/2015   Procedure: Left Heart Cath and Coronary Angiography;  Surgeon: Troy Sine, MD;  Location: [place] [facility];  Service: Cardiovascular;  Laterality: N/A;  . CARDIAC CATHETERIZATION N/A 10/04/2015   Procedure: Coronary Stent Intervention;  Surgeon: Troy Sine, MD;  Location: [place] [facility];  Service: Cardiovascular;  Laterality: N/A;  . COLONOSCOPY  12/23/2019  . THROAT SURGERY  2005   [place]    No Known Allergies  Current Outpatient Medications  Medication Sig Dispense Refill  . albuterol (VENTOLIN  HFA) 108 (90 Base) MCG/ACT inhaler Inhale 1-2 puffs into the lungs every 4 (four) hours as needed for wheezing or shortness of breath (or coughing). 1 each 5  . aspirin EC 81 MG EC tablet Take 1 tablet (81 mg total) by mouth daily.    Marland Kitchen atorvastatin (LIPITOR) 80 MG tablet TAKE 1 TABLET (80 MG TOTAL) BY MOUTH DAILY AT 6 PM. (Needs to be seen before next refill) 90 tablet 3  . carvedilol (COREG) 3.125 MG tablet TAKE 1 TABLET BY MOUTH 2 (TWO) TIMES DAILY WITH A MEAL. 180 tablet 3  . clopidogrel (PLAVIX) 75 MG tablet Take 1 tablet (75 mg total) by mouth daily. 90 tablet 3  . ezetimibe (ZETIA) 10 MG tablet Take 1 tablet (10 mg total) by mouth daily. 90 tablet 3  . nitroGLYCERIN (NITROSTAT) 0.4 MG SL tablet Place 1 tablet (0.4 mg total) under the tongue every 5 (  five) minutes as needed for chest pain (CP or SOB). 25 tablet 3  . Soft Lens Products (STERILE SALINE) SOLN Use saily to flush trach after use of Inhaler (Breo) 100 mL 10   No current facility-administered medications for this visit.    Social History   Socioeconomic History  . Marital status: Married    Spouse name: Not on file  . Number of children: Not on file  . Years of education: Not on file  . Highest education level: Not on file  Occupational History  . Occupation: retired  Tobacco Use  . Smoking status: Former Smoker    Packs/day: 1.50    Years: 40.00    Pack years: 60.00    Types: Cigarettes    Quit date: 08/20/2003    Years since quitting: 17.4  . Smokeless tobacco: Never Used  Vaping Use  . Vaping Use: Never used  Substance and Sexual Activity  . Alcohol use: No    Alcohol/week: 0.0 standard drinks  . Drug use: No  . Sexual activity: Not Currently  Other Topics Concern  . Not on file  Social History Narrative  . Not on file   Social Determinants of Health   Financial Resource Strain: Not on file  Food Insecurity: Not on file  Transportation Needs: Not on file  Physical Activity: Not on file  Stress: Not  on file  Social Connections: Not on file  Intimate Partner Violence: Not on file   Socially he is married.  He quit smoking 19 years ago.  Family History  Problem Relation Age of Onset  . Cancer - Other Mother   . Breast cancer Mother   . Brain cancer Father   . CAD Brother   . Colon polyps Brother   . Colon cancer Neg Hx   . Esophageal cancer Neg Hx   . Rectal cancer Neg Hx   . Stomach cancer Neg Hx     ROS General: Negative; No fevers, chills, or night sweats HEENT: Negative; No changes in vision or hearing, sinus congestion, difficulty swallowing Pulmonary: Positive for shortness of breath Cardiovascular: see HPI GI: Negative; No nausea, vomiting, diarrhea, or abdominal pain GU: Negative; No dysuria, hematuria, or difficulty voiding Musculoskeletal: Positive for occasional low back pain, right hip pain and sciatica discomfort. Hematologic/Oncology: History of throat cancer of left vocal cord, status post laryngectomy in 2005 Endocrine: Negative; no heat/cold intolerance; no diabetes, Neuro: Negative; no changes in balance, headaches Skin: Negative; No rashes or skin lesions Psychiatric: Negative; No behavioral problems, depression Sleep: Negative; No snoring,  daytime sleepiness, hypersomnolence, bruxism, restless legs, hypnogognic hallucinations. Other comprehensive 14 point system review is negative   Physical Exam BP 130/88 (BP Location: Left Arm, Patient Position: Sitting)   Pulse (!) 56   Ht _0  (1.778 m)   Wt 162 lb (73.5 kg)   SpO2 95%   BMI 23.24 kg/m    Repeat blood pressure by me was 118/80  Wt Readings from Last 3 Encounters:  02/08/21 162 lb (73.5 kg)  01/04/21 165 lb 4 oz (75 kg)  08/30/20 162 lb 3.2 oz (73.6 kg)   General: Alert, oriented, no distress.  Skin: normal turgor, no rashes, warm and dry HEENT: Normocephalic, atraumatic. Pupils equal round and reactive to light; sclera anicteric; extraocular muscles intact;  Nose without nasal  septal hypertrophy Mouth/Parynx benign; Mallinpatti scale 3 Neck: No JVD, status post laryngectomy Lungs: clear to ausculatation and percussion; no wheezing or rales Chest wall: No chest wall  tenderness to palpation over the left ribs Heart: PMI not displaced, RRR, s1 s2 normal, 1/6 systolic murmur, no diastolic murmur, no rubs, gallops, thrills, or heaves Abdomen: soft, nontender; no hepatosplenomehaly, BS+; abdominal aorta nontender and not dilated by palpation. Back: no CVA tenderness Pulses 2+ Musculoskeletal: full range of motion, normal strength, no joint deformities Extremities: no clubbing cyanosis or edema, Homan's sign negative  Neurologic: grossly nonfocal; Cranial nerves grossly wnl Psychologic: Normal mood and affect  ECG (independently read by me): Sinus bradycardia 56 bpm, left axis deviation.  Previously noted anterolateral T wave abnormalities not significantly changed from prior tracings.  Normal intervals  October 2021 ECG (independently read by me): NSR at 60; mild ST changes inferolaerally  January 03, 2017 ECG (independently read by me): Normal sinus rhythm at 62 bpm.  Left anterior hemiblock.  Inferior Q waves concordant with his prior MI with preserved R waves in lead 2.  Previously noted inferolateral T wave abnormalities.    August 2017 ECG (independently read by me): Sinus bradycardia 57 bpm.  Inferior infarct with Q waves and ST changes.  Left anterior hemiblock.  April 2017 ECG (independently read by me): Normal sinus rhythm at 72 bpm.  T-wave abnormality infererolaterally.  February 2017 ECG (independently read by me): Normal sinus rhythm.  Left anterior hemiblock.  Inferior Q waves with ST- T-wave abnormalities inferolaterally  LABS:  BMP Latest Ref Rng & Units 01/04/2021 11/30/2019 12/16/2018  Glucose 65 - 99 mg/dL 94 74 88  BUN 8 - 27 mg/dL _0 Creatinine 0.76 - 1.27 mg/dL 0.99 0.82 0.92  BUN/Creat Ratio 10 - _1 Sodium 134 - 144 mmol/L  139 140 138  Potassium 3.5 - 5.2 mmol/L 4.7 4.7 4.5  Chloride 96 - 106 mmol/L 101 101 99  CO2 20 - 29 mmol/L _2 Calcium 8.6 - 10.2 mg/dL 9.5 9.1 9.4     Hepatic Function Latest Ref Rng & Units 01/04/2021 11/30/2019 12/16/2018  Total Protein 6.0 - 8.5 g/dL 7.1 6.5 6.7  Albumin 3.7 - 4.7 g/dL 4.5 4.0 4.1  AST 0 - 40 IU/L _3 ALT 0 - 44 IU/L _4 Alk Phosphatase 44 - 121 IU/L 93 88 81  Total Bilirubin 0.0 - 1.2 mg/dL 0.7 0.7 0.6  Bilirubin, Direct <=0.2 mg/dL - - -    CBC Latest Ref Rng & Units 01/04/2021 12/16/2018 06/13/2018  WBC 3.4 - 10.8 x10E3/uL 6.9 7.3 6.0  Hemoglobin 13.0 - 17.7 g/dL 14.7 13.9 14.6  Hematocrit 37.5 - 51.0 % 43.5 40.9 43.0  Platelets 150 - 450 x10E3/uL 259 279 229   Lab Results  Component Value Date   MCV 88 01/04/2021   MCV 89 12/16/2018   MCV 89 06/13/2018    Lab Results  Component Value Date   TSH 4.060 03/10/2018    BNP No results found for: BNP  ProBNP    Component Value Date/Time   PROBNP 134.4 (H) 04/03/2013 2327     Lipid Panel     Component Value Date/Time   CHOL 155 01/04/2021 1146   TRIG 123 01/04/2021 1146   HDL 51 01/04/2021 1146   CHOLHDL 3.0 01/04/2021 1146   CHOLHDL 2.6 01/04/2017 0851   VLDL 17 01/04/2017 0851   LDLCALC 82 01/04/2021 1146     RADIOLOGY: No results found.  IMPRESSION:  1. Inferior MI [facility]): November 2016, DES stent to RCA   2. CAD in native artery  3. Essential hypertension   4. Chest pain of uncertain etiology   5. Hyperlipidemia with target LDL less than 70   6. Chronic obstructive pulmonary disease, unspecified COPD type ([place])   7. History of COVID-19: 2020   8. History of laryngectomy     ASSESSMENT AND PLAN: Cory Sloan is a 75 year old WM who is suffered an acute inferior ST segment elevation myocardial infarction secondary to total proximal occlusion of a very large dominant RCA in November 2016.  Subsequent ejection fraction has improved to 50-55% on echocardiography  with basal inferior akinesis. He has a history of COPD with emphysematous changes noted on CT scan which may be contributory to some of his shortness of breath.  He continues to be on DAPT and is on Plavix and in the past there was concern of dyspnea contributing by Brilinta.  His last nuclear scan was low risk without evidence for ischemia.  When I saw him in October 2021 after not having seen him since 2018, he was fairly stable from a cardiac standpoint.  He was still working hard climbing trees business.  His blood pressure was stable.  He had suffered a significant Covid infection in 2020.  He is scheduled to undergo his 2D echo Doppler study in early April.  Recently he developed very sharp episodes of stabbing pleuritic-like chest pain which lasted seconds.  However it was very intense.  Upon further questioning he denies any exertional chest tightness or pressure and he still able to climb trees without problem.  I suspect his sharp stabbing chest pain most likely is either musculoskeletal in etiology or pleuritic.  With his prior long tobacco history and COPD I have recommended he undergo a PA and lateral chest x-ray.  I also reviewed laboratory from his primary physician.  With his known CAD and his LDL cholesterol increasing to 82 I am recommending the addition of Zetia 10 mg to be added to his atorvastatin 80 mg.  He continues to be on DAPT and is without bleeding or anginal type symptoms.  He is is maintained on low-dose carvedilol at 3.125 mg twice a day and ECG shows sinus bradycardia and previous ST segment changes with prior inferior Q-wave in lead III consistent with his previous MI.  We will contact him regarding the results of his chest x-ray as well as upcoming echo.  He already has an appointment to see me in June and I will see him for follow-up evaluation at that time or sooner if problems arise.   Troy Sine, MD, [facility]  02/08/2021 9:16 AM

## 2021-02-20 ENCOUNTER — Other Ambulatory Visit: Payer: Self-pay

## 2021-02-20 ENCOUNTER — Ambulatory Visit (HOSPITAL_COMMUNITY): Payer: Medicare HMO | Attending: Internal Medicine

## 2021-02-20 DIAGNOSIS — I10 Essential (primary) hypertension: Secondary | ICD-10-CM | POA: Diagnosis not present

## 2021-02-20 LAB — ECHOCARDIOGRAM COMPLETE
Area-P 1/2: 2.37 cm2
S' Lateral: 3.2 cm

## 2021-02-28 ENCOUNTER — Ambulatory Visit: Payer: Medicare HMO | Admitting: Student

## 2021-05-18 ENCOUNTER — Ambulatory Visit: Payer: Medicare HMO | Admitting: Cardiovascular Disease

## 2021-05-19 ENCOUNTER — Telehealth: Payer: Self-pay | Admitting: Cardiovascular Disease

## 2021-05-19 NOTE — Telephone Encounter (Signed)
Pt c/o of Chest Pain: 1. Are you having CP right now? No  2. Are you experiencing any other symptoms (ex. SOB, nausea, vomiting, sweating)? SOB 3. How long have you been experiencing CP?  Been going on 4. Is your CP continuous or coming and going? Coming and  going 5. Have you taken Nitroglycerin? No     Pt c/o BP issue: STAT if pt c/o blurred vision, one-sided weakness or slurred speech  1. What are your last 5 BP readings? 141/119  2. Are you having any other symptoms (ex. Dizziness, headache, blurred vision, passed out)? stomach  3. What is your BP issue? Is high    STAT if HR is under 50 or over 120 (normal HR is 60-100 beats per minute)  What is your heart rate? Over 100  Do you have a log of your heart rate readings (document readings)? No   Do you have any other symptoms? Running high

## 2021-05-19 NOTE — Telephone Encounter (Signed)
Called patient, spoke to wife. She states that he had an episode last night of Chest pains, she said it lasted about 10 minutes, but then went away. BP spiked to 141/119, but then after chest pain episode it went away and BP now this morning was 117/80. HR was back to normal- did not give reading. Wife states that he has had these coming and going chest pains for a while now but this was the worst one he has had in a while. He does not have SOB now- did with episode last night. No changes to diet or medications. They did not give him nitroglycerin to try, but I did recommend if he had another episode to try the nitroglycerin and seek ED care. Patient wife verbalized understanding, she was advised to keep appointment to discuss further with [facility] on Tuesday 07/05. Will make MD aware for upcoming appointment.  Thanks!

## 2021-05-23 ENCOUNTER — Other Ambulatory Visit: Payer: Self-pay

## 2021-05-23 ENCOUNTER — Ambulatory Visit: Payer: Medicare HMO | Admitting: Cardiovascular Disease

## 2021-05-23 ENCOUNTER — Encounter: Payer: Self-pay | Admitting: Cardiovascular Disease

## 2021-05-23 DIAGNOSIS — R0789 Other chest pain: Secondary | ICD-10-CM

## 2021-05-23 DIAGNOSIS — I2510 Atherosclerotic heart disease of native coronary artery without angina pectoris: Secondary | ICD-10-CM

## 2021-05-23 DIAGNOSIS — I2119 ST elevation (STEMI) myocardial infarction involving other coronary artery of inferior wall: Secondary | ICD-10-CM

## 2021-05-23 DIAGNOSIS — J449 Chronic obstructive pulmonary disease, unspecified: Secondary | ICD-10-CM | POA: Diagnosis not present

## 2021-05-23 DIAGNOSIS — E785 Hyperlipidemia, unspecified: Secondary | ICD-10-CM | POA: Diagnosis not present

## 2021-05-23 DIAGNOSIS — Z9002 Acquired absence of larynx: Secondary | ICD-10-CM | POA: Diagnosis not present

## 2021-05-23 DIAGNOSIS — Z8616 Personal history of COVID-19: Secondary | ICD-10-CM

## 2021-05-23 NOTE — Patient Instructions (Signed)
Medication Instructions:  No changes  *If you need a refill on your cardiac medications before your next appointment, please call your pharmacy*   Lab Work: No Labs If you have labs (blood work) drawn today and your tests are completely normal, you will receive your results only by: [place] (if you have MyChart) OR A paper copy in the mail If you have any lab test that is abnormal or we need to change your treatment, we will call you to review the results.   Testing/Procedures: No Testing    Follow-Up: At [facility], you and your health needs are our priority.  As part of our continuing mission to provide you with exceptional heart care, we have created designated Provider Care Teams.  These Care Teams include your primary Cardiologist (physician) and Advanced Practice Providers (APPs -  Physician Assistants and Nurse Practitioners) who all work together to provide you with the care you need, when you need it.  We recommend signing up for the patient portal called "MyChart".  Sign up information is provided on this After Visit Summary.  MyChart is used to connect with patients for Virtual Visits (Telemedicine).  Patients are able to view lab/test results, encounter notes, upcoming appointments, etc.  Non-urgent messages can be sent to your provider as well.   To learn more about what you can do with MyChart, go to [email or web address].    Your next appointment:   6 month(s)  The format for your next appointment:   In Person  Provider:   Shelva Majestic, MD

## 2021-05-23 NOTE — Progress Notes (Signed)
Patient ID: Cory Sloan, male   DOB: 1945/12/25, 75 y.o.   MRN: [id number]      HPI: Cory Sloan is a 75 y.o. male who presents to the office today for a 4 month follow-up cardiology evaluation.  [name] presented to [facility] 10/04/2015 with an acute ST segment elevation myocardial infarction secondary to total proximal occlusion of a large, dominant RCA.  He had a prior history of tobacco use and COPD in 2005 underwent laryngectomy for laryngeal CA.  There was no prior history of ischemic heart disease.  He was taken emergently to the catheterization laboratory and underwent successful PCI/DES stenting of a large dominant RCA with ultimate insertion of a 4.032 mm Synergy DES stent postdilated to 4.6 mm with the 100 percent occlusion reduced to 0% and restoration of brisk TIMI-3 flow.  Initial ejection fraction was 50% with mild mid inferior wall hypocontractility.  Post catheterization he developed cardiogenic shock and hypotension, which initially was refractory to IV fluid resuscitation and required dopamine.  A chest CT showed emphysematous disease.  He was seen by [name] on 2 occasions post hospitalization with the most recent being in December 2016.  An echo Doppler study at that time showed an EF of 50-55% with akinesis of the basal inferior myocardium and grade 1 diastolic dysfunction.  There was mild aortic sclerosis, mild mitral and trivial tricuspid regurgitation.  Blood pressure has been low and as result, he is only been able to be on very minimal medical therapy consisting of carvedilol one half of a 3.125 mg pill twice a day.  Initially he was on Brilinta but this was changed to Plavix due to shortness of breath issues which were felt possibly contributed to Brilinta.  A P2Y12 blood test was 210.  When I saw him in 2017, his LDL had risen to 146 and at that time recommended the addition of Zetia to his atorvastatin.  Apparently he had not taken the atorvastatin for  month prior to that blood work.  As result, he resume taking the atorvastatin at 80 mg daily and never started the Zetia.    He was evaluated in the emergency room on 03/10/2017 with chest pain.  The day prior to this episode, he had been pulling heavy dishes.  The following night he noticed a retrosternal chest discomfort that was different from his prior cardiac pain.  He presented to the emergency room.  Troponin was negative.  He underwent a CT angios an incidental finding of a right common iliac artery dissection in a short segment without associated decrease in pulse, pain, or paresthesias was notified.  The images were subtle.  Reviewed by Dr. Donzetta Matters of vascular surgery who felt that the imaging did not represent dissection.  It was felt that the patient's chest pain was due to his heavy lifting the day previously.  An echo Doppler study was done during that evaluation, which showed an EF of 55-60%. There was grade 1 diastolic dysfunction.  There is mild left atrial dilatation.  He underwent a nuclear stress test on 03/20/2017.  This was a low risk study showing an EF of 46%.  Findings were consistent with prior MI.  There was no ischemia.  Subsequently, he has been without any chest pain.  He continues to be active.  He denies shortness of breath.    I saw him in May 2018, subsequently, he was evaluated by several falls or APP's and saw [name], [place] in January 2021  and [name], NP in June 2021.  He continues to feel well.  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  He had Covid in 2020.    I saw him for 3-year follow-up evaluation in October 2021.  At that time he denied any chest pain or significant shortness of breath and was unaware of palpitations.  [name] in January 2021 showed an LDL cholesterol at 69.  During that evaluation, he was clinically stable and I recommended he undergo a 4-year follow-up echo Doppler study particularly in follow-up of his  Covid infection.  I last saw him on February 08, 2021 when he was worked in with complaints of very sharp left-sided stabbing chest pain at the left axillary line near the diaphragm pleural space.  The pain was instantaneous but almost brought tears to his eyes. [name] has not yet had his echocardiogram which was scheduled in early April.  He denied any exertional chest discomfort.  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  He does not recall when his last chest x-ray was.  His primary physician had rechecked laboratory in February 2022 and LDL cholesterol had risen to 82 despite taking atorvastatin 80 mg.  He continues to be on carvedilol 3.125 mg twice a day and has been on long-term DAPT with aspirin/Plavix.  During that evaluation, I felt his chest pain was most likely musculoskeletal in etiology or possibly pleuritic.  I recommended he undergo a PA lateral chest x-ray.  In addition with his CAD I recommended the addition of Zetia 10 mg to be added to his atorvastatin 80 mg with target LDL less than 70.  An echo Doppler study in February 20, 2021 showed normal LV function with EF 55 to 60%.  There was mild mitral gravitation, mild to moderate aortic valve sclerosis without stenosis.    Presently, he feels well.  He is not having any exertional chest pain.  He continues to note a very rare episodic sharp chest pain which immediately resolves.  He continues to climb up and down trees.  He denies presyncope or syncope.   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   Tobacco abuse     Past Surgical History:  Procedure Laterality Date   APPENDECTOMY     CARDIAC CATHETERIZATION N/A 10/04/2015   Procedure: Left Heart Cath and Coronary Angiography;  Surgeon: Troy Sine, MD;  Location: [place] [facility];  Service: Cardiovascular;  Laterality: N/A;   CARDIAC CATHETERIZATION N/A 10/04/2015   Procedure: Coronary Stent Intervention;  Surgeon: Troy Sine, MD;  Location: [place] [facility];  Service: Cardiovascular;  Laterality: N/A;   COLONOSCOPY  12/23/2019   THROAT SURGERY  2005   [place]    No Known Allergies  Current Outpatient Medications  Medication Sig Dispense Refill   albuterol (VENTOLIN HFA) 108 (90 Base) MCG/ACT inhaler Inhale 1-2 puffs into the lungs every 4 (four) hours as needed for wheezing or shortness of breath (or coughing).  1 each 5   aspirin EC 81 MG EC tablet Take 1 tablet (81 mg total) by mouth daily.     atorvastatin (LIPITOR) 80 MG tablet TAKE 1 TABLET (80 MG TOTAL) BY MOUTH DAILY AT 6 PM. (Needs to be seen before next refill) 90 tablet 3   carvedilol (COREG) 3.125 MG tablet TAKE 1 TABLET BY MOUTH 2 (TWO) TIMES DAILY WITH A MEAL. 180 tablet 3   clopidogrel (PLAVIX) 75 MG tablet Take 1 tablet (75 mg total) by mouth daily. 90 tablet 3   nitroGLYCERIN (NITROSTAT) 0.4 MG SL tablet Place 1 tablet (0.4 mg total) under the tongue every 5 (five) minutes as needed for chest pain (CP or SOB). 25 tablet 3   Soft Lens Products (STERILE SALINE) SOLN Use saily to flush trach after use of Inhaler (Breo) 100 mL 10   ezetimibe (ZETIA) 10 MG tablet Take 1 tablet (10 mg total) by mouth daily. 90 tablet 3   No current facility-administered medications for this visit.    Social History   Socioeconomic History   Marital status: Married    Spouse name: Not on file   Number of children: Not on file   Years of education: Not on file   Highest education level: Not on file  Occupational History   Occupation: retired   Tobacco Use   Smoking status: Former    Packs/day: 1.50    Years: 40.00    Pack years: 60.00    Types: Cigarettes    Quit date: 08/20/2003    Years since quitting: 17.7   Smokeless tobacco: Never  Vaping Use   Vaping Use: Never used  Substance and Sexual Activity   Alcohol use: No    Alcohol/week: 0.0 standard drinks   Drug use: No   Sexual activity: Not Currently  Other Topics Concern   Not on file  Social History Narrative   Not on file   Social Determinants of Health   Financial Resource Strain: Not on file  Food Insecurity: Not on file  Transportation Needs: Not on file  Physical Activity: Not on file  Stress: Not on file  Social Connections: Not on file  Intimate Partner Violence: Not on file   Socially he is married.  He quit smoking 19 years ago.  Family History  Problem Relation Age of Onset   Cancer - Other Mother    Breast cancer Mother    Brain cancer Father    CAD Brother    Colon polyps Brother    Colon cancer Neg Hx    Esophageal cancer Neg Hx    Rectal cancer Neg Hx    Stomach cancer Neg Hx     ROS General: Negative; No fevers, chills, or night sweats HEENT: Negative; No changes in vision or hearing, sinus congestion, difficulty swallowing Pulmonary: Positive for shortness of breath Cardiovascular: see HPI GI: Negative; No nausea, vomiting, diarrhea, or abdominal pain GU: Negative; No dysuria, hematuria, or difficulty voiding Musculoskeletal: Positive for occasional low back pain, right hip pain and sciatica discomfort. Hematologic/Oncology: History of throat cancer of left vocal cord, status post laryngectomy in 2005 Endocrine: Negative; no heat/cold intolerance; no diabetes, Neuro: Negative; no changes in balance, headaches Skin: Negative; No rashes or skin lesions Psychiatric: Negative; No behavioral problems, depression Sleep: Negative; No snoring,  daytime sleepiness, hypersomnolence, bruxism, restless legs, hypnogognic  hallucinations. Other comprehensive 14 point system review is negative   Physical Exam BP 117/70   Pulse 63   Ht $R'5\' 10"'ME$  (  1.778 m)   Wt 165 lb 3.2 oz (74.9 kg)   SpO2 94%   BMI 23.70 kg/m    Repeat blood pressure by me was 112/70  Wt Readings from Last 3 Encounters:  05/23/21 165 lb 3.2 oz (74.9 kg)  02/08/21 162 lb (73.5 kg)  01/04/21 165 lb 4 oz (75 kg)   General: Alert, oriented, no distress.  Skin: normal turgor, no rashes, warm and dry HEENT: Normocephalic, atraumatic. Pupils equal round and reactive to light; sclera anicteric; extraocular muscles intact;  Nose without nasal septal hypertrophy Mouth/Parynx benign; Mallinpatti scale Neck: Status post laryngectomy; no JVD Lungs: clear to ausculatation and percussion; no wheezing or rales Chest wall: without tenderness to palpitation Heart: PMI not displaced, RRR, s1 s2 normal, 1/6 systolic murmur, no diastolic murmur, no rubs, gallops, thrills, or heaves Abdomen: soft, nontender; no hepatosplenomehaly, BS+; abdominal aorta nontender and not dilated by palpation. Back: no CVA tenderness Pulses 2+ Musculoskeletal: full range of motion, normal strength, no joint deformities Extremities: no clubbing cyanosis or edema, Homan's sign negative  Neurologic: grossly nonfocal; Cranial nerves grossly wnl Psychologic: Normal mood and affect   ECG (independently read by me): NSR at 63; Mild inferolateral T changes  February 08, 2021 ECG (independently read by me): Sinus bradycardia 56 bpm, left axis deviation.  Previously noted anterolateral T wave abnormalities not significantly changed from prior tracings.  Normal intervals  October 2021 ECG (independently read by me): NSR at 60; mild ST changes inferolaerally  January 03, 2017 ECG (independently read by me): Normal sinus rhythm at 62 bpm.  Left anterior hemiblock.  Inferior Q waves concordant with his prior MI with preserved R waves in lead 2.  Previously noted inferolateral T wave  abnormalities.    August 2017 ECG (independently read by me): Sinus bradycardia 57 bpm.  Inferior infarct with Q waves and ST changes.  Left anterior hemiblock.  April 2017 ECG (independently read by me): Normal sinus rhythm at 72 bpm.  T-wave abnormality infererolaterally.  February 2017 ECG (independently read by me): Normal sinus rhythm.  Left anterior hemiblock.  Inferior Q waves with ST- T-wave abnormalities inferolaterally  LABS:  BMP Latest Ref Rng & Units 01/04/2021 11/30/2019 12/16/2018  Glucose 65 - 99 mg/dL 94 74 88  BUN 8 - 27 mg/dL $Remove'13 12 12  'TKUhOlw$ Creatinine 0.76 - 1.27 mg/dL 0.99 0.82 0.92  BUN/Creat Ratio 10 - $Re'24 13 15 13  'uDJ$ Sodium 134 - 144 mmol/L 139 140 138  Potassium 3.5 - 5.2 mmol/L 4.7 4.7 4.5  Chloride 96 - 106 mmol/L 101 101 99  CO2 20 - 29 mmol/L $RemoveB'24 25 22  'keSlBEqX$ Calcium 8.6 - 10.2 mg/dL 9.5 9.1 9.4     Hepatic Function Latest Ref Rng & Units 01/04/2021 11/30/2019 12/16/2018  Total Protein 6.0 - 8.5 g/dL 7.1 6.5 6.7  Albumin 3.7 - 4.7 g/dL 4.5 4.0 4.1  AST 0 - 40 IU/L $Remov'22 19 20  'yqYwGe$ ALT 0 - 44 IU/L $Remov'15 16 18  'OigqHj$ Alk Phosphatase 44 - 121 IU/L 93 88 81  Total Bilirubin 0.0 - 1.2 mg/dL 0.7 0.7 0.6  Bilirubin, Direct <=0.2 mg/dL - - -    CBC Latest Ref Rng & Units 01/04/2021 12/16/2018 06/13/2018  WBC 3.4 - 10.8 x10E3/uL 6.9 7.3 6.0  Hemoglobin 13.0 - 17.7 g/dL 14.7 13.9 14.6  Hematocrit 37.5 - 51.0 % 43.5 40.9 43.0  Platelets 150 - 450 x10E3/uL 259 279 229   Lab Results  Component Value Date   MCV 88 01/04/2021  MCV 89 12/16/2018   MCV 89 06/13/2018    Lab Results  Component Value Date   TSH 4.060 03/10/2018    BNP No results found for: BNP  ProBNP    Component Value Date/Time   PROBNP 134.4 (H) 04/03/2013 2327     Lipid Panel     Component Value Date/Time   CHOL 155 01/04/2021 1146   TRIG 123 01/04/2021 1146   HDL 51 01/04/2021 1146   CHOLHDL 3.0 01/04/2021 1146   CHOLHDL 2.6 01/04/2017 0851   VLDL 17 01/04/2017 0851   LDLCALC 82 01/04/2021 1146      RADIOLOGY: No results found.  IMPRESSION:  1. Inferior MI [facility]): November 2016, DES stent to RCA   2. CAD in native artery   3. Atypical chest pain   4. Hyperlipidemia with target LDL less than 70   5. Chronic obstructive pulmonary disease, unspecified COPD type ([place])   6. History of COVID-19: 2020   7. History of laryngectomy     ASSESSMENT AND PLAN: Mr. Stidd is a 75 year old WM who is suffered an acute inferior ST segment elevation myocardial infarction secondary to total proximal occlusion of a very large dominant RCA in November 2016.  Subsequent ejection fraction has improved to 50-55% on echocardiography with basal inferior akinesis. He has a history of COPD with emphysematous changes noted on CT scan which may be contributory to some of his shortness of breath.  He continues to be on DAPT and is on Plavix and in the past there was concern of dyspnea contributing by Brilinta.  His last nuclear scan was low risk without evidence for ischemia.  When I saw him in October 2021 after not having seen him since 2018, he was fairly stable from a cardiac standpoint.  At his last evaluation in March 2022 he had developed very sharp instantaneous chest pain with immediate relief but seem to recur.  I recommended he have a chest x-ray which does not appear that this was done.  His most recent echo Doppler study in April 2022 was reviewed with him in detail.  This continues to show normal LV function with EF 55 to 60%.  There was mild mitral vegetation.  There was evidence for mild to moderate aortic valve sclerosis without stenosis.  Pericardium appeared normal.  When I last saw him I added Zetia 10 mg to his atorvastatin 80 mg to achieve a target LDL less than 70 since his LDL in February was 82 on atorvastatin alone.  He will be seeing his primary physician next month and will be at checking complete laboratory.  He continues to be on DAPT with aspirin/Plavix.  Blood pressure today and heart  rate are stable on carvedilol low-dose 3.125 mg twice a day.  He will continue current regimen.  I will see him in 6 months for reevaluation.    Troy Sine, MD, [facility]  05/24/2021 8:41 AM

## 2021-05-24 ENCOUNTER — Encounter: Payer: Self-pay | Admitting: Cardiovascular Disease

## 2021-05-25 NOTE — Telephone Encounter (Signed)
Patient was seen in the office on July 5

## 2021-05-26 NOTE — Telephone Encounter (Signed)
Noted. Thanks.

## 2021-06-19 ENCOUNTER — Encounter: Payer: Self-pay | Admitting: Gastroenterology

## 2021-07-04 ENCOUNTER — Ambulatory Visit: Payer: Medicare HMO | Admitting: Family Medicine

## 2021-07-05 ENCOUNTER — Encounter: Payer: Self-pay | Admitting: Family Medicine

## 2021-07-15 ENCOUNTER — Emergency Department (HOSPITAL_BASED_OUTPATIENT_CLINIC_OR_DEPARTMENT_OTHER)
Admission: EM | Admit: 2021-07-15 | Discharge: 2021-07-15 | Disposition: A | Discharge status: Home / Self Care | Payer: Medicare HMO | Attending: Emergency Medicine | Admitting: Emergency Medicine

## 2021-07-15 ENCOUNTER — Encounter (HOSPITAL_BASED_OUTPATIENT_CLINIC_OR_DEPARTMENT_OTHER): Payer: Self-pay | Admitting: Emergency Medicine

## 2021-07-15 ENCOUNTER — Emergency Department (HOSPITAL_BASED_OUTPATIENT_CLINIC_OR_DEPARTMENT_OTHER): Payer: Medicare HMO

## 2021-07-15 DIAGNOSIS — Z7982 Long term (current) use of aspirin: Secondary | ICD-10-CM | POA: Insufficient documentation

## 2021-07-15 DIAGNOSIS — X58XXXA Exposure to other specified factors, initial encounter: Secondary | ICD-10-CM | POA: Insufficient documentation

## 2021-07-15 DIAGNOSIS — M542 Cervicalgia: Secondary | ICD-10-CM | POA: Diagnosis not present

## 2021-07-15 DIAGNOSIS — J449 Chronic obstructive pulmonary disease, unspecified: Secondary | ICD-10-CM | POA: Diagnosis not present

## 2021-07-15 DIAGNOSIS — S199XXA Unspecified injury of neck, initial encounter: Secondary | ICD-10-CM | POA: Diagnosis present

## 2021-07-15 DIAGNOSIS — I10 Essential (primary) hypertension: Secondary | ICD-10-CM | POA: Diagnosis not present

## 2021-07-15 DIAGNOSIS — I2510 Atherosclerotic heart disease of native coronary artery without angina pectoris: Secondary | ICD-10-CM | POA: Diagnosis not present

## 2021-07-15 DIAGNOSIS — Z7902 Long term (current) use of antithrombotics/antiplatelets: Secondary | ICD-10-CM | POA: Diagnosis not present

## 2021-07-15 DIAGNOSIS — Z85828 Personal history of other malignant neoplasm of skin: Secondary | ICD-10-CM | POA: Insufficient documentation

## 2021-07-15 DIAGNOSIS — S161XXA Strain of muscle, fascia and tendon at neck level, initial encounter: Secondary | ICD-10-CM

## 2021-07-15 DIAGNOSIS — Z87891 Personal history of nicotine dependence: Secondary | ICD-10-CM | POA: Insufficient documentation

## 2021-07-15 MED ORDER — DIAZEPAM 2 MG PO TABS
2.0000 mg | ORAL_TABLET | Freq: Once | ORAL | Status: AC
  Administered 2021-07-15: 2 mg via ORAL
  Filled 2021-07-15: qty 1

## 2021-07-15 MED ORDER — ALBUTEROL SULFATE (2.5 MG/3ML) 0.083% IN NEBU: 2.5000 mg | INHALATION_SOLUTION | RESPIRATORY_TRACT | Status: DC | PRN

## 2021-07-15 MED ORDER — KETOROLAC TROMETHAMINE 15 MG/ML IJ SOLN
15.0000 mg | Freq: Once | INTRAMUSCULAR | Status: AC
  Administered 2021-07-15: 15 mg via INTRAMUSCULAR
  Filled 2021-07-15: qty 1

## 2021-07-15 MED ORDER — DICLOFENAC SODIUM 1 % EX GEL: 4.0000 g | Freq: Four times a day (QID) | CUTANEOUS | 0 refills | Status: DC

## 2021-07-15 MED ORDER — OXYCODONE HCL 5 MG PO TABS
5.0000 mg | ORAL_TABLET | Freq: Once | ORAL | Status: AC
  Administered 2021-07-15: 5 mg via ORAL
  Filled 2021-07-15: qty 1

## 2021-07-15 MED ORDER — MORPHINE SULFATE 15 MG PO TABS: 7.5000 mg | ORAL_TABLET | ORAL | 0 refills | Status: DC | PRN

## 2021-07-15 MED ORDER — ACETAMINOPHEN 500 MG PO TABS
1000.0000 mg | ORAL_TABLET | Freq: Once | ORAL | Status: AC
  Administered 2021-07-15: 1000 mg via ORAL
  Filled 2021-07-15: qty 2

## 2021-07-15 NOTE — ED Notes (Signed)
Patient transported to CT 

## 2021-07-15 NOTE — ED Notes (Signed)
Patient arrived to the dept with cc of neck pain. Patient has a laryngectomy for approx 18 years per family/patient. Patient denies to myself that there are no concerns around his breathing, but that it is pain. Patient laryngectomy site appears clean, patent, and no signs of redness or other concerns noted. Oxygen saturations acceptable on room air at this time. Patient with prior hx of COPD and throat cancer per chart. RT will continue to monitor patient and respiratory needs throughout visit.

## 2021-07-15 NOTE — Discharge Instructions (Addendum)
Most likely you have strained a muscle in your neck.  This should progressively get better.  If your pain worsens you have chest pain trouble breathing arm numbness or weakness then please return to the emergency department for repeat evaluation.  Otherwise follow-up with your family doctor in the office. Use the gel as prescribed. Also take tylenol '1000mg'$ (2 extra strength) four times a day.   Then take the pain medicine if you feel like you need it. Narcotics do not help with the pain, they only make you care about it less.  You can become addicted to this, people may break into your house to steal it.  It will constipate you.  If you drive under the influence of this medicine you can get a DUI.

## 2021-07-15 NOTE — ED Triage Notes (Signed)
Bilateral neck pain, pt was playing with grandchild ,neck starting hurting, gradually got worse. Pt states has had this pain in past. But would go away on its own.

## 2021-07-15 NOTE — ED Provider Notes (Signed)
[facility] EMERGENCY DEPT Provider Note   CSN: [id number] Arrival date & time: 07/15/21  [id number]     History Chief Complaint  Patient presents with   Neck Pain    Cory Sloan is a 75 y.o. male.  75 yo M with a chief complaints of neck pain after rolling a heavy log with his son yesterday.  Pain is worsened overnight.  Improves with warm compresses.  He denies overt trauma.  Feels like the pain radiates a bit into his head.  He denies head injury.  Denies headache.  Denies chest pain shortness of breath diaphoresis.  Denies upper extremity pain or weakness.  The history is provided by the patient and the spouse.  Neck Injury This is a new problem. The current episode started 6 to 12 hours ago. The problem occurs constantly. The problem has been gradually worsening. Pertinent negatives include no chest pain, no abdominal pain, no headaches and no shortness of breath. Nothing aggravates the symptoms. Nothing relieves the symptoms. He has tried nothing for the symptoms.      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   Tobacco abuse     Patient Active Problem List   Diagnosis Date Noted   Essential hypertension 01/08/2021   History of laryngeal cancer 11/23/2019   Carotid artery stenosis    Abdominal aortic atherosclerosis ([place]) 03/11/2017   Thoracic aortic aneurysm without rupture ([place]) 03/11/2017   Chest pain, musculoskeletal 03/10/2017   Coronary artery disease involving native coronary artery of native heart without angina  pectoris    Hyperlipidemia LDL goal <70 03/11/2016   Chronic obstructive pulmonary disease ([place])     Past Surgical History:  Procedure Laterality Date   APPENDECTOMY     CARDIAC CATHETERIZATION N/A 10/04/2015   Procedure: Left Heart Cath and Coronary Angiography;  Surgeon: Troy Sine, MD;  Location: [facility];  Service: Cardiovascular;  Laterality: N/A;   CARDIAC CATHETERIZATION N/A 10/04/2015   Procedure: Coronary Stent Intervention;  Surgeon: Troy Sine, MD;  Location: [facility];  Service: Cardiovascular;  Laterality: N/A;   COLONOSCOPY  12/23/2019   THROAT SURGERY  2005   [place]       Family History  Problem Relation Age of Onset   Cancer - Other Mother    Breast cancer Mother    Brain cancer Father    CAD Brother    Colon polyps Brother    Colon cancer Neg Hx    Esophageal cancer Neg Hx    Rectal cancer Neg Hx    Stomach cancer Neg Hx     Social History   Tobacco Use   Smoking status: Former    Packs/day: 1.50    Years: 40.00    Pack years: 60.00    Types: Cigarettes    Quit date: 08/20/2003    Years since quitting: 17.9   Smokeless tobacco: Never  Vaping Use   Vaping Use: Never used  Substance Use Topics   Alcohol use: No    Alcohol/week: 0.0  standard drinks   Drug use: No    Home Medications Prior to Admission medications   Medication Sig Start Date End Date Taking? Authorizing Provider  albuterol (VENTOLIN HFA) 108 (90 Base) MCG/ACT inhaler Inhale 1-2 puffs into the lungs every 4 (four) hours as needed for wheezing or shortness of breath (or coughing). 01/04/21  Yes Claretta Fraise, MD  aspirin EC 81 MG EC tablet Take 1 tablet (81 mg total) by mouth daily. 10/07/15  Yes Bhagat, Bhavinkumar, PA  atorvastatin (LIPITOR) 80 MG tablet TAKE 1 TABLET (80 MG TOTAL) BY MOUTH DAILY AT 6 PM. (Needs to be seen before next refill) 01/04/21  Yes Stacks, Cletus Gash, MD  carvedilol (COREG) 3.125 MG tablet TAKE 1 TABLET BY MOUTH 2 (TWO) TIMES DAILY WITH A  MEAL. 11/03/20  Yes Troy Sine, MD  clopidogrel (PLAVIX) 75 MG tablet Take 1 tablet (75 mg total) by mouth daily. 01/04/21  Yes Stacks, Cletus Gash, MD  diclofenac Sodium (VOLTAREN) 1 % GEL Apply 4 g topically 4 (four) times daily. 07/15/21  Yes Deno Etienne, DO  morphine (MSIR) 15 MG tablet Take 0.5 tablets (7.5 mg total) by mouth every 4 (four) hours as needed for severe pain. 07/15/21  Yes Deno Etienne, DO  ezetimibe (ZETIA) 10 MG tablet Take 1 tablet (10 mg total) by mouth daily. 02/08/21 05/09/21  Troy Sine, MD  nitroGLYCERIN (NITROSTAT) 0.4 MG SL tablet Place 1 tablet (0.4 mg total) under the tongue every 5 (five) minutes as needed for chest pain (CP or SOB). 05/13/20   Deberah Pelton, NP  Soft Lens Products (STERILE SALINE) SOLN Use saily to flush trach after use of Inhaler [facility]) 12/16/18   Claretta Fraise, MD    Allergies    Patient has no known allergies.  Review of Systems   Review of Systems  Constitutional:  Negative for chills and fever.  HENT:  Negative for congestion and facial swelling.   Eyes:  Negative for discharge and visual disturbance.  Respiratory:  Negative for shortness of breath.   Cardiovascular:  Negative for chest pain and palpitations.  Gastrointestinal:  Negative for abdominal pain, diarrhea and vomiting.  Musculoskeletal:  Positive for arthralgias and neck pain. Negative for myalgias.  Skin:  Negative for color change and rash.  Neurological:  Negative for tremors, syncope and headaches.  Psychiatric/Behavioral:  Negative for confusion and dysphoric mood.    Physical Exam Updated Vital Signs Pulse 71   Temp 98.2 F (36.8 C) (Oral)   Resp (!) 28   SpO2 98%   Physical Exam Vitals and nursing note reviewed.  Constitutional:      Appearance: He is well-developed.  HENT:     Head: Normocephalic and atraumatic.  Eyes:     Pupils: Pupils are equal, round, and reactive to light.  Neck:     Vascular: No JVD.     Comments: Tracheostomy.  Patient with pain  worse to the soft tissue lateral to the C1 and C2 region.  Reproduces his symptoms. Cardiovascular:     Rate and Rhythm: Normal rate and regular rhythm.     Heart sounds: No murmur heard.   No friction rub. No gallop.  Pulmonary:     Effort: No respiratory distress.     Breath sounds: No wheezing.  Abdominal:     General: There is no distension.     Tenderness: There is no abdominal tenderness. There is no guarding or rebound.  Musculoskeletal:        General: Normal  range of motion.     Cervical back: Normal range of motion and neck supple.  Skin:    Coloration: Skin is not pale.     Findings: No rash.  Neurological:     Mental Status: He is alert and oriented to person, place, and time.  Psychiatric:        Behavior: Behavior normal.    ED Results / Procedures / Treatments   Labs (all labs ordered are listed, but only abnormal results are displayed) Labs Reviewed - No data to display  EKG None  Radiology CT Cervical Spine Wo Contrast  Result Date: 07/15/2021 CLINICAL DATA:  Bilateral neck pain after the patient was playing with their grandchild. EXAM: CT CERVICAL SPINE WITHOUT CONTRAST TECHNIQUE: Multidetector CT imaging of the cervical spine was performed without intravenous contrast. Multiplanar CT image reconstructions were also generated. COMPARISON:  CT neck dated 01/18/2004. FINDINGS: Alignment: Normal. Skull base and vertebrae: No acute fracture. No primary bone lesion or focal pathologic process. Soft tissues and spinal canal: No prevertebral fluid or swelling. No visible canal hematoma. Disc levels: Moderate to severe multilevel degenerative disc and joint disease. Upper chest: Emphysematous changes are noted in the lung apices. Other: The patient is status post total laryngectomy. IMPRESSION: No acute osseous injury. No findings to explain the patient's symptoms. Electronically Signed   By: Zerita Boers M.D.   On: 07/15/2021 10:35    Procedures Procedures    Medications Ordered in ED Medications  albuterol (PROVENTIL) (2.5 MG/3ML) 0.083% nebulizer solution 2.5 mg (has no administration in time range)  acetaminophen (TYLENOL) tablet 1,000 mg (1,000 mg Oral Given 07/15/21 1017)  oxyCODONE (Oxy IR/ROXICODONE) immediate release tablet 5 mg (5 mg Oral Given 07/15/21 1016)  diazepam (VALIUM) tablet 2 mg (2 mg Oral Given 07/15/21 1017)  ketorolac (TORADOL) 15 MG/ML injection 15 mg (15 mg Intramuscular Given 07/15/21 1038)    ED Course  I have reviewed the triage vital signs and the nursing notes.  Pertinent labs & imaging results that were available during my care of the patient were reviewed by me and considered in my medical decision making (see chart for details).    MDM Rules/Calculators/A&P                           75 yo M with a cc of neck pain patient was rolling a heavy log yesterday with his son and experienced this pain during it and then worsening afterwards.  Most likely the patient has a muscular strain based on history.  Will obtain a CT scan to evaluate for possible other pathology.  CT scan without fracture or other acute finding.  Patient is feeling better after symptomatic treatment here.  Will discharge home.  PCP follow-up.  11:02 AM:  I have discussed the diagnosis/risks/treatment options with the patient and family and believe the pt to be eligible for discharge home to follow-up with PCP. We also discussed returning to the ED immediately if new or worsening sx occur. We discussed the sx which are most concerning (e.g., sudden worsening pain, fever, inability to tolerate by mouth, arm pain or numbness chest pain shortness of breath) that necessitate immediate return. Medications administered to the patient during their visit and any new prescriptions provided to the patient are listed below.  Medications given during this visit Medications  albuterol (PROVENTIL) (2.5 MG/3ML) 0.083% nebulizer solution 2.5 mg (has no  administration in time range)  acetaminophen (TYLENOL) tablet 1,000  mg (1,000 mg Oral Given 07/15/21 1017)  oxyCODONE (Oxy IR/ROXICODONE) immediate release tablet 5 mg (5 mg Oral Given 07/15/21 1016)  diazepam (VALIUM) tablet 2 mg (2 mg Oral Given 07/15/21 1017)  ketorolac (TORADOL) 15 MG/ML injection 15 mg (15 mg Intramuscular Given 07/15/21 1038)     The patient appears reasonably screen and/or stabilized for discharge and I doubt any other medical condition or other [facility] requiring further screening, evaluation, or treatment in the ED at this time prior to discharge.   Final Clinical Impression(s) / ED Diagnoses Final diagnoses:  Neck strain, initial encounter    Rx / DC Orders ED Discharge Orders          Ordered    diclofenac Sodium (VOLTAREN) 1 % GEL  4 times daily        07/15/21 1101    morphine (MSIR) 15 MG tablet  Every 4 hours PRN        07/15/21 [place], Kazmir Oki, DO 07/15/21 1102

## 2021-10-11 ENCOUNTER — Telehealth: Payer: Self-pay | Admitting: Family Medicine

## 2021-10-24 ENCOUNTER — Ambulatory Visit: Payer: Medicare HMO | Admitting: Family Medicine

## 2021-10-24 ENCOUNTER — Ambulatory Visit (INDEPENDENT_AMBULATORY_CARE_PROVIDER_SITE_OTHER): Payer: Medicare HMO | Admitting: Family

## 2021-10-24 ENCOUNTER — Encounter: Payer: Self-pay | Admitting: Family

## 2021-10-24 VITALS — BP 135/78 | HR 72 | Temp 98.3°F | Ht 70.0 in | Wt 161.0 lb

## 2021-10-24 DIAGNOSIS — B9689 Other specified bacterial agents as the cause of diseases classified elsewhere: Secondary | ICD-10-CM | POA: Diagnosis not present

## 2021-10-24 DIAGNOSIS — Z930 Tracheostomy status: Secondary | ICD-10-CM | POA: Diagnosis not present

## 2021-10-24 DIAGNOSIS — J208 Acute bronchitis due to other specified organisms: Secondary | ICD-10-CM | POA: Diagnosis not present

## 2021-10-24 DIAGNOSIS — Z8521 Personal history of malignant neoplasm of larynx: Secondary | ICD-10-CM

## 2021-10-24 MED ORDER — BENZONATATE 200 MG PO CAPS: 200.0000 mg | ORAL_CAPSULE | Freq: Three times a day (TID) | ORAL | 1 refills | Status: DC | PRN

## 2021-10-24 MED ORDER — DOXYCYCLINE HYCLATE 100 MG PO TABS: 100.0000 mg | ORAL_TABLET | Freq: Two times a day (BID) | ORAL | 0 refills | Status: DC

## 2021-10-24 MED ORDER — PREDNISONE 10 MG (21) PO TBPK: ORAL_TABLET | ORAL | 0 refills | Status: DC

## 2021-10-24 NOTE — Progress Notes (Addendum)
Subjective:    Patient ID: Cory Sloan, male    DOB: June 11, 1946, 75 y.o.   MRN: [id number]  Chief Complaint  Patient presents with   Cough   Pt presents to the office today with cough and congestion. He has a trach and states his mucus is so thick he is "choking" on it.  Cough This is a new problem. The current episode started 1 to 4 weeks ago. The problem has been gradually worsening. The problem occurs every few minutes. The cough is Productive of sputum and productive of purulent sputum. Associated symptoms include headaches, nasal congestion, postnasal drip, rhinorrhea, a sore throat, shortness of breath and wheezing. Pertinent negatives include no chills, ear congestion, ear pain, fever or myalgias. The symptoms are aggravated by lying down.     Review of Systems  Constitutional:  Negative for chills and fever.  HENT:  Positive for postnasal drip, rhinorrhea and sore throat. Negative for ear pain.   Respiratory:  Positive for cough, shortness of breath and wheezing.   Musculoskeletal:  Negative for myalgias.  Neurological:  Positive for headaches.  All other systems reviewed and are negative.     Objective:   Physical Exam Vitals reviewed.  Constitutional:      General: He is not in acute distress.    Appearance: He is well-developed.  HENT:     Head: Normocephalic.     Right Ear: Tympanic membrane normal.     Left Ear: Tympanic membrane normal.  Eyes:     General:        Right eye: No discharge.        Left eye: No discharge.     Pupils: Pupils are equal, round, and reactive to light.  Neck:     Thyroid: No thyromegaly.     Comments: Lurline Idol present Cardiovascular:     Rate and Rhythm: Normal rate and regular rhythm.     Heart sounds: Normal heart sounds. No murmur heard. Pulmonary:     Effort: Pulmonary effort is normal. No respiratory distress.     Breath sounds: Rhonchi present. No wheezing.  Abdominal:     General: Bowel sounds are normal. There is no  distension.     Palpations: Abdomen is soft.     Tenderness: There is no abdominal tenderness.  Musculoskeletal:        General: No tenderness. Normal range of motion.     Cervical back: Normal range of motion and neck supple.  Skin:    General: Skin is warm and dry.     Findings: No erythema or rash.  Neurological:     Mental Status: He is alert and oriented to person, place, and time.     Cranial Nerves: No cranial nerve deficit.     Deep Tendon Reflexes: Reflexes are normal and symmetric.  Psychiatric:        Behavior: Behavior normal.        Thought Content: Thought content normal.        Judgment: Judgment normal.     BP 135/78   Pulse 72   Temp 98.3 F (36.8 C) (Oral)   Ht 5\' 10"  (1.778 m)   Wt 161 lb (73 kg)   SpO2 95%   BMI 23.10 kg/m       Assessment & Plan:  Cory Sloan comes in today with chief complaint of Cough   Diagnosis and orders addressed:  1. Acute bacterial bronchitis - Take meds as prescribed - Use a  cool mist humidifier  -Use saline nose sprays frequently -Force fluids -For any cough or congestion  Use plain Mucinex- regular strength or max strength is fine -For fever or aces or pains- take tylenol or ibuprofen. -Throat lozenges if help -RTO if symptoms worsen or do not improve  - predniSONE (STERAPRED UNI-PAK 21 TAB) 10 MG (21) TBPK tablet; Use as directed  Dispense: 21 tablet; Refill: 0 - doxycycline (VIBRA-TABS) 100 MG tablet; Take 1 tablet (100 mg total) by mouth 2 (two) times daily.  Dispense: 20 tablet; Refill: 0 - benzonatate (TESSALON) 200 MG capsule; Take 1 capsule (200 mg total) by mouth 3 (three) times daily as needed.  Dispense: 30 capsule; Refill: 1   2. History of laryngeal cancer  3. Tracheostomy status ([place]) Keep airway clean Use mucinex to help Force fluids to thin mucus  Evelina Dun, FNP

## 2021-10-24 NOTE — Patient Instructions (Signed)
Acute Bronchitis, Adult °Acute bronchitis is sudden inflammation of the main airways (bronchi) that come off the windpipe (trachea) in the lungs. The swelling causes the airways to get smaller and make more mucus than normal. This can make it hard to breathe and can cause coughing or noisy breathing (wheezing). °Acute bronchitis may last several weeks. The cough may last longer. Allergies, asthma, and exposure to smoke may make the condition worse. °What are the causes? °This condition can be caused by germs and by substances that irritate the lungs, including: °Cold and flu viruses. The most common cause of this condition is the virus that causes the common cold. °Bacteria. This is less common. °Breathing in substances that irritate the lungs, including: °Smoke from cigarettes and other forms of tobacco. °Dust and pollen. °Fumes from household cleaning products, gases, or burned fuel. °Indoor or outdoor air pollution. °What increases the risk? °The following factors may make you more likely to develop this condition: °A weak body's defense system, also called the immune system. °A condition that affects your lungs and breathing, such as asthma. °What are the signs or symptoms? °Common symptoms of this condition include: °Coughing. This may bring up clear, yellow, or green mucus from your lungs (sputum). °Wheezing. °Runny or stuffy nose. °Having too much mucus in your lungs (chest congestion). °Shortness of breath. °Aches and pains, including sore throat or chest. °How is this diagnosed? °This condition is usually diagnosed based on: °Your symptoms and medical history. °A physical exam. °You may also have other tests, including tests to rule out other conditions, such as pneumonia. These tests include: °A test of lung function. °Test of a mucus sample to look for the presence of bacteria. °Tests to check the oxygen level in your blood. °Blood tests. °Chest X-ray. °How is this treated? °Most cases of acute bronchitis  clear up over time without treatment. Your health care provider may recommend: °Drinking more fluids to help thin your mucus so it is easier to cough up. °Taking inhaled medicine (inhaler) to improve air flow in and out of your lungs. °Using a vaporizer or a humidifier. These are machines that add water to the air to help you breathe better. °Taking a medicine that thins mucus and clears congestion (expectorant). °Taking a medicine that prevents or stops coughing (cough suppressant). °It is notcommon to take an antibiotic medicine for this condition. °Follow these instructions at home: ° °Take over-the-counter and prescription medicines only as told by your health care provider. °Use an inhaler, vaporizer, or humidifier as told by your health care provider. °Take two teaspoons (10 mL) of honey at bedtime to lessen coughing at night. °Drink enough fluid to keep your urine pale yellow. °Do not use any products that contain nicotine or tobacco. These products include cigarettes, chewing tobacco, and vaping devices, such as e-cigarettes. If you need help quitting, ask your health care provider. °Get plenty of rest. °Return to your normal activities as told by your health care provider. Ask your health care provider what activities are safe for you. °Keep all follow-up visits. This is important. °How is this prevented? °To lower your risk of getting this condition again: °Wash your hands often with soap and water for at least 20 seconds. If soap and water are not available, use hand sanitizer. °Avoid contact with people who have cold symptoms. °Try not to touch your mouth, nose, or eyes with your hands. °Avoid breathing in smoke or chemical fumes. Breathing smoke or chemical fumes will make your condition   worse. °Get the flu shot every year. °Contact a health care provider if: °Your symptoms do not improve after 2 weeks. °You have trouble coughing up the mucus. °Your cough keeps you awake at night. °You have a  fever. °Get help right away if you: °Cough up blood. °Feel pain in your chest. °Have severe shortness of breath. °Faint or keep feeling like you are going to faint. °Have a severe headache. °Have a fever or chills that get worse. °These symptoms may represent a serious problem that is an emergency. Do not wait to see if the symptoms will go away. Get medical help right away. Call your local emergency services (911 in the [place] Do not drive yourself to the hospital. °Summary °Acute bronchitis is inflammation of the main airways (bronchi) that come off the windpipe (trachea) in the lungs. The swelling causes the airways to get smaller and make more mucus than normal. °Drinking more fluids can help thin your mucus so it is easier to cough up. °Take over-the-counter and prescription medicines only as told by your health care provider. °Do not use any products that contain nicotine or tobacco. These products include cigarettes, chewing tobacco, and vaping devices, such as e-cigarettes. If you need help quitting, ask your health care provider. °Contact a health care provider if your symptoms do not improve after 2 weeks. °This information is not intended to replace advice given to you by your health care provider. Make sure you discuss any questions you have with your health care provider. °Document Revised: 03/08/2021 Document Reviewed: 03/08/2021 °Elsevier Patient Education © 2022 [employer] ° °

## 2021-10-25 ENCOUNTER — Ambulatory Visit: Payer: Medicare HMO | Admitting: Nurse Practitioner

## 2021-10-30 ENCOUNTER — Other Ambulatory Visit: Payer: Self-pay | Admitting: Cardiovascular Disease

## 2021-11-16 ENCOUNTER — Ambulatory Visit (INDEPENDENT_AMBULATORY_CARE_PROVIDER_SITE_OTHER): Payer: Medicare HMO | Admitting: Nurse Practitioner

## 2021-11-16 ENCOUNTER — Encounter: Payer: Self-pay | Admitting: Nurse Practitioner

## 2021-11-16 DIAGNOSIS — J208 Acute bronchitis due to other specified organisms: Secondary | ICD-10-CM | POA: Diagnosis not present

## 2021-11-16 DIAGNOSIS — B9689 Other specified bacterial agents as the cause of diseases classified elsewhere: Secondary | ICD-10-CM | POA: Insufficient documentation

## 2021-11-16 MED ORDER — BENZONATATE 200 MG PO CAPS: 200.0000 mg | ORAL_CAPSULE | Freq: Three times a day (TID) | ORAL | 1 refills | Status: DC | PRN

## 2021-11-16 NOTE — Progress Notes (Signed)
° °  Virtual Visit  Note Due to COVID-19 pandemic this visit was conducted virtually. This visit type was conducted due to national recommendations for restrictions regarding the COVID-19 Pandemic (e.g. social distancing, sheltering in place) in an effort to limit this patient's exposure and mitigate transmission in our community. All issues noted in this document were discussed and addressed.  A physical exam was not performed with this format.  I connected with Cory Sloan on 11/16/21 at 11:50 AM by telephone and verified that I am speaking with the correct person using two identifiers. Cory Sloan is currently located at home and wife is currently with patient during visit. The provider, Ivy Lynn, NP is located in their office at time of visit.  I discussed the limitations, risks, security and privacy concerns of performing an evaluation and management service by telephone and the availability of in person appointments. I also discussed with the patient that there may be a patient responsible charge related to this service. The patient expressed understanding and agreed to proceed.   History and Present Illness:  Cough This is a recurrent problem. The current episode started 1 to 4 weeks ago. The problem has been unchanged. The problem occurs constantly. The cough is Non-productive. Pertinent negatives include no chills, ear congestion, ear pain, fever, headaches, hemoptysis, rash or sore throat. Nothing aggravates the symptoms. He has tried oral steroids and prescription cough suppressant for the symptoms. The treatment provided mild relief.     Review of Systems  Constitutional:  Negative for chills and fever.  HENT:  Negative for ear pain and sore throat.   Respiratory:  Positive for cough. Negative for hemoptysis.   Skin:  Negative for rash.  Neurological:  Negative for headaches.  All other systems reviewed and are negative.   Observations/Objective: Televisit  patient not in distress  Assessment and Plan: Take meds as prescribed - Use a cool mist humidifier  -Use saline nose sprays frequently -Force fluids -Patient treated a week ago for acute bronchitis with prednisone, antibiotics and benzonatate for cough.  Patient is reporting unresolved cough. -Reported benzonatate for cough, continue all other medication as prescribed. -For fever or aches or pains- take Tylenol or ibuprofen. -If symptoms do not improve, he may need to be COVID tested to rule this out Follow up with worsening unresolved symptoms    Follow Up Instructions: Follow up with worsening unresolved symptoms    I discussed the assessment and treatment plan with the patient. The patient was provided an opportunity to ask questions and all were answered. The patient agreed with the plan and demonstrated an understanding of the instructions.   The patient was advised to call back or seek an in-person evaluation if the symptoms worsen or if the condition fails to improve as anticipated.  The above assessment and management plan was discussed with the patient. The patient verbalized understanding of and has agreed to the management plan. Patient is aware to call the clinic if symptoms persist or worsen. Patient is aware when to return to the clinic for a follow-up visit. Patient educated on when it is appropriate to go to the emergency department.   Time call ended: 12 noon  I provided 10 minutes of  non face-to-face time during this encounter.    Ivy Lynn, NP

## 2021-11-16 NOTE — Assessment & Plan Note (Signed)
Take meds as prescribed - Use a cool mist humidifier  -Use saline nose sprays frequently -Force fluids -Patient treated a week ago for acute bronchitis with prednisone, antibiotics and benzonatate for cough.  Patient is reporting unresolved cough. -Reported benzonatate for cough, continue all other medication as prescribed. -For fever or aches or pains- take Tylenol or ibuprofen. -If symptoms do not improve, he may need to be COVID tested to rule this out Follow up with worsening unresolved symptoms

## 2022-01-18 ENCOUNTER — Other Ambulatory Visit: Payer: Self-pay | Admitting: Family Medicine

## 2022-01-18 NOTE — Telephone Encounter (Signed)
Apt scheduled 02/09/2022 [facility] ?

## 2022-01-18 NOTE — Telephone Encounter (Signed)
Thirty day supply given and patient must be see for any further refills.  ?

## 2022-02-06 ENCOUNTER — Other Ambulatory Visit: Payer: Self-pay | Admitting: Cardiovascular Disease

## 2022-02-08 ENCOUNTER — Encounter: Payer: Self-pay | Admitting: Family Medicine

## 2022-02-08 ENCOUNTER — Ambulatory Visit (INDEPENDENT_AMBULATORY_CARE_PROVIDER_SITE_OTHER): Payer: Medicare HMO | Admitting: Family Medicine

## 2022-02-08 VITALS — BP 80/45 | HR 101 | Temp 98.7°F | Ht 70.0 in | Wt 162.2 lb

## 2022-02-08 DIAGNOSIS — Z930 Tracheostomy status: Secondary | ICD-10-CM | POA: Diagnosis not present

## 2022-02-08 DIAGNOSIS — I2510 Atherosclerotic heart disease of native coronary artery without angina pectoris: Secondary | ICD-10-CM | POA: Diagnosis not present

## 2022-02-08 DIAGNOSIS — I700 Atherosclerosis of aorta: Secondary | ICD-10-CM

## 2022-02-08 DIAGNOSIS — I10 Essential (primary) hypertension: Secondary | ICD-10-CM

## 2022-02-08 DIAGNOSIS — D485 Neoplasm of uncertain behavior of skin: Secondary | ICD-10-CM

## 2022-02-08 DIAGNOSIS — Z125 Encounter for screening for malignant neoplasm of prostate: Secondary | ICD-10-CM | POA: Diagnosis not present

## 2022-02-08 MED ORDER — ATORVASTATIN CALCIUM 80 MG PO TABS: ORAL_TABLET | ORAL | 3 refills | Status: DC

## 2022-02-08 MED ORDER — CLOPIDOGREL BISULFATE 75 MG PO TABS: 75.0000 mg | ORAL_TABLET | Freq: Every day | ORAL | 3 refills | Status: DC

## 2022-02-08 NOTE — Progress Notes (Signed)
? ?Subjective:  ?Patient ID: Cory Sloan, male    DOB: 1946/06/04  Age: 76 y.o. MRN: [id number] ? ?CC: Medical Management of Chronic Issues ? ? ?HPI ?[name] presents for  follow-up of hypertension. Patient has no history of headache chest pain or shortness of breath or recent cough. Patient also denies symptoms of TIA such as focal numbness or weakness. Patient denies side effects from medication. States taking it regularly. ?BP at home looking good according to pt.s wife. ? ? 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  ? ?He has a past surgical history that includes Appendectomy; Throat surgery (2005); Cardiac catheterization (N/A, 10/04/2015); Cardiac catheterization (N/A, 10/04/2015); and Colonoscopy (12/23/2019).  ? ?His family history includes Brain cancer in his father; Breast cancer in his mother; CAD in his brother; Cancer - Other in his mother; Colon polyps in his brother.He reports that he quit smoking about 18 years ago. His smoking use included cigarettes. He has a 60.00 pack-year smoking history. He has never used smokeless tobacco. He reports that he does not drink alcohol and does not use drugs. ? ?Current Outpatient Medications on File Prior to Visit  ?Medication Sig Dispense Refill  ? albuterol (VENTOLIN HFA) 108 (90 Base) MCG/ACT inhaler Inhale 1-2 puffs into the lungs every 4 (four) hours as needed for wheezing or shortness of breath (or coughing). 1 each 5  ? aspirin EC 81 MG EC tablet Take 1 tablet (81 mg total) by mouth daily.    ? benzonatate (TESSALON) 200 MG capsule Take 1 capsule (200 mg total) by mouth 3 (three) times daily as needed. 30 capsule 1  ? carvedilol (COREG) 3.125  MG tablet TAKE 1 TABLET BY MOUTH TWICE A DAY WITH MEALS 180 tablet 1  ? diclofenac Sodium (VOLTAREN) 1 % GEL Apply 4 g topically 4 (four) times daily. 100 g 0  ? nitroGLYCERIN (NITROSTAT) 0.4 MG SL tablet Place 1 tablet (0.4 mg total) under the tongue every 5 (five) minutes as needed for chest pain (CP or SOB). 25 tablet 3  ? Soft Lens Products (STERILE SALINE) SOLN Use saily to flush trach after use of Inhaler (Breo) 100 mL 10  ? ezetimibe (ZETIA) 10 MG tablet Take 1 tablet (10 mg total) by mouth daily. 90 tablet 3  ? ?No current facility-administered medications on file prior to visit.  ? ? ?ROS ?Review of Systems  ?Constitutional:  Negative for fever.  ?Respiratory:  Negative for shortness of breath.   ?Cardiovascular:  Negative for chest pain.  ?Musculoskeletal:  Negative for arthralgias.  ?Skin:  Negative for rash.  ? ?Objective:  ?BP (!) 80/45   Pulse (!) 101   Temp 98.7 ?F (37.1 ?C)   Ht _0  (1.778 m)   Wt 162 lb 3.2 oz (73.6 kg)   SpO2 94%   BMI 23.27 kg/m?  ? ?BP Readings from Last 3 Encounters:  ?02/08/22 (!) 80/45  ?10/24/21 135/78  ?05/23/21 117/70  ? ? ?Wt Readings from Last 3 Encounters:  ?02/08/22 162 lb 3.2 oz (73.6 kg)  ?10/24/21 161 lb (73 kg)  ?05/23/21 165 lb 3.2 oz (74.9 kg)  ? ? ? ?Physical Exam ?Constitutional:   ?   General: He is not in acute distress. ?   Appearance: He is well-developed.  ?  HENT:  ?   Head: Normocephalic and atraumatic.  ?   Right Ear: External ear normal.  ?   Left Ear: External ear normal.  ?   Nose: Nose normal.  ?Eyes:  ?   Conjunctiva/sclera: Conjunctivae normal.  ?   Pupils: Pupils are equal, round, and reactive to light.  ?Neck:  ?   Comments: Tracheostomy orifice at anterior neck. Speaking with amplifier ?Cardiovascular:  ?   Rate and Rhythm: Normal rate and regular rhythm.  ?   Heart sounds: Normal heart sounds. No murmur heard. ?Pulmonary:  ?   Effort: Pulmonary effort is normal. No respiratory distress.  ?   Breath sounds: Normal breath sounds. No  wheezing or rales.  ?Abdominal:  ?   Palpations: Abdomen is soft.  ?   Tenderness: There is no abdominal tenderness.  ?Musculoskeletal:     ?   General: Normal range of motion.  ?   Cervical back: Normal range of motion and neck supple.  ?Skin: ?   General: Skin is warm and dry.  ?   Findings: Lesion (4 mm behind left ear) present.  ?Neurological:  ?   Mental Status: He is alert and oriented to person, place, and time.  ?   Deep Tendon Reflexes: Reflexes are normal and symmetric.  ?Psychiatric:     ?   Behavior: Behavior normal.     ?   Thought Content: Thought content normal.     ?   Judgment: Judgment normal.  ? ? ? ? ?Assessment & Plan:  ? ?Timithy was seen today for medical management of chronic issues. ? ?Diagnoses and all orders for this visit: ? ?Neoplasm of uncertain behavior of skin ? ?Essential hypertension ?-     Ambulatory referral to Dermatology ?-     CBC with Differential/Platelet ?-     CMP14+EGFR ?-     Lipid panel ?-     PSA, total and free ? ?Abdominal aortic atherosclerosis ([place]) ?-     Lipid panel ? ?Coronary artery disease involving native coronary artery of native heart without angina pectoris ?-     Lipid panel ? ?Tracheostomy status ([place]) ? ?Screening for prostate cancer ?-     PSA, total and free ? ?Other orders ?-     atorvastatin (LIPITOR) 80 MG tablet; TAKE 1 TABLET (80 MG TOTAL) BY MOUTH DAILY AT 6 PM. ?-     clopidogrel (PLAVIX) 75 MG tablet; Take 1 tablet (75 mg total) by mouth daily. ? ? ?Allergies as of 02/08/2022   ?No Known Allergies ?  ? ?  ?Medication List  ?  ? ?  ? Accurate as of February 08, 2022 11:59 PM. If you have any questions, ask your nurse or doctor.  ?  ?  ? ?  ? ?STOP taking these medications   ? ?doxycycline 100 MG tablet ?Commonly known as: VIBRA-TABS ?Stopped by: Claretta Fraise, MD ?  ?morphine 15 MG tablet ?Commonly known as: MSIR ?Stopped by: Claretta Fraise, MD ?  ?predniSONE 10 MG (21) Tbpk tablet ?Commonly known as: STERAPRED UNI-PAK 21 TAB ?Stopped by: Claretta Fraise, MD ?  ? ?  ? ?TAKE these medications   ? ?albuterol 108 (90 Base) MCG/ACT inhaler ?Commonly known as: VENTOLIN HFA ?Inhale 1-2 puffs into the lungs every 4 (four) hours as needed for wheezing or shortness of breath (or coughing). ?  ?aspirin 81 MG EC tablet ?Take 1 tablet (81 mg total) by mouth daily. ?  ?atorvastatin 80  MG tablet ?Commonly known as: LIPITOR ?TAKE 1 TABLET (80 MG TOTAL) BY MOUTH DAILY AT 6 PM. ?What changed: See the new instructions. ?Changed by: Claretta Fraise, MD ?  ?benzonatate 200 MG capsule ?Commonly known as: TESSALON ?Take 1 capsule (200 mg total) by mouth 3 (three) times daily as needed. ?  ?carvedilol 3.125 MG tablet ?Commonly known as: COREG ?TAKE 1 TABLET BY MOUTH TWICE A DAY WITH MEALS ?  ?clopidogrel 75 MG tablet ?Commonly known as: PLAVIX ?Take 1 tablet (75 mg total) by mouth daily. ?  ?diclofenac Sodium 1 % Gel ?Commonly known as: VOLTAREN ?Apply 4 g topically 4 (four) times daily. ?  ?ezetimibe 10 MG tablet ?Commonly known as: ZETIA ?Take 1 tablet (10 mg total) by mouth daily. ?  ?nitroGLYCERIN 0.4 MG SL tablet ?Commonly known as: NITROSTAT ?Place 1 tablet (0.4 mg total) under the tongue every 5 (five) minutes as needed for chest pain (CP or SOB). ?  ?Sterile Saline Soln ?Use saily to flush trach after use of Inhaler Memory Dance) ?  ? ?  ? ? ?Meds ordered this encounter  ?Medications  ? atorvastatin (LIPITOR) 80 MG tablet  ?  Sig: TAKE 1 TABLET (80 MG TOTAL) BY MOUTH DAILY AT 6 PM.  ?  Dispense:  90 tablet  ?  Refill:  3  ? clopidogrel (PLAVIX) 75 MG tablet  ?  Sig: Take 1 tablet (75 mg total) by mouth daily.  ?  Dispense:  90 tablet  ?  Refill:  3  ? ? ? ? ?Follow-up: Return in about 3 months (around 05/11/2022). ? ?Claretta Fraise, M.D. ?

## 2022-02-09 LAB — LIPID PANEL
Chol/HDL Ratio: 3.1 ratio (ref 0.0–5.0)
Cholesterol, Total: 130 mg/dL (ref 100–199)
HDL: 42 mg/dL (ref >39)
LDL Chol Calc (NIH): 69 mg/dL (ref 0–99)
Triglycerides: 100 mg/dL (ref 0–149)
VLDL Cholesterol Cal: 19 mg/dL (ref 5–40)

## 2022-02-09 LAB — CMP14+EGFR
ALT: 17 IU/L (ref 0–44)
AST: 23 IU/L (ref 0–40)
Albumin/Globulin Ratio: 1.7 (ref 1.2–2.2)
Albumin: 4.2 g/dL (ref 3.7–4.7)
Alkaline Phosphatase: 87 IU/L (ref 44–121)
BUN/Creatinine Ratio: 17 (ref 10–24)
BUN: 21 mg/dL (ref 8–27)
Bilirubin Total: 0.6 mg/dL (ref 0.0–1.2)
CO2: 23 mmol/L (ref 20–29)
Calcium: 9.5 mg/dL (ref 8.6–10.2)
Chloride: 104 mmol/L (ref 96–106)
Creatinine, Ser: 1.26 mg/dL (ref 0.76–1.27)
Globulin, Total: 2.5 g/dL (ref 1.5–4.5)
Glucose: 93 mg/dL (ref 70–99)
Potassium: 4.6 mmol/L (ref 3.5–5.2)
Sodium: 141 mmol/L (ref 134–144)
Total Protein: 6.7 g/dL (ref 6.0–8.5)
eGFR: 60 mL/min/{1.73_m2} (ref >59)

## 2022-02-09 LAB — CBC WITH DIFFERENTIAL/PLATELET
Basophils Absolute: 0.1 10*3/uL (ref 0.0–0.2)
Basos: 1 %
EOS (ABSOLUTE): 0.1 10*3/uL (ref 0.0–0.4)
Eos: 2 %
Hematocrit: 41.6 % (ref 37.5–51.0)
Hemoglobin: 13.7 g/dL (ref 13.0–17.7)
Immature Grans (Abs): 0 10*3/uL (ref 0.0–0.1)
Immature Granulocytes: 0 %
Lymphocytes Absolute: 2 10*3/uL (ref 0.7–3.1)
Lymphs: 26 %
MCH: 29.5 pg (ref 26.6–33.0)
MCHC: 32.9 g/dL (ref 31.5–35.7)
MCV: 90 fL (ref 79–97)
Monocytes Absolute: 0.8 10*3/uL (ref 0.1–0.9)
Monocytes: 11 %
Neutrophils Absolute: 4.5 10*3/uL (ref 1.4–7.0)
Neutrophils: 60 %
Platelets: 247 10*3/uL (ref 150–450)
RBC: 4.64 x10E6/uL (ref 4.14–5.80)
RDW: 12.7 % (ref 11.6–15.4)
WBC: 7.5 10*3/uL (ref 3.4–10.8)

## 2022-02-09 LAB — PSA, TOTAL AND FREE
PSA, Free Pct: 59 %
PSA, Free: 0.59 ng/mL
Prostate Specific Ag, Serum: 1 ng/mL (ref 0.0–4.0)

## 2022-02-11 ENCOUNTER — Encounter: Payer: Self-pay | Admitting: Family Medicine

## 2022-04-02 DIAGNOSIS — I700 Atherosclerosis of aorta: Secondary | ICD-10-CM | POA: Diagnosis not present

## 2022-04-02 DIAGNOSIS — M1990 Unspecified osteoarthritis, unspecified site: Secondary | ICD-10-CM | POA: Diagnosis not present

## 2022-04-02 DIAGNOSIS — Z7902 Long term (current) use of antithrombotics/antiplatelets: Secondary | ICD-10-CM | POA: Diagnosis not present

## 2022-04-02 DIAGNOSIS — Z7982 Long term (current) use of aspirin: Secondary | ICD-10-CM | POA: Diagnosis not present

## 2022-04-02 DIAGNOSIS — I10 Essential (primary) hypertension: Secondary | ICD-10-CM | POA: Diagnosis not present

## 2022-04-02 DIAGNOSIS — Z87891 Personal history of nicotine dependence: Secondary | ICD-10-CM | POA: Diagnosis not present

## 2022-04-02 DIAGNOSIS — Z930 Tracheostomy status: Secondary | ICD-10-CM | POA: Diagnosis not present

## 2022-04-02 DIAGNOSIS — H547 Unspecified visual loss: Secondary | ICD-10-CM | POA: Diagnosis not present

## 2022-04-02 DIAGNOSIS — I739 Peripheral vascular disease, unspecified: Secondary | ICD-10-CM | POA: Diagnosis not present

## 2022-04-02 DIAGNOSIS — Z008 Encounter for other general examination: Secondary | ICD-10-CM | POA: Diagnosis not present

## 2022-04-02 DIAGNOSIS — E785 Hyperlipidemia, unspecified: Secondary | ICD-10-CM | POA: Diagnosis not present

## 2022-04-02 DIAGNOSIS — I252 Old myocardial infarction: Secondary | ICD-10-CM | POA: Diagnosis not present

## 2022-07-05 DIAGNOSIS — H5202 Hypermetropia, left eye: Secondary | ICD-10-CM | POA: Diagnosis not present

## 2022-08-01 ENCOUNTER — Other Ambulatory Visit: Payer: Self-pay | Admitting: Cardiovascular Disease

## 2022-08-07 ENCOUNTER — Ambulatory Visit (INDEPENDENT_AMBULATORY_CARE_PROVIDER_SITE_OTHER): Payer: Medicare HMO | Admitting: Family Medicine

## 2022-08-07 ENCOUNTER — Encounter: Payer: Self-pay | Admitting: Family Medicine

## 2022-08-07 VITALS — BP 87/51 | HR 73 | Temp 97.6°F | Ht 70.0 in | Wt 160.2 lb

## 2022-08-07 DIAGNOSIS — I2510 Atherosclerotic heart disease of native coronary artery without angina pectoris: Secondary | ICD-10-CM | POA: Diagnosis not present

## 2022-08-07 DIAGNOSIS — I700 Atherosclerosis of aorta: Secondary | ICD-10-CM

## 2022-08-07 DIAGNOSIS — E785 Hyperlipidemia, unspecified: Secondary | ICD-10-CM

## 2022-08-07 DIAGNOSIS — I10 Essential (primary) hypertension: Secondary | ICD-10-CM | POA: Diagnosis not present

## 2022-08-07 DIAGNOSIS — Z8521 Personal history of malignant neoplasm of larynx: Secondary | ICD-10-CM | POA: Diagnosis not present

## 2022-08-07 DIAGNOSIS — I7123 Aneurysm of the descending thoracic aorta, without rupture: Secondary | ICD-10-CM

## 2022-08-07 MED ORDER — NITROGLYCERIN 0.4 MG SL SUBL: 0.4000 mg | SUBLINGUAL_TABLET | SUBLINGUAL | 10 refills | Status: DC | PRN

## 2022-08-07 NOTE — Progress Notes (Signed)
Subjective:  Patient ID: Cory Sloan, male    DOB: Jul 15, 1946  Age: 76 y.o. MRN: [id number]  CC: Medical Management of Chronic Issues   HPI Cory Sloan presents for medical follow up of multiple concerns. BP has been running low. Gets some weakness, but denies dizziness and syncope. Pt. Still using mic for speak since laryngectomy for Ca. Stable, doing well. He has history of elevated cholesterol as well as aortic atherosclerosis for which he is taking statin med. Doing well without complaints on current medication. Denies side effects of statin including myalgia and arthralgia and nausea. Currently no chest pain, shortness of breath or other cardiovascular related symptoms noted. HE denies chest pain although he has hx of thoracic aortic aneurysm.      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   He has a past surgical history that includes Appendectomy; Throat surgery (2005); Cardiac catheterization (N/A, 10/04/2015); Cardiac catheterization (N/A, 10/04/2015); and Colonoscopy (12/23/2019).   His family history includes Brain cancer in his father; Breast cancer in his mother; CAD in his brother; Cancer - Other in his mother; Colon polyps in his brother.He reports that he quit smoking about 18 years ago. His smoking use included cigarettes. He has a 60.00 pack-year smoking history. He has never used smokeless tobacco. He reports that he does not drink alcohol and does not use drugs.    ROS Review of  Systems  Constitutional:  Negative for fever.  Respiratory:  Negative for shortness of breath.   Cardiovascular:  Negative for chest pain.  Musculoskeletal:  Negative for arthralgias.  Skin:  Negative for rash.    Objective:  BP (!) 87/51   Pulse 73   Temp 97.6 F (36.4 C)   Ht '5\' 10"'$  (1.778 m)   Wt 160 lb 3.2 oz (72.7 kg)   SpO2 92%   BMI 22.99 kg/m   BP Readings from Last 3 Encounters:  08/07/22 (!) 87/51  02/08/22 (!) 80/45  10/24/21 135/78    Wt Readings from Last 3 Encounters:  08/07/22 160 lb 3.2 oz (72.7 kg)  02/08/22 162 lb 3.2 oz (73.6 kg)  10/24/21 161 lb (73 kg)     Physical Exam Vitals reviewed.  Constitutional:      Appearance: He is well-developed.  HENT:     Head: Normocephalic and atraumatic.     Right Ear: External ear normal.     Left Ear: External ear normal.     Mouth/Throat:     Pharynx: No oropharyngeal exudate or posterior oropharyngeal erythema.  Eyes:     Pupils: Pupils are equal, round, and reactive to light.  Cardiovascular:     Rate and Rhythm: Normal rate and regular rhythm.     Heart sounds: No murmur heard. Pulmonary:     Effort: No respiratory distress.     Breath sounds: Normal breath sounds.  Musculoskeletal:     Cervical back: Normal range of motion and neck supple.  Neurological:     Mental Status: He  is alert and oriented to person, place, and time.     Comments: Mic for mechanical sounding speech       Assessment & Plan:   Mariah was seen today for medical management of chronic issues.  Diagnoses and all orders for this visit:  Essential hypertension  Abdominal aortic atherosclerosis ([place])  Coronary artery disease involving native coronary artery of native heart without angina pectoris  History of laryngeal cancer  Hyperlipidemia LDL goal <70  Aneurysm of descending thoracic aorta without rupture ([facility])  Other orders -     nitroGLYCERIN (NITROSTAT) 0.4 MG SL tablet; Place 1 tablet (0.4 mg total) under  the tongue every 5 (five) minutes as needed for chest pain (CP or SOB).       I have discontinued Gwyndolyn Saxon T. Schlabach's benzonatate. I am also having him maintain his aspirin EC, Sterile Saline, albuterol, ezetimibe, diclofenac Sodium, atorvastatin, clopidogrel, carvedilol, and nitroGLYCERIN.  Allergies as of 08/07/2022   No Known Allergies      Medication List        Accurate as of August 07, 2022  7:33 PM. If you have any questions, ask your nurse or doctor.          STOP taking these medications    benzonatate 200 MG capsule Commonly known as: TESSALON Stopped by: Claretta Fraise, MD       TAKE these medications    albuterol 108 (90 Base) MCG/ACT inhaler Commonly known as: VENTOLIN HFA Inhale 1-2 puffs into the lungs every 4 (four) hours as needed for wheezing or shortness of breath (or coughing).   aspirin EC 81 MG tablet Take 1 tablet (81 mg total) by mouth daily.   atorvastatin 80 MG tablet Commonly known as: LIPITOR TAKE 1 TABLET (80 MG TOTAL) BY MOUTH DAILY AT 6 PM.   carvedilol 3.125 MG tablet Commonly known as: COREG TAKE 1 TABLET BY MOUTH TWICE A DAY WITH MEALS   clopidogrel 75 MG tablet Commonly known as: PLAVIX Take 1 tablet (75 mg total) by mouth daily.   diclofenac Sodium 1 % Gel Commonly known as: VOLTAREN Apply 4 g topically 4 (four) times daily.   ezetimibe 10 MG tablet Commonly known as: ZETIA Take 1 tablet (10 mg total) by mouth daily.   nitroGLYCERIN 0.4 MG SL tablet Commonly known as: NITROSTAT Place 1 tablet (0.4 mg total) under the tongue every 5 (five) minutes as needed for chest pain (CP or SOB).   Sterile Saline Soln Use saily to flush trach after use of Inhaler Memory Dance)         Follow-up: Return in about 6 months (around 02/05/2023) for hypertension, cholesterol.  Claretta Fraise, M.D.

## 2022-08-26 ENCOUNTER — Other Ambulatory Visit: Payer: Self-pay | Admitting: Cardiovascular Disease

## 2022-09-21 ENCOUNTER — Other Ambulatory Visit: Payer: Self-pay | Admitting: Cardiovascular Disease

## 2022-09-26 DIAGNOSIS — H2521 Age-related cataract, morgagnian type, right eye: Secondary | ICD-10-CM | POA: Diagnosis not present

## 2022-09-26 DIAGNOSIS — H2513 Age-related nuclear cataract, bilateral: Secondary | ICD-10-CM | POA: Diagnosis not present

## 2022-10-05 ENCOUNTER — Other Ambulatory Visit: Payer: Self-pay | Admitting: Cardiovascular Disease

## 2022-10-08 DIAGNOSIS — H2521 Age-related cataract, morgagnian type, right eye: Secondary | ICD-10-CM | POA: Diagnosis not present

## 2022-10-08 DIAGNOSIS — H25812 Combined forms of age-related cataract, left eye: Secondary | ICD-10-CM | POA: Diagnosis not present

## 2022-10-17 DIAGNOSIS — H25812 Combined forms of age-related cataract, left eye: Secondary | ICD-10-CM | POA: Diagnosis not present

## 2022-10-17 DIAGNOSIS — H2521 Age-related cataract, morgagnian type, right eye: Secondary | ICD-10-CM | POA: Diagnosis not present

## 2022-10-30 ENCOUNTER — Other Ambulatory Visit: Payer: Self-pay | Admitting: Cardiovascular Disease

## 2022-11-14 ENCOUNTER — Other Ambulatory Visit: Payer: Self-pay | Admitting: Cardiovascular Disease

## 2022-11-27 ENCOUNTER — Encounter: Payer: Self-pay | Admitting: Family Medicine

## 2022-11-27 ENCOUNTER — Ambulatory Visit (INDEPENDENT_AMBULATORY_CARE_PROVIDER_SITE_OTHER): Payer: Medicare HMO

## 2022-11-27 ENCOUNTER — Ambulatory Visit (INDEPENDENT_AMBULATORY_CARE_PROVIDER_SITE_OTHER): Payer: Medicare HMO | Admitting: Family Medicine

## 2022-11-27 VITALS — BP 129/82 | HR 77 | Temp 97.5°F | Ht 70.0 in | Wt 162.4 lb

## 2022-11-27 DIAGNOSIS — M25511 Pain in right shoulder: Secondary | ICD-10-CM | POA: Diagnosis not present

## 2022-11-27 DIAGNOSIS — M19011 Primary osteoarthritis, right shoulder: Secondary | ICD-10-CM

## 2022-11-27 MED ORDER — NABUMETONE 500 MG PO TABS: 1000.0000 mg | ORAL_TABLET | Freq: Two times a day (BID) | ORAL | 1 refills | Status: DC

## 2022-11-27 NOTE — Progress Notes (Signed)
Subjective:  Patient ID: Cory Sloan, male    DOB: 04-05-1946  Age: 77 y.o. MRN: [id number]  CC: Shoulder Pain   HPI Cory Sloan presents for over 1 month of pain in the right shoulder. 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 Uses achain saw all the time with the right arm. NowPain in the ball of the shoulder and anteriorly is moderately severe. Impacting sleep. NKI. Using topical lidocaine gel. Helps if he uses it 4-5 times a day.      11/27/2022    8:10 AM 11/27/2022    8:02 AM 08/07/2022    3:16 PM  Depression screen PHQ 2/9  Decreased Interest 3 0 0  Down, Depressed, Hopeless 0 0 0  PHQ - 2 Score 3 0 0  Altered sleeping 0    Tired, decreased energy 0    Change in appetite 0    Feeling bad or failure about yourself  0    Trouble concentrating 0    Moving slowly or fidgety/restless 0    Suicidal thoughts 0    PHQ-9 Score 3    Difficult doing work/chores Not difficult at all      History Cory Sloan has a past medical history of Carotid artery stenosis, COPD (chronic obstructive pulmonary disease) ([place]), HLD (hyperlipidemia), ST elevation (STEMI) myocardial infarction involving right coronary artery ([place]) (10/04/2015), STEMI (ST elevation myocardial infarction) [facility]) (November 2016), 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   He has a past surgical history that includes Appendectomy; Throat surgery (2005); Cardiac catheterization (N/A, 10/04/2015); Cardiac catheterization (N/A, 10/04/2015); and Colonoscopy (12/23/2019).   His family history includes Brain cancer in his father; Breast cancer in his mother; CAD in his brother; Cancer - Other in his mother; Colon polyps in his brother.He reports that he quit smoking about 19 years ago. His smoking use included cigarettes. He has a 60.00 pack-year smoking history. He has never used smokeless tobacco. He reports that he does not drink alcohol and does not use drugs.    ROS Review of Systems   Constitutional:  Negative for fever.  Respiratory:  Negative for shortness of breath.   Cardiovascular:  Negative for chest pain.  Musculoskeletal:  Positive for arthralgias.  Skin:  Negative for rash.    Objective:  BP 129/82   Pulse 77   Temp (!) 97.5 F (36.4 C)   Ht '5\' 10"'$  (1.778 m)   Wt 162 lb 6.4 oz (73.7 kg)   SpO2 98%   BMI 23.30 kg/m   BP Readings from Last 3 Encounters:  11/27/22 129/82  08/07/22 (!) 87/51  02/08/22 (!) 80/45    Wt Readings from Last 3 Encounters:  11/27/22 162 lb 6.4 oz (73.7 kg)  08/07/22 160 lb 3.2 oz (72.7 kg)  02/08/22 162 lb 3.2 oz (73.6 kg)     Physical Exam Vitals reviewed.  Constitutional:      Appearance: He is well-developed.  HENT:     Head: Normocephalic and atraumatic.     Right Ear: External ear normal.     Left Ear: External ear normal.     Mouth/Throat:     Pharynx: No oropharyngeal exudate or posterior oropharyngeal erythema.  Eyes:     Pupils: Pupils are equal, round, and reactive to light.  Cardiovascular:     Rate and Rhythm: Normal rate and regular rhythm.     Heart sounds: No murmur heard. Pulmonary:     Effort: No respiratory distress.  Breath sounds: Normal breath sounds.  Musculoskeletal:        General: Tenderness (right shoulder anterior joint for internal and external rotation.) present.     Cervical back: Normal range of motion and neck supple.  Neurological:     Mental Status: He is alert and oriented to person, place, and time.       Assessment & Plan:   Cory Sloan was seen today for shoulder pain.  Diagnoses and all orders for this visit:  Arthritis of right shoulder region -     DG Shoulder Right; Future -     Ambulatory referral to Physical Therapy  Other orders -     nabumetone (RELAFEN) 500 MG tablet; Take 2 tablets (1,000 mg total) by mouth 2 (two) times daily. For muscle and joint pain       I am having Cory Sloan start on nabumetone. I am also having him maintain  his aspirin EC, Sterile Saline, albuterol, ezetimibe, diclofenac Sodium, atorvastatin, clopidogrel, nitroGLYCERIN, and carvedilol.  Allergies as of 11/27/2022   No Known Allergies      Medication List        Accurate as of November 27, 2022  9:06 AM. If you have any questions, ask your nurse or doctor.          albuterol 108 (90 Base) MCG/ACT inhaler Commonly known as: VENTOLIN HFA Inhale 1-2 puffs into the lungs every 4 (four) hours as needed for wheezing or shortness of breath (or coughing).   aspirin EC 81 MG tablet Take 1 tablet (81 mg total) by mouth daily.   atorvastatin 80 MG tablet Commonly known as: LIPITOR TAKE 1 TABLET (80 MG TOTAL) BY MOUTH DAILY AT 6 PM.   carvedilol 3.125 MG tablet Commonly known as: COREG TAKE 1 TABLET 2 (TWO) TIMES DAILY WITH A MEAL. APPOINTMENT NEEDED FOR FUTURE REFILLS.   clopidogrel 75 MG tablet Commonly known as: PLAVIX Take 1 tablet (75 mg total) by mouth daily.   diclofenac Sodium 1 % Gel Commonly known as: VOLTAREN Apply 4 g topically 4 (four) times daily.   ezetimibe 10 MG tablet Commonly known as: ZETIA Take 1 tablet (10 mg total) by mouth daily.   nabumetone 500 MG tablet Commonly known as: RELAFEN Take 2 tablets (1,000 mg total) by mouth 2 (two) times daily. For muscle and joint pain Started by: Claretta Fraise, MD   nitroGLYCERIN 0.4 MG SL tablet Commonly known as: NITROSTAT Place 1 tablet (0.4 mg total) under the tongue every 5 (five) minutes as needed for chest pain (CP or SOB).   Sterile Saline Soln Use saily to flush trach after use of Inhaler (Breo)         Follow-up: Return if symptoms worsen or fail to improve.  Claretta Fraise, M.D.

## 2022-11-30 DIAGNOSIS — H2589 Other age-related cataract: Secondary | ICD-10-CM | POA: Diagnosis not present

## 2022-11-30 DIAGNOSIS — H268 Other specified cataract: Secondary | ICD-10-CM | POA: Diagnosis not present

## 2022-12-12 NOTE — Therapy (Signed)
OUTPATIENT PHYSICAL THERAPY SHOULDER EVALUATION   Patient Name: Cory Sloan MRN: [id number] DOB:[birth year], 77 y.o., male Today's Date: 12/13/2022  END OF SESSION:  PT End of Session - 12/13/22 0945     Visit Number 1    Number of Visits 17    Date for PT Re-Evaluation 02/14/23    Authorization Type [employer] MCR    PT Start Time 0915    PT Stop Time 0955    PT Time Calculation (min) 40 min    Activity Tolerance Patient tolerated treatment well    Behavior During Therapy [facility] for tasks assessed/performed             Past Medical History:  Diagnosis Date   Carotid artery stenosis    1-39% bilateral stenosis   COPD (chronic obstructive pulmonary disease) (HCC)    HLD (hyperlipidemia)    ST elevation (STEMI) myocardial infarction involving right coronary artery ([place]) 10/04/2015   STEMI (ST elevation myocardial infarction) [facility]) November 2016   with PCI to RCA and associated cardiogenic shock   Throat cancer ([place])    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   Tobacco abuse    Past Surgical History:  Procedure Laterality Date   APPENDECTOMY     CARDIAC CATHETERIZATION N/A 10/04/2015   Procedure: Left Heart Cath and Coronary Angiography;  Surgeon: Troy Sine, MD;  Location: [place] [facility];  Service: Cardiovascular;  Laterality: N/A;   CARDIAC CATHETERIZATION N/A 10/04/2015   Procedure: Coronary Stent Intervention;  Surgeon: Troy Sine, MD;  Location: [place] [facility];  Service: Cardiovascular;  Laterality: N/A;   COLONOSCOPY  12/23/2019   [place]   [place]   Patient Active Problem List   Diagnosis Date Noted   Essential hypertension 01/08/2021   History of laryngeal cancer 11/23/2019   Carotid artery stenosis    Abdominal aortic atherosclerosis ([place]) 03/11/2017   Thoracic aortic aneurysm without rupture ([place]) 03/11/2017   Coronary artery disease involving native coronary artery of native heart without angina  pectoris    Hyperlipidemia LDL goal <70 03/11/2016   Chronic obstructive pulmonary disease ([place])     PCP: Claretta Fraise, MD   REFERRING PROVIDER: Claretta Fraise, MD  REFERRING DIAG: M25.511 (ICD-10-CM) - Acute pain of right shoulder   THERAPY DIAG:  Chronic right shoulder pain - Plan: PT plan of care cert/re-cert  Muscle weakness (generalized) - Plan: PT plan of care cert/re-cert  Rationale for Evaluation and Treatment: Rehabilitation  ONSET DATE: two months ago  SUBJECTIVE:  SUBJECTIVE STATEMENT: Pt reports primary c/o Rt shoulder pain of insidious onset beginning about two months ago. 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 He reports for the past two months, he has been able to use his Rt arm much at all. He has been off of work the past several weeks. He reports he also just had Rt eye surgery. He denies any N/T or popping/ clicking related to this problem and states that it just hurts terribly. He rates his current pain as 6/10. Worst pain is 9-10/10. Best pain is 6/10. Aggravating factors include laying on Rt shoulder, lifting Rt arm overhead. Easing factors include positional change, pain cream. Pt denies any unexplained weight change, nausea/ vomiting, unrelenting night pain.   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 Yes: NPRS scale: 6/10 Pain location: Rt shoulder Pain description: Sharp, throbbing Aggravating factors: laying on Rt shoulder, lifting Rt arm overhead Relieving factors: positional change, pain cream  PRECAUTIONS: None  WEIGHT BEARING  RESTRICTIONS: No  FALLS:  Has patient fallen in last 6 months? No  LIVING ENVIRONMENT: Lives with: lives with their family Lives in: House/apartment Stairs: No Has following equipment at home: None  OCCUPATION: Pt works cutting down trees  PLOF: [place] to work, ADLs   OBJECTIVE:   DIAGNOSTIC FINDINGS:  11/27/2022: DG Shoulder Right: IMPRESSION: 1. No acute findings. 2. Moderate degenerative changes of the glenohumeral joint and mild degenerate change of the [facility] joint. 3. Significant narrowing of the acromial humeral joint space often seen with rotator cuff injury.  PATIENT SURVEYS:  FOTO 38%, predicted 62% in 13 visits (Assisted by PT due to pt's illiteracy)  COGNITION: Overall cognitive status: Within functional limits for tasks assessed     SENSATION: Not tested  POSTURE: Forward head, rounded shoulders, Pt often holds Rt forearm with his Lt hand to offload Rt GHJ  UPPER EXTREMITY ROM:   A/PROM Right eval Left eval  Shoulder flexion 100/130p! 160/170  Shoulder abduction 40p!/80p! 160/180  Shoulder internal rotation 30p!/35p! 70/80  Shoulder external rotation 50p!/55p! 90/95  (Blank rows = not tested)  UPPER EXTREMITY MMT:  MMT Right eval Left eval  Shoulder flexion 2+/5P! 5/5  Shoulder abduction 2+/5P! 5/5  Shoulder internal rotation 4/5 5/5  Shoulder external rotation 2+/5P! 5/5  Middle trapezius    Lower trapezius    Latissimus dorsi    Elbow flexion 4/5p! 5/5  Elbow extension 5/5 5/5  Grip strength (lbs) 67 70  (Blank rows = not tested)  SHOULDER SPECIAL TESTS: Impingement tests: Neer impingement test: positive  and Hawkins/Kennedy impingement test: positive  SLAP lesions: Biceps load test: negative Rotator cuff assessment: Drop arm test: positive , Empty can test: positive , Full can test: positive , and External rotation lag sign: positive  Biceps assessment: Speed's test: negative  JOINT MOBILITY TESTING:  WNL  Rt GHJ mobilization in all planes  PALPATION:  TTP to Rt supraspinatus musculotendinous junction and LHBT   TODAY'S TREATMENT:  [facility] Adult PT Treatment:                                                DATE: 12/13/2022 Demonstrated and issued HEP    PATIENT EDUCATION: Education details: Pt educated on probable underlying pathophysiology, POC, prognosis, FOTO, and HEP Person educated: Patient Education method: Explanation, Demonstration, and Handouts Education comprehension: verbalized understanding and returned demonstration  HOME EXERCISE PROGRAM: Access Code: [email or web address] URL: [email or web address] Date: 12/13/2022 Prepared by: Vanessa Olivet  Exercises - Standing Isometric Shoulder External Rotation with Doorway *50% FORCE*  - 1 x daily - 7 x weekly - 3 sets - 10 reps - 5 seconds hold - Standing Isometric Shoulder Abduction with Doorway - Arm Bent *50% FORCE*  - 1 x daily - 7 x weekly - 3 sets - 10 reps - 5 seconds hold - Shoulder Scaption AAROM with Dowel  - 1 x daily - 7 x weekly - 2 sets - 10 reps - 5 seconds hold - Seated Scapular Retraction  - 1 x daily - 7 x weekly - 3 sets - 10 reps  ASSESSMENT:  CLINICAL IMPRESSION: Patient is a 77 y.o. M who was seen today for physical therapy evaluation and treatment for subacute Rt shoulder pain.  Upon assessment, his primary impairments include weak and painful global Rt shoulder MMT, weak and painful Rt biceps MMT, painful and limited global Rt shoulder AROM, TTP to Rt supraspinatus musculotendinous junction and LHBT, and weak Rt grip strength. Ruling up Rt shoulder rotator cuff tear due to positive test cluster (see special tests) and difficulty raising arm overhead. Cannot rule out biceps tendon involvement due to TTP to Rt LHBT and painful and weak biceps MMT, although negative special  testing decreases the likelihood of this. He will benefit from skilled PT to address his primary impairments and return to his prior level of function with less limitation. If pt fails to make progress with conservative treatment, will consider request for MRI to determine extent of shoulder pathology.   OBJECTIVE IMPAIRMENTS: decreased endurance, decreased mobility, decreased ROM, decreased strength, hypomobility, increased edema, impaired flexibility, impaired UE functional use, improper body mechanics, postural dysfunction, and pain.   ACTIVITY LIMITATIONS: carrying, lifting, sleeping, transfers, bed mobility, bathing, toileting, dressing, and reach over head  PARTICIPATION LIMITATIONS: cleaning, laundry, driving, shopping, community activity, occupation, and yard work  PERSONAL FACTORS: 3+ comorbidities: See medical hx  are also affecting patient's functional outcome.   REHAB POTENTIAL: Good  CLINICAL DECISION MAKING: Stable/uncomplicated  EVALUATION COMPLEXITY: Low   GOALS: Goals reviewed with patient? Yes  SHORT TERM GOALS: Target date: 01/10/2023  Pt will report understanding and adherence to initial HEP in order to promote independence in the management of primary impairments. Baseline: HEP provided at eval. Goal status: INITIAL    LONG TERM GOALS: Target date: 02/07/2023  Pt will achieve a FOTO score of 62% in order to demonstrate improved functional ability as it relates to the pt's primary impairments. Baseline: 48% Goal status: INITIAL  2.  Pt will achieve global Rt shoulder strength of 4+/5 or greater in order to return to work with less limitation. Baseline: See MMT chart Goal status: INITIAL  3.  Pt will achieve Rt shoulder flexion/ abduction AROM of 140 degrees in order to reach into overhead cabinets with less limitation. Baseline: See AROM chart Goal status: INITIAL  4.  Pt will report ability to lay on Rt side with 0-3/10 pain in order to sleep with less  limitation. Baseline: >6/10 pain with side lying Goal status: INITIAL   PLAN:  PT FREQUENCY: 2x/week  PT DURATION: 8 weeks  PLANNED INTERVENTIONS: Therapeutic exercises, Therapeutic activity, Neuromuscular re-education, Balance training, Gait training, Patient/Family education, Self Care, Joint mobilization, Aquatic Therapy, Dry Needling, Electrical stimulation, Spinal manipulation, Spinal mobilization, Cryotherapy, Moist heat, Taping, Vasopneumatic device, Traction, Ionotophoresis '4mg'$ /ml Dexamethasone, Manual therapy, and Re-evaluation  PLAN FOR NEXT SESSION: Progress early RTC strengthening/ mobility   Vanessa [place], PT, DPT 12/13/22 10:51 AM

## 2022-12-13 ENCOUNTER — Other Ambulatory Visit: Payer: Self-pay

## 2022-12-13 ENCOUNTER — Ambulatory Visit: Payer: Medicare HMO | Attending: Family Medicine

## 2022-12-13 DIAGNOSIS — M25511 Pain in right shoulder: Secondary | ICD-10-CM | POA: Diagnosis not present

## 2022-12-13 DIAGNOSIS — M6281 Muscle weakness (generalized): Secondary | ICD-10-CM | POA: Insufficient documentation

## 2022-12-13 DIAGNOSIS — G8929 Other chronic pain: Secondary | ICD-10-CM | POA: Diagnosis not present

## 2022-12-13 DIAGNOSIS — M19011 Primary osteoarthritis, right shoulder: Secondary | ICD-10-CM | POA: Insufficient documentation

## 2022-12-19 ENCOUNTER — Ambulatory Visit: Payer: Medicare HMO

## 2022-12-19 DIAGNOSIS — M6281 Muscle weakness (generalized): Secondary | ICD-10-CM

## 2022-12-19 DIAGNOSIS — M19011 Primary osteoarthritis, right shoulder: Secondary | ICD-10-CM | POA: Diagnosis not present

## 2022-12-19 DIAGNOSIS — G8929 Other chronic pain: Secondary | ICD-10-CM | POA: Diagnosis not present

## 2022-12-19 DIAGNOSIS — M25511 Pain in right shoulder: Secondary | ICD-10-CM | POA: Diagnosis not present

## 2022-12-19 NOTE — Therapy (Addendum)
PHYSICAL THERAPY UNPLANNED DISCHARGE SUMMARY   Visits from Start of Care: 2  Current functional level related to goals / functional outcomes: Current status unknown   Remaining deficits: Current status unknown   Education / Equipment: Pt has not returned since visit listed below  Patient goals were not assessed. Patient is being discharged due to not returning since the last visit.  (the below note was addended to include the above D/C summary on 02/01/23)   OUTPATIENT PHYSICAL THERAPY TREATMENT NOTE   Patient Name: Cory Sloan MRN: [id number] DOB:Jul 03, 1946, 77 y.o., male Today's Date: 12/19/2022  PCP: Claretta Fraise, MD  REFERRING PROVIDER: Claretta Fraise, MD   END OF SESSION:    Past Medical History:  Diagnosis Date   Carotid artery stenosis    1-39% bilateral stenosis   COPD (chronic obstructive pulmonary disease) ([place])    HLD (hyperlipidemia)    ST elevation (STEMI) myocardial infarction involving right coronary artery ([place]) 10/04/2015   STEMI (ST elevation myocardial infarction) [facility]) November 2016   with PCI to RCA and associated cardiogenic shock   Throat cancer ([place])    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   Tobacco abuse    Past Surgical History:  Procedure Laterality Date   APPENDECTOMY     CARDIAC CATHETERIZATION N/A 10/04/2015   Procedure: Left Heart Cath and Coronary Angiography;  Surgeon: Troy Sine, MD;  Location: [place] [facility];  Service: Cardiovascular;  Laterality: N/A;   CARDIAC CATHETERIZATION N/A 10/04/2015   Procedure: Coronary Stent Intervention;  Surgeon: Troy Sine, MD;  Location: [place] [facility];  Service: Cardiovascular;  Laterality: N/A;   COLONOSCOPY  12/23/2019   [place]  [place]   [place]   Patient Active Problem List   Diagnosis Date Noted   Essential hypertension 01/08/2021   History of laryngeal cancer 11/23/2019   Carotid artery stenosis    Abdominal aortic  atherosclerosis ([place]) 03/11/2017   Thoracic aortic aneurysm without rupture ([place]) 03/11/2017   Coronary artery disease involving native coronary artery of native heart without angina pectoris    Hyperlipidemia LDL goal <70 03/11/2016   Chronic obstructive pulmonary disease ([place])     REFERRING DIAG: M25.511 (ICD-10-CM) - Acute pain of right shoulder    THERAPY DIAG:  No diagnosis found.  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    PAIN:  Are you having pain? Yes: NPRS scale: 7/10 Pain location: Rt shoulder Pain description: Sharp, throbbing Aggravating factors: laying on Rt shoulder, lifting Rt arm overhead Relieving factors: positional change, pain cream   OBJECTIVE: (objective measures completed at initial evaluation unless otherwise dated)   DIAGNOSTIC FINDINGS:  11/27/2022: DG Shoulder Right: IMPRESSION: 1. No acute findings. 2. Moderate degenerative changes of the glenohumeral joint and mild degenerate change of the [facility] joint. 3. Significant narrowing of the acromial humeral joint space often seen with rotator cuff injury.   PATIENT SURVEYS:  FOTO 38%, predicted 62% in 13 visits (Assisted by PT due to pt's illiteracy)   COGNITION: Overall cognitive status: Within functional limits for tasks assessed                                  SENSATION: Not tested   POSTURE: Forward head, rounded shoulders, Pt often holds Rt forearm with his Lt hand to offload Rt GHJ    UPPER EXTREMITY ROM:    A/PROM Right eval Left eval  Shoulder flexion 100/130p! 160/170  Shoulder abduction 40p!/80p! 160/180  Shoulder internal rotation 30p!/35p! 70/80  Shoulder external rotation 50p!/55p! 90/95  (Blank rows = not tested)   UPPER EXTREMITY MMT:   MMT Right eval Left eval  Shoulder flexion 2+/5P! 5/5  Shoulder abduction 2+/5P! 5/5  Shoulder internal rotation 4/5 5/5  Shoulder external rotation 2+/5P! 5/5  Middle trapezius      Lower trapezius      Latissimus dorsi      Elbow flexion 4/5p! 5/5  Elbow extension 5/5 5/5  Grip strength (lbs) 67 70  (Blank rows = not tested)   SHOULDER SPECIAL TESTS: Impingement tests: Neer impingement test: positive  and Hawkins/Kennedy impingement test: positive  SLAP lesions: Biceps load test: negative Rotator cuff assessment: Drop arm test: positive , Empty can test: positive , Full can test: positive , and External rotation lag sign: positive  Biceps assessment: Speed's test: negative   JOINT MOBILITY TESTING:  WNL Rt GHJ mobilization in all planes   PALPATION:  TTP to Rt supraspinatus musculotendinous junction and LHBT             TODAY'S TREATMENT:   [facility] Adult PT Treatment:                                                DATE: 12/19/22 Therapeutic Exercise: Seated scapular retraction 3x10 Seated scaption AAROM with dowel 2x10 (pain) Seated ER AAROM with dowel 3x10 Rt Supine chest press with 500g ball 3x10 Rt Supine shoulder flexion with dowel small range 2x10 (pain) Supine elbow flexion/extension with dowel 3# 3x10 Supine elbow flexion/extension 1000g ball 3x10 Rt Sidelying ER tennis ball ball 3x10 Rt Sidelying abduction (too painful and limited ROM) Modalities: Vasopneumatic (Game Ready)   Location:  right shoulder Time:  15 minutes Pressure:  low Temperature:  34 degrees                                                   [facility] Adult  PT Treatment:                                                DATE:  12/13/2022 Demonstrated and issued HEP       PATIENT EDUCATION: Education details: Pt educated on probable underlying pathophysiology, POC, prognosis, FOTO, and HEP Person educated: Patient Education method: Explanation, Demonstration, and Handouts Education comprehension: verbalized understanding and returned demonstration   HOME EXERCISE PROGRAM: Access Code: [email or web address] URL: [email or web address] Date: 12/13/2022 Prepared by: Vanessa [place]   Exercises - Standing Isometric Shoulder External Rotation with Doorway *50% FORCE*  - 1 x daily - 7 x weekly - 3 sets - 10 reps - 5 seconds hold - Standing Isometric Shoulder Abduction with Doorway - Arm Bent *50% FORCE*  - 1 x daily - 7 x weekly - 3 sets - 10 reps - 5 seconds hold - Shoulder Scaption AAROM with Dowel  - 1 x daily - 7 x weekly - 2 sets - 10 reps - 5 seconds hold - Seated Scapular Retraction  - 1 x daily - 7 x weekly - 3 sets - 10 reps   ASSESSMENT:   CLINICAL IMPRESSION: Patient presents to first follow up PT session reporting continued high levels of pain in his Rt shoulder, stating it keeps him up at night. He reports HEP compliance, but states that the exercises are not helping his pain. 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 Session today focused on gentle ROM and strengthening, he is limited by pain and weakness particularly with flexion/scaption/abduction. At end of session patient reports sharp increase in pain, 10/10, administered vaso with patient reporting only mild reduction in pain to 9/10. Patient may benefit from further imaging of R shoulder.    OBJECTIVE IMPAIRMENTS: decreased endurance, decreased mobility, decreased ROM, decreased strength, hypomobility, increased edema, impaired flexibility, impaired UE functional use, improper body mechanics, postural dysfunction, and pain.    ACTIVITY  LIMITATIONS: carrying, lifting, sleeping, transfers, bed mobility, bathing, toileting, dressing, and reach over head   PARTICIPATION LIMITATIONS: cleaning, laundry, driving, shopping, community activity, occupation, and yard work   PERSONAL FACTORS: 3+ comorbidities: See medical hx  are also affecting patient's functional outcome.    REHAB POTENTIAL: Good   CLINICAL DECISION MAKING: Stable/uncomplicated   EVALUATION COMPLEXITY: Low     GOALS: Goals reviewed with patient? Yes   SHORT TERM GOALS: Target date: 01/10/2023   Pt will report understanding and adherence to initial HEP in order to promote independence in the management of primary impairments. Baseline: HEP provided at eval. Goal status: INITIAL       LONG TERM GOALS: Target date: 02/07/2023   Pt will achieve a FOTO score of 62% in order to demonstrate improved functional ability as it relates to the pt's primary impairments. Baseline: 48% Goal status: INITIAL   2.  Pt will achieve global Rt shoulder strength of 4+/5 or greater in order to return to work with less limitation. Baseline: See MMT chart Goal status: INITIAL   3.  Pt will achieve Rt shoulder flexion/ abduction AROM of 140 degrees in order to reach into overhead cabinets with less limitation. Baseline: See AROM chart Goal status: INITIAL   4.  Pt will report ability to lay on Rt side with 0-3/10  pain in order to sleep with less limitation. Baseline: >6/10 pain with side lying Goal status: INITIAL     PLAN:   PT FREQUENCY: 2x/week   PT DURATION: 8 weeks   PLANNED INTERVENTIONS: Therapeutic exercises, Therapeutic activity, Neuromuscular re-education, Balance training, Gait training, Patient/Family education, Self Care, Joint mobilization, Aquatic Therapy, Dry Needling, Electrical stimulation, Spinal manipulation, Spinal mobilization, Cryotherapy, Moist heat, Taping, Vasopneumatic device, Traction, Ionotophoresis 4mg /ml Dexamethasone, Manual therapy, and  Re-evaluation   PLAN FOR NEXT SESSION: Progress early RTC strengthening/ mobility     Bassel Gaskill, PTA 12/19/2022, 8:51 AM

## 2022-12-20 ENCOUNTER — Ambulatory Visit: Payer: Medicare HMO

## 2022-12-24 ENCOUNTER — Other Ambulatory Visit: Payer: Self-pay | Admitting: Cardiovascular Disease

## 2022-12-24 ENCOUNTER — Telehealth: Payer: Self-pay | Admitting: Family Medicine

## 2022-12-24 NOTE — Telephone Encounter (Signed)
The next step would be to see an orthopedist

## 2022-12-25 ENCOUNTER — Ambulatory Visit: Payer: Medicare HMO | Admitting: Physical Therapy

## 2022-12-25 ENCOUNTER — Other Ambulatory Visit: Payer: Self-pay | Admitting: Family Medicine

## 2022-12-25 DIAGNOSIS — M19011 Primary osteoarthritis, right shoulder: Secondary | ICD-10-CM

## 2022-12-25 NOTE — Telephone Encounter (Signed)
Patient agrees with orthopedic referral. He is hoping for MRI. He says he is hurting pretty bad.

## 2022-12-25 NOTE — Telephone Encounter (Signed)
I submitted the referral.  The orthopedist said in March have an MRI in their office and we will arrange for that after you see them.

## 2022-12-27 ENCOUNTER — Encounter: Payer: Self-pay | Admitting: Nurse Practitioner

## 2022-12-27 ENCOUNTER — Ambulatory Visit (INDEPENDENT_AMBULATORY_CARE_PROVIDER_SITE_OTHER): Payer: Medicare HMO | Admitting: Nurse Practitioner

## 2022-12-27 VITALS — BP 146/89 | HR 72 | Temp 97.9°F | Resp 20 | Ht 70.0 in | Wt 161.0 lb

## 2022-12-27 DIAGNOSIS — M25511 Pain in right shoulder: Secondary | ICD-10-CM | POA: Diagnosis not present

## 2022-12-27 MED ORDER — METHYLPREDNISOLONE ACETATE 40 MG/ML IJ SUSP
40.0000 mg | Freq: Once | INTRAMUSCULAR | Status: AC
  Administered 2022-12-27: 40 mg via INTRAMUSCULAR

## 2022-12-27 MED ORDER — BUPIVACAINE HCL 0.25 % IJ SOLN
1.0000 mL | Freq: Once | INTRAMUSCULAR | Status: AC
  Administered 2022-12-27: 1 mL via INTRA_ARTICULAR

## 2022-12-27 NOTE — Progress Notes (Signed)
   Subjective:    Patient ID: Cory Sloan, male    DOB: [birth year], 77 y.o.   MRN: [id number]   Chief Complaint: Shoulder Pain (Saw [facility] and gave meds but was afraid to take them. Left shoulder/)   Shoulder Pain    Patient c/o let shoulder pain. He saw Dr. Livia Sloan on 11/27/22.he dx him with arthritis and gave him relafen. Patient was afraid to take meds. His shoulder pain has increased. He denies injury. He rates pain 9/10. Hurts worse when moving. Holding it still will help pai some.      Review of Systems  Constitutional:  Negative for diaphoresis.  Eyes:  Negative for pain.  Respiratory:  Negative for shortness of breath.   Cardiovascular:  Negative for chest pain, palpitations and leg swelling.  Gastrointestinal:  Negative for abdominal pain.  Endocrine: Negative for polydipsia.  Skin:  Negative for rash.  Neurological:  Negative for dizziness, weakness and headaches.  Hematological:  Does not bruise/bleed easily.  All other systems reviewed and are negative.      Objective:   Physical Exam Constitutional:      Appearance: Normal appearance.  Cardiovascular:     Rate and Rhythm: Normal rate. Rhythm irregular.     Heart sounds: Normal heart sounds.  Pulmonary:     Breath sounds: Normal breath sounds.  Musculoskeletal:     Comments: Very limited rom of right shoulder with pain on abduction and intenal rotation Grips equal bil.  Skin:    General: Skin is warm.  Neurological:     General: No focal deficit present.     Mental Status: He is alert and oriented to person, place, and time.  Psychiatric:        Mood and Affect: Mood normal.        Behavior: Behavior normal.    BP (!) 146/89   Pulse 72   Temp 97.9 F (36.6 C) (Temporal)   Resp 20   Ht '5\' 10"'$  (1.778 m)   Wt 161 lb (73 kg)   SpO2 98%   BMI 23.10 kg/m   Joint Injection/Arthrocentesis  Date/Time: 12/27/2022 9:24 AM  Performed by: Chevis Pretty, FNP Authorized by: Hassell Done,  Mary-Margaret, FNP  Indications: joint swelling and pain  Local anesthesia used: no  Anesthesia: Local anesthesia used: no  Sedation: Patient sedated: no  Preparation: Patient was prepped and draped in the usual sterile fashion. Needle size: 22 G Ultrasound guidance: no Approach: posterior Methylprednisolone amount: 40 mg Bupivacaine 0.25% amount: 1 mL Patient tolerance: patient tolerated the procedure well with no immediate complications          Assessment & Plan:   Cory Sloan in today with chief complaint of Shoulder Pain (Saw [facility] and gave meds but was afraid to take them. Left shoulder/)   1. Acute pain of right shoulder Ice bid rest RTO prn - methylPREDNISolone acetate (DEPO-MEDROL) injection 40 mg - bupivacaine (MARCAINE) 0.25 % (with pres) injection 1 mL    The above assessment and management plan was discussed with the patient. The patient verbalized understanding of and has agreed to the management plan. Patient is aware to call the clinic if symptoms persist or worsen. Patient is aware when to return to the clinic for a follow-up visit. Patient educated on when it is appropriate to go to the emergency department.   Mary-Margaret Hassell Done, FNP

## 2022-12-27 NOTE — Patient Instructions (Signed)
Joint Steroid Injection A joint steroid injection is a procedure to relieve swelling and pain in a joint. Steroids are medicines that reduce inflammation. In this procedure, your health care provider uses a syringe and a needle to inject a steroid medicine into a painful and inflamed joint. A pain-relieving medicine (anesthetic) may be injected along with the steroid. In some cases, your health care provider may use an imaging technique such as ultrasound or fluoroscopy to guide the injection. Joints that are often treated with steroid injections include the knee, shoulder, hip, and spine. These injections may also be used in the elbow, ankle, and joints of the hands or feet. You may have joint steroid injections as part of your treatment for inflammation caused by: Gout. Rheumatoid arthritis. Advanced wear-and-tear arthritis (osteoarthritis). Tendinitis. Bursitis. Joint steroid injections may be repeated, but having them too often can damage a joint or the skin over the joint. You should not have joint steroid injections less than 6 weeks apart or more than four times a year. Tell a health care provider about: Any allergies you have. All medicines you are taking, including vitamins, herbs, eye drops, creams, and over-the-counter medicines. Any problems you or family members have had with anesthetic medicines. Any blood disorders you have. Any surgeries you have had. Any medical conditions you have. Whether you are pregnant or may be pregnant. What are the risks? Generally, this is a safe treatment. However, problems may occur, including: Infection. Bleeding. Allergic reactions to medicines. Damage to the joint or tissues around the joint. Thinning of skin or loss of skin color over the joint. Temporary flushing of the face or chest. Temporary increase in pain. Temporary increase in blood sugar. Failure to relieve inflammation or pain. What happens before the treatment? Medicines Ask  your health care provider about: Changing or stopping your regular medicines. This is especially important if you are taking diabetes medicines or blood thinners. Taking medicines such as aspirin and ibuprofen. These medicines can thin your blood. Do not take these medicines unless your health care provider tells you to take them. Taking over-the-counter medicines, vitamins, herbs, and supplements. General instructions You may have imaging tests of your joint. Ask your health care provider if you can drive yourself home after the procedure. What happens during the treatment?  Your health care provider will position you for the injection and locate the injection site over your joint. The skin over the joint will be cleaned with a germ-killing soap. Your health care provider may: Spray a numbing solution (topical anesthetic) over the injection site. Inject a local anesthetic under the skin above your joint. The needle will be placed through your skin into your joint. Your health care provider may use imaging to guide the needle to the right spot for the injection. If imaging is used, a special contrast dye may be injected to confirm that the needle is in the correct location. The steroid medicine will be injected into your joint. Anesthetic may be injected along with the steroid. This may be a medicine that relieves pain for a short time (short-acting anesthetic) or for a longer time (long-acting anesthetic). The needle will be removed, and an adhesive bandage (dressing) will be placed over the injection site. The procedure may vary among health care providers and hospitals. What can I expect after the treatment? You will be able to go home after the treatment. It is normal to feel slight flushing for a few days after the injection. After the treatment, it is   common to have an increase in joint pain after the anesthetic has worn off. This may happen about an hour after a short-acting anesthetic  or about 8 hours after a longer-acting anesthetic. You should begin to feel relief from joint pain and swelling after 24 to 48 hours. Contact your health care provider if you do not begin to feel relief after 2 days. Follow these instructions at home: Injection site care Leave the adhesive dressing over your injection site in place until your health care provider says you can remove it. Check your injection site every day for signs of infection. Check for: More redness, swelling, or pain. Fluid or blood. Warmth. Pus or a bad smell. Activity Return to your normal activities as told by your health care provider. Ask your health care provider what activities are safe for you. You may be asked to limit activities that put stress on the joint for a few days. Do joint exercises as told by your health care provider. Do not take baths, swim, or use a hot tub until your health care provider approves. Ask your health care provider if you may take showers. You may only be allowed to take sponge baths. Managing pain, stiffness, and swelling  If directed, put ice on the joint. To do this: Put ice in a plastic bag. Place a towel between your skin and the bag. Leave the ice on for 20 minutes, 2-3 times a day. Remove the ice if your skin turns bright red. This is very important. If you cannot feel pain, heat, or cold, you have a greater risk of damage to the area. Raise (elevate) your joint above the level of your heart when you are sitting or lying down. General instructions Take over-the-counter and prescription medicines only as told by your health care provider. Do not use any products that contain nicotine or tobacco, such as cigarettes, e-cigarettes, and chewing tobacco. These can delay joint healing. If you need help quitting, ask your health care provider. If you have diabetes, be aware that your blood sugar may be slightly elevated for several days after the injection. Keep all follow-up visits.  This is important. Contact a health care provider if you have: Chills or a fever. Any signs of infection at your injection site. Increased pain or swelling or no relief after 2 days. Summary A joint steroid injection is a treatment to relieve pain and swelling in a joint. Steroids are medicines that reduce inflammation. Your health care provider may add an anesthetic along with the steroid. You may have joint steroid injections as part of your arthritis treatment. Joint steroid injections may be repeated, but having them too often can damage a joint or the skin over the joint. Contact your health care provider if you have a fever, chills, or signs of infection, or if you get no relief from joint pain or swelling. This information is not intended to replace advice given to you by your health care provider. Make sure you discuss any questions you have with your health care provider. Document Revised: 04/15/2020 Document Reviewed: 04/15/2020 Elsevier Patient Education  2023 [employer]  

## 2022-12-28 ENCOUNTER — Ambulatory Visit: Payer: Medicare HMO | Admitting: Physical Therapy

## 2022-12-31 ENCOUNTER — Encounter: Payer: Self-pay | Admitting: Nurse Practitioner

## 2022-12-31 ENCOUNTER — Ambulatory Visit: Payer: Medicare HMO | Attending: Nurse Practitioner | Admitting: Nurse Practitioner

## 2022-12-31 VITALS — BP 128/84 | HR 69 | Ht 70.0 in | Wt 156.8 lb

## 2022-12-31 DIAGNOSIS — I2510 Atherosclerotic heart disease of native coronary artery without angina pectoris: Secondary | ICD-10-CM

## 2022-12-31 DIAGNOSIS — J449 Chronic obstructive pulmonary disease, unspecified: Secondary | ICD-10-CM

## 2022-12-31 DIAGNOSIS — E785 Hyperlipidemia, unspecified: Secondary | ICD-10-CM | POA: Diagnosis not present

## 2022-12-31 NOTE — Progress Notes (Signed)
Office Visit    Patient Name: Cory Sloan Date of Encounter: 12/31/2022  Primary Care Provider:  Claretta Fraise, MD Primary Cardiologist:  Shelva Majestic, MD  Chief Complaint    77 year old male with a history of CAD s/p STEMI, DES-RCA in 2016, hyperlipidemia, former tobacco use, COPD, and throat cancer who presents for follow-up related to CAD.  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   Tobacco abuse    Past Surgical History:  Procedure Laterality Date   APPENDECTOMY     CARDIAC CATHETERIZATION N/A 10/04/2015   Procedure: Left Heart Cath and Coronary Angiography;  Surgeon: Troy Sine, MD;  Location: [place] [facility];  Service: Cardiovascular;  Laterality: N/A;   CARDIAC CATHETERIZATION N/A 10/04/2015   Procedure: Coronary Stent Intervention;  Surgeon: Troy Sine, MD;  Location: [place] [facility];  Service: Cardiovascular;  Laterality: N/A;   COLONOSCOPY  12/23/2019   THROAT SURGERY  2005   [place]    Allergies  No Known Allergies   Labs/Other Studies Reviewed    The following studies were reviewed today: Cath 09/2015: Mid LAD to Dist LAD lesion, 15% stenosed. Prox RCA to Mid RCA lesion, 100% stenosed. Post intervention, there is a 0% residual stenosis. There is mild left ventricular systolic dysfunction.   Acute ST segment elevation myocardial infarction secondary to total proximal occlusion of a large dominant RCA.   Mild luminal irregularities of the LAD without significant obstructive  disease.   Normal left circumflex coronary artery.   Mild acute LV dysfunction with an ejection fraction of 50% with mild mid inferior wall hypocontractility.   Successful PCI/DES stenting of a large dominant RCA with ultimate insertion of a 4.032 mm Synergy DES stent postdilated to 4.6 mm 100% occlusion reduced to 0% and restoration of brisk TIMI-3 flow without evidence for dissection.   RECOMMENDATION: The patient will be maintained on dual antiplatelet therapy.  He will be vigorously hydrated due to his large RCA and potential for RV infarction.  I would anticipate good recovery of LV function.  Once his blood pressure allows he will be started on beta blocker, ACE inhibitor with  high potency statin therapy.    Myoview 03/20/2017 The left ventricular ejection fraction is mildly decreased (45-54%). Nuclear stress EF: 46%. There was no ST segment deviation noted during stress. Findings consistent with prior myocardial infarction. This is a low risk study.   Small inferior wall infarct at mid and basal level EF 46% No ischemia  Echo 02/20/2021: IMPRESSIONS    1. Left ventricular ejection fraction, by estimation, is 55 to 60%. The  left ventricle has normal function. Left ventricular diastolic parameters  are indeterminate.   2. Right ventricular systolic function is normal. The right ventricular  size is normal. Mildly increased right ventricular wall thickness.   3. The mitral valve is normal in structure. Mild mitral valve  regurgitation.   4. The aortic valve is tricuspid. Aortic valve regurgitation is not  visualized. Mild to moderate aortic  valve sclerosis/calcification is  present, without any evidence of aortic stenosis.   5. The inferior vena cava is normal in size with <50% respiratory  variability, suggesting right atrial pressure of 8 mmHg.   Comparison(s): The left ventricular function is unchanged.  Recent Labs: 02/08/2022: ALT 17; BUN 21; Creatinine, Ser 1.26;  Hemoglobin 13.7; Platelets 247; Potassium 4.6; Sodium 141  Recent Lipid Panel    Component Value Date/Time   CHOL 130 02/08/2022 1616   TRIG 100 02/08/2022 1616   HDL 42 02/08/2022 1616   CHOLHDL 3.1 02/08/2022 1616   CHOLHDL 2.6 01/04/2017 0851   VLDL 17 01/04/2017 0851   LDLCALC 69 02/08/2022 1616    History of Present Illness    77 year old male with the above past medical history including CAD s/p STEMI, DES-RCA in 2016, hyperlipidemia, former tobacco use, COPD, and throat cancer.  He was hospitalized in November 2016 in the setting of STEMI, occluded RCA.  He underwent successful DES-RCA.  EF was 50% with mild inferior hypokinesis.  His hospital course was complicated by cardiogenic shock and hypotension following cardiac catheterization.  CT of the chest during hospitalization also showed emphysematous disease.  Echocardiogram in 10/2015 showed EF 50 to 55% with akinesis of the basal inferior myocardium, G1 DD.  He did not tolerate Brilinta due to shortness of breath and was later transitioned to Plavix.  He was hospitalized in 2018 with recurrent chest pain.  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  This was reviewed by vascular surgery who did not feel that there was actual dissection.  His chest pain was thought to be musculoskeletal in nature.  Echocardiogram showed EF 55 to 60%, G1 DD.  Myoview in 04/07/2017 was low risk, EF 46%.  Most recent echocardiogram in 02/2021 showed EF 55 to 60%, normal LV function, normal RV systolic function, mild mitral valve regurgitation. He was last seen in the office on 05/23/2021 and was stable from a cardiac standpoint.   He presents today for follow-up.  Since his last visit he has done well from a cardiac standpoint.  He has injured his right shoulder and is pending MRI.  He denies symptoms concerning for angina.  He continues to work in the [employer].  Overall, he reports feeling well.  Home  Medications    Current Outpatient Medications  Medication Sig Dispense Refill   albuterol (VENTOLIN HFA) 108 (90 Base) MCG/ACT inhaler Inhale 1-2 puffs into the lungs every 4 (four) hours as needed for wheezing or shortness of breath (or coughing). 1 each 5   aspirin EC 81 MG EC tablet Take 1 tablet (81 mg total) by mouth daily.     atorvastatin (LIPITOR) 80 MG tablet TAKE 1 TABLET (80 MG TOTAL) BY MOUTH DAILY AT 6 PM. 90 tablet 3   carvedilol (COREG) 3.125 MG tablet TAKE 1 TABLET 2 (TWO) TIMES DAILY WITH A MEAL. APPOINTMENT NEEDED FOR FUTURE REFILLS. 90 tablet 0   clopidogrel (PLAVIX) 75 MG tablet Take 1 tablet (75 mg total) by mouth daily. 90 tablet 3   diclofenac Sodium (VOLTAREN) 1 % GEL Apply 4 g topically 4 (four) times daily. 100 g 0   nabumetone (RELAFEN) 500 MG tablet Take 2 tablets (1,000 mg total) by mouth 2 (two) times daily. For muscle and joint pain 360 tablet 1   nitroGLYCERIN (NITROSTAT) 0.4 MG SL tablet Place 1 tablet (0.4 mg total) under the tongue every 5 (five) minutes as needed for chest  pain (CP or SOB). 25 tablet 10   Soft Lens Products (STERILE SALINE) SOLN Use saily to flush trach after use of Inhaler (Breo) 100 mL 10   ezetimibe (ZETIA) 10 MG tablet Take 1 tablet (10 mg total) by mouth daily. 90 tablet 3   No current facility-administered medications for this visit.     Review of Systems    He denies chest pain, palpitations, dyspnea, pnd, orthopnea, n, v, dizziness, syncope, edema, weight gain, or early satiety. All other systems reviewed and are otherwise negative except as noted above.   Physical Exam    VS:  BP 128/84   Pulse 69   Ht 5' 10"$  (1.778 m)   Wt 156 lb 12.8 oz (71.1 kg)   SpO2 97%   BMI 22.50 kg/m   GEN: Well nourished, well developed, in no acute distress. HEENT: normal. Neck: Supple, no JVD, carotid bruits, or masses. Cardiac: RRR, no murmurs, rubs, or gallops. No clubbing, cyanosis, edema.  Radials/DP/PT 2+ and equal bilaterally.   Respiratory:  Respirations regular and unlabored, clear to auscultation bilaterally. GI: Soft, nontender, nondistended, BS + x 4. MS: no deformity or atrophy. Skin: warm and dry, no rash. Neuro:  Strength and sensation are intact. Psych: Normal affect.  Accessory Clinical Findings    ECG personally reviewed by me today -NSR, 69 bpm, nonspecific ST/T wave abnormality- no acute changes.   Lab Results  Component Value Date   WBC 7.5 02/08/2022   HGB 13.7 02/08/2022   HCT 41.6 02/08/2022   MCV 90 02/08/2022   PLT 247 02/08/2022   Lab Results  Component Value Date   CREATININE 1.26 02/08/2022   BUN 21 02/08/2022   NA 141 02/08/2022   K 4.6 02/08/2022   CL 104 02/08/2022   CO2 23 02/08/2022   Lab Results  Component Value Date   ALT 17 02/08/2022   AST 23 02/08/2022   ALKPHOS 87 02/08/2022   BILITOT 0.6 02/08/2022   Lab Results  Component Value Date   CHOL 130 02/08/2022   HDL 42 02/08/2022   LDLCALC 69 02/08/2022   TRIG 100 02/08/2022   CHOLHDL 3.1 02/08/2022    Lab Results  Component Value Date   HGBA1C 5.7 12/16/2018    Assessment & Plan    1. CAD:  S/p STEMI, DES-RCA in 2016. Stable with no anginal symptoms. No indication for ischemic evaluation.  Continue aspirin, Plavix, carvedilol, Lipitor, and Zetia.  2. Hyperlipidemia: LDL was 69 in 01/2022. Monitored and managed per PCP.  Continue Lipitor, Zetia.  3. COPD:  Denies any worsening dyspnea. Stable., managed per PCP.   4. Disposition: Follow-up in 1 year with Dr. [name].      Lenna Sciara, NP 12/31/2022, 9:11 AM

## 2022-12-31 NOTE — Patient Instructions (Signed)
Medication Instructions:  Your physician recommends that you continue on your current medications as directed. Please refer to the Current Medication list given to you today.   *If you need a refill on your cardiac medications before your next appointment, please call your pharmacy*   Lab Work: NONE ordered at this time of appointment   If you have labs (blood work) drawn today and your tests are completely normal, you will receive your results only by: [place] (if you have MyChart) OR A paper copy in the mail If you have any lab test that is abnormal or we need to change your treatment, we will call you to review the results.   Testing/Procedures: NONE ordered at this time of appointment     Follow-Up: At [facility], you and your health needs are our priority.  As part of our continuing mission to provide you with exceptional heart care, we have created designated Provider Care Teams.  These Care Teams include your primary Cardiologist (physician) and Advanced Practice Providers (APPs -  Physician Assistants and Nurse Practitioners) who all work together to provide you with the care you need, when you need it.  We recommend signing up for the patient portal called "MyChart".  Sign up information is provided on this After Visit Summary.  MyChart is used to connect with patients for Virtual Visits (Telemedicine).  Patients are able to view lab/test results, encounter notes, upcoming appointments, etc.  Non-urgent messages can be sent to your provider as well.   To learn more about what you can do with MyChart, go to [email or web address].    Your next appointment:   1 year(s)  Provider:   Shelva Majestic, MD     Other Instructions

## 2023-01-01 ENCOUNTER — Ambulatory Visit: Payer: Medicare HMO | Admitting: Physical Therapy

## 2023-01-03 ENCOUNTER — Ambulatory Visit: Payer: Medicare HMO | Admitting: Physical Therapy

## 2023-01-03 ENCOUNTER — Other Ambulatory Visit: Payer: Self-pay | Admitting: Family Medicine

## 2023-01-09 DIAGNOSIS — M25511 Pain in right shoulder: Secondary | ICD-10-CM | POA: Diagnosis not present

## 2023-01-17 ENCOUNTER — Encounter: Payer: Medicare HMO | Admitting: Physical Therapy

## 2023-01-21 DIAGNOSIS — M19011 Primary osteoarthritis, right shoulder: Secondary | ICD-10-CM | POA: Diagnosis not present

## 2023-01-22 ENCOUNTER — Ambulatory Visit: Payer: Medicare HMO | Admitting: Physical Therapy

## 2023-02-05 ENCOUNTER — Ambulatory Visit (INDEPENDENT_AMBULATORY_CARE_PROVIDER_SITE_OTHER): Payer: Medicare HMO | Admitting: Family Medicine

## 2023-02-05 ENCOUNTER — Encounter: Payer: Self-pay | Admitting: Family Medicine

## 2023-02-05 VITALS — BP 118/71 | HR 60 | Temp 98.1°F | Ht 70.0 in | Wt 161.2 lb

## 2023-02-05 DIAGNOSIS — I10 Essential (primary) hypertension: Secondary | ICD-10-CM

## 2023-02-05 DIAGNOSIS — Z125 Encounter for screening for malignant neoplasm of prostate: Secondary | ICD-10-CM | POA: Diagnosis not present

## 2023-02-05 DIAGNOSIS — E785 Hyperlipidemia, unspecified: Secondary | ICD-10-CM | POA: Diagnosis not present

## 2023-02-05 LAB — LIPID PANEL

## 2023-02-05 MED ORDER — CLOPIDOGREL BISULFATE 75 MG PO TABS: 75.0000 mg | ORAL_TABLET | Freq: Every day | ORAL | 2 refills | Status: DC

## 2023-02-05 MED ORDER — ATORVASTATIN CALCIUM 80 MG PO TABS: ORAL_TABLET | ORAL | 3 refills | Status: DC

## 2023-02-05 NOTE — Progress Notes (Signed)
Subjective:  Patient ID: Cory Sloan, male    DOB: Aug 01, 1946  Age: 77 y.o. MRN: [id number]  CC: Medical Management of Chronic Issues   HPI Cory Sloan presents for follow-up of elevated cholesterol. Doing well without complaints on current medication. Denies side effects of statin including myalgia and arthralgia and nausea. Also in today for liver function testing. Currently no chest pain, shortness of breath or other cardiovascular related symptoms noted.  Doing well except has up coming surgery, Right shoulder replacement. Having a lot of pain, but not taking the nabumetone due to reading package insert saying it caused heart attacks.   History Cory Sloan has a past medical history of Carotid artery stenosis, COPD (chronic obstructive pulmonary disease) ([place]), HLD (hyperlipidemia), ST elevation (STEMI) myocardial infarction involving right coronary artery ([place]) (10/04/2015), STEMI (ST elevation myocardial infarction) [facility]) (November 2016), Throat cancer [facility]), and Tobacco abuse.   Cory Sloan has a past surgical history that includes Appendectomy; Throat surgery (2005); Cardiac catheterization (N/A, 10/04/2015); Cardiac catheterization (N/A, 10/04/2015); and Colonoscopy (12/23/2019).   His family history includes Brain cancer in his father; Breast cancer in his mother; CAD in his brother; Cancer - Other in his mother; Colon polyps in his brother.Cory Sloan reports that Cory Sloan quit smoking about 19 years ago. His smoking use included cigarettes. Cory Sloan has a 60.00 pack-year smoking history. Cory Sloan has never used smokeless tobacco. Cory Sloan reports that Cory Sloan does not drink alcohol and does not use drugs.  Current Outpatient Medications on File Prior to Visit  Medication Sig Dispense Refill   albuterol (VENTOLIN HFA) 108 (90 Base) MCG/ACT inhaler Inhale 1-2 puffs into the lungs every 4 (four) hours as needed for wheezing or shortness of breath (or coughing). 1 each 5   aspirin EC 81 MG EC tablet Take 1 tablet (81 mg  total) by mouth daily.     carvedilol (COREG) 3.125 MG tablet TAKE 1 TABLET 2 (TWO) TIMES DAILY WITH A MEAL. APPOINTMENT NEEDED FOR FUTURE REFILLS. 90 tablet 0   diclofenac Sodium (VOLTAREN) 1 % GEL Apply 4 g topically 4 (four) times daily. 100 g 0   nitroGLYCERIN (NITROSTAT) 0.4 MG SL tablet Place 1 tablet (0.4 mg total) under the tongue every 5 (five) minutes as needed for chest pain (CP or SOB). 25 tablet 10   Soft Lens Products (STERILE SALINE) SOLN Use saily to flush trach after use of Inhaler (Breo) 100 mL 10   ezetimibe (ZETIA) 10 MG tablet Take 1 tablet (10 mg total) by mouth daily. 90 tablet 3   No current facility-administered medications on file prior to visit.    ROS Review of Systems  Constitutional:  Negative for fever.  Respiratory:  Negative for shortness of breath.   Cardiovascular:  Negative for chest pain.  Musculoskeletal:  Negative for arthralgias.  Skin:  Negative for rash.    Objective:  BP 118/71   Pulse 60   Temp 98.1 F (36.7 C)   Ht 5\' 10"  (1.778 m)   Wt 161 lb 3.2 oz (73.1 kg)   SpO2 97%   BMI 23.13 kg/m   BP Readings from Last 3 Encounters:  02/05/23 118/71  12/31/22 128/84  12/27/22 (!) 146/89    Wt Readings from Last 3 Encounters:  02/05/23 161 lb 3.2 oz (73.1 kg)  12/31/22 156 lb 12.8 oz (71.1 kg)  12/27/22 161 lb (73 kg)     Physical Exam Vitals reviewed.  Constitutional:      Appearance: Cory Sloan is well-developed.  HENT:  Head: Normocephalic and atraumatic.     Right Ear: External ear normal.     Left Ear: External ear normal.     Mouth/Throat:     Pharynx: No oropharyngeal exudate or posterior oropharyngeal erythema.  Eyes:     Pupils: Pupils are equal, round, and reactive to light.  Cardiovascular:     Rate and Rhythm: Normal rate and regular rhythm.     Heart sounds: No murmur heard. Pulmonary:     Effort: No respiratory distress.     Breath sounds: Normal breath sounds.  Musculoskeletal:     Cervical back: Normal range  of motion and neck supple.  Neurological:     Mental Status: Cory Sloan is alert and oriented to person, place, and time.     Lab Results  Component Value Date   HGBA1C 5.7 12/16/2018   HGBA1C 5.5 03/10/2018   HGBA1C 5.8 (H) 10/05/2015    Lab Results  Component Value Date   WBC 7.5 02/08/2022   HGB 13.7 02/08/2022   HCT 41.6 02/08/2022   PLT 247 02/08/2022   GLUCOSE 93 02/08/2022   CHOL 130 02/08/2022   TRIG 100 02/08/2022   HDL 42 02/08/2022   LDLCALC 69 02/08/2022   ALT 17 02/08/2022   AST 23 02/08/2022   NA 141 02/08/2022   K 4.6 02/08/2022   CL 104 02/08/2022   CREATININE 1.26 02/08/2022   BUN 21 02/08/2022   CO2 23 02/08/2022   TSH 4.060 03/10/2018   INR 1.02 03/10/2017   HGBA1C 5.7 12/16/2018    CT Cervical Spine Wo Contrast  Result Date: 07/15/2021 CLINICAL DATA:  Bilateral neck pain after the patient was playing with their grandchild. EXAM: CT CERVICAL SPINE WITHOUT CONTRAST TECHNIQUE: Multidetector CT imaging of the cervical spine was performed without intravenous contrast. Multiplanar CT image reconstructions were also generated. COMPARISON:  CT neck dated 01/18/2004. FINDINGS: Alignment: Normal. Skull base and vertebrae: No acute fracture. No primary bone lesion or focal pathologic process. Soft tissues and spinal canal: No prevertebral fluid or swelling. No visible canal hematoma. Disc levels: Moderate to severe multilevel degenerative disc and joint disease. Upper chest: Emphysematous changes are noted in the lung apices. Other: The patient is status post total laryngectomy. IMPRESSION: No acute osseous injury. No findings to explain the patient's symptoms. Electronically Signed   By: Cory Sloan M.D.   On: 07/15/2021 10:35    Assessment & Plan:   Cory Sloan was seen today for medical management of chronic issues.  Diagnoses and all orders for this visit:  Essential hypertension -     CBC with Differential/Platelet -     CMP14+EGFR  Hyperlipidemia LDL goal  <70 -     Lipid panel  Screening for prostate cancer -     PSA, total and free  Other orders -     atorvastatin (LIPITOR) 80 MG tablet; TAKE 1 TABLET (80 MG TOTAL) BY MOUTH DAILY AT 6 PM. -     clopidogrel (PLAVIX) 75 MG tablet; Take 1 tablet (75 mg total) by mouth daily.   I have discontinued Gwyndolyn Saxon T. Cherian's nabumetone. I have also changed his clopidogrel. Additionally, I am having him maintain his aspirin EC, Sterile Saline, albuterol, ezetimibe, diclofenac Sodium, nitroGLYCERIN, carvedilol, and atorvastatin.  Meds ordered this encounter  Medications   atorvastatin (LIPITOR) 80 MG tablet    Sig: TAKE 1 TABLET (80 MG TOTAL) BY MOUTH DAILY AT 6 PM.    Dispense:  90 tablet    Refill:  3  clopidogrel (PLAVIX) 75 MG tablet    Sig: Take 1 tablet (75 mg total) by mouth daily.    Dispense:  90 tablet    Refill:  2     Follow-up: Return in about 6 months (around 08/08/2023) for [facility] physical.  Claretta Fraise, M.D.

## 2023-02-06 LAB — LIPID PANEL
Chol/HDL Ratio: 2.6 ratio (ref 0.0–5.0)
Cholesterol, Total: 131 mg/dL (ref 100–199)
HDL: 51 mg/dL (ref >39)
LDL Chol Calc (NIH): 58 mg/dL (ref 0–99)
Triglycerides: 127 mg/dL (ref 0–149)
VLDL Cholesterol Cal: 22 mg/dL (ref 5–40)

## 2023-02-06 LAB — CBC WITH DIFFERENTIAL/PLATELET
Basophils Absolute: 0.1 10*3/uL (ref 0.0–0.2)
Basos: 1 %
EOS (ABSOLUTE): 0.1 10*3/uL (ref 0.0–0.4)
Eos: 2 %
Hematocrit: 41.8 % (ref 37.5–51.0)
Hemoglobin: 14.1 g/dL (ref 13.0–17.7)
Immature Grans (Abs): 0 10*3/uL (ref 0.0–0.1)
Immature Granulocytes: 0 %
Lymphocytes Absolute: 1.8 10*3/uL (ref 0.7–3.1)
Lymphs: 25 %
MCH: 30.8 pg (ref 26.6–33.0)
MCHC: 33.7 g/dL (ref 31.5–35.7)
MCV: 91 fL (ref 79–97)
Monocytes Absolute: 0.6 10*3/uL (ref 0.1–0.9)
Monocytes: 9 %
Neutrophils Absolute: 4.6 10*3/uL (ref 1.4–7.0)
Neutrophils: 63 %
Platelets: 215 10*3/uL (ref 150–450)
RBC: 4.58 x10E6/uL (ref 4.14–5.80)
RDW: 12.7 % (ref 11.6–15.4)
WBC: 7.3 10*3/uL (ref 3.4–10.8)

## 2023-02-06 LAB — CMP14+EGFR
ALT: 15 IU/L (ref 0–44)
AST: 20 IU/L (ref 0–40)
Albumin/Globulin Ratio: 2 (ref 1.2–2.2)
Albumin: 4.3 g/dL (ref 3.8–4.8)
Alkaline Phosphatase: 81 IU/L (ref 44–121)
BUN/Creatinine Ratio: 10 (ref 10–24)
BUN: 9 mg/dL (ref 8–27)
Bilirubin Total: 0.9 mg/dL (ref 0.0–1.2)
CO2: 24 mmol/L (ref 20–29)
Calcium: 9.4 mg/dL (ref 8.6–10.2)
Chloride: 103 mmol/L (ref 96–106)
Creatinine, Ser: 0.93 mg/dL (ref 0.76–1.27)
Globulin, Total: 2.1 g/dL (ref 1.5–4.5)
Glucose: 90 mg/dL (ref 70–99)
Potassium: 4.7 mmol/L (ref 3.5–5.2)
Sodium: 141 mmol/L (ref 134–144)
Total Protein: 6.4 g/dL (ref 6.0–8.5)
eGFR: 86 mL/min/{1.73_m2} (ref >59)

## 2023-02-06 LAB — PSA, TOTAL AND FREE
PSA, Free Pct: 50 %
PSA, Free: 0.5 ng/mL
Prostate Specific Ag, Serum: 1 ng/mL (ref 0.0–4.0)

## 2023-02-06 NOTE — Progress Notes (Signed)
Hello Amdrew,  Your lab result is normal and/or stable.Some minor variations that are not significant are commonly marked abnormal, but do not represent any medical problem for you.  Best regards, Verdia Bolt, M.D.

## 2023-03-07 DIAGNOSIS — Z7982 Long term (current) use of aspirin: Secondary | ICD-10-CM | POA: Diagnosis not present

## 2023-03-07 DIAGNOSIS — S62651B Nondisplaced fracture of medial phalanx of left index finger, initial encounter for open fracture: Secondary | ICD-10-CM | POA: Diagnosis not present

## 2023-03-07 DIAGNOSIS — Z87891 Personal history of nicotine dependence: Secondary | ICD-10-CM | POA: Diagnosis not present

## 2023-03-07 DIAGNOSIS — S67191A Crushing injury of left index finger, initial encounter: Secondary | ICD-10-CM | POA: Diagnosis not present

## 2023-03-07 DIAGNOSIS — W231XXA Caught, crushed, jammed, or pinched between stationary objects, initial encounter: Secondary | ICD-10-CM | POA: Diagnosis not present

## 2023-03-07 DIAGNOSIS — S61211A Laceration without foreign body of left index finger without damage to nail, initial encounter: Secondary | ICD-10-CM | POA: Diagnosis not present

## 2023-03-07 DIAGNOSIS — S62657A Nondisplaced fracture of medial phalanx of left little finger, initial encounter for closed fracture: Secondary | ICD-10-CM | POA: Diagnosis not present

## 2023-03-07 DIAGNOSIS — S62651A Nondisplaced fracture of medial phalanx of left index finger, initial encounter for closed fracture: Secondary | ICD-10-CM | POA: Diagnosis not present

## 2023-03-08 DIAGNOSIS — S62657A Nondisplaced fracture of medial phalanx of left little finger, initial encounter for closed fracture: Secondary | ICD-10-CM | POA: Diagnosis not present

## 2023-03-14 DIAGNOSIS — M79642 Pain in left hand: Secondary | ICD-10-CM | POA: Diagnosis not present

## 2023-03-14 DIAGNOSIS — S62651B Nondisplaced fracture of medial phalanx of left index finger, initial encounter for open fracture: Secondary | ICD-10-CM | POA: Diagnosis not present

## 2023-03-25 DIAGNOSIS — I719 Aortic aneurysm of unspecified site, without rupture: Secondary | ICD-10-CM | POA: Diagnosis not present

## 2023-03-25 DIAGNOSIS — I70209 Unspecified atherosclerosis of native arteries of extremities, unspecified extremity: Secondary | ICD-10-CM | POA: Diagnosis not present

## 2023-03-25 DIAGNOSIS — Z8521 Personal history of malignant neoplasm of larynx: Secondary | ICD-10-CM | POA: Diagnosis not present

## 2023-03-25 DIAGNOSIS — N182 Chronic kidney disease, stage 2 (mild): Secondary | ICD-10-CM | POA: Diagnosis not present

## 2023-03-25 DIAGNOSIS — J439 Emphysema, unspecified: Secondary | ICD-10-CM | POA: Diagnosis not present

## 2023-03-25 DIAGNOSIS — I129 Hypertensive chronic kidney disease with stage 1 through stage 4 chronic kidney disease, or unspecified chronic kidney disease: Secondary | ICD-10-CM | POA: Diagnosis not present

## 2023-03-25 DIAGNOSIS — M1990 Unspecified osteoarthritis, unspecified site: Secondary | ICD-10-CM | POA: Diagnosis not present

## 2023-03-25 DIAGNOSIS — I252 Old myocardial infarction: Secondary | ICD-10-CM | POA: Diagnosis not present

## 2023-03-25 DIAGNOSIS — Z7902 Long term (current) use of antithrombotics/antiplatelets: Secondary | ICD-10-CM | POA: Diagnosis not present

## 2023-03-25 DIAGNOSIS — I700 Atherosclerosis of aorta: Secondary | ICD-10-CM | POA: Diagnosis not present

## 2023-03-25 DIAGNOSIS — M5430 Sciatica, unspecified side: Secondary | ICD-10-CM | POA: Diagnosis not present

## 2023-03-25 DIAGNOSIS — E785 Hyperlipidemia, unspecified: Secondary | ICD-10-CM | POA: Diagnosis not present

## 2023-08-08 ENCOUNTER — Ambulatory Visit: Payer: Medicare HMO | Admitting: Family Medicine

## 2023-08-08 ENCOUNTER — Encounter: Payer: Self-pay | Admitting: Family Medicine

## 2023-08-08 VITALS — BP 120/75 | HR 65 | Temp 98.0°F | Ht 70.0 in | Wt 158.6 lb

## 2023-08-08 DIAGNOSIS — Z8521 Personal history of malignant neoplasm of larynx: Secondary | ICD-10-CM | POA: Diagnosis not present

## 2023-08-08 DIAGNOSIS — I712 Thoracic aortic aneurysm, without rupture, unspecified: Secondary | ICD-10-CM

## 2023-08-08 DIAGNOSIS — I10 Essential (primary) hypertension: Secondary | ICD-10-CM

## 2023-08-08 DIAGNOSIS — Z0001 Encounter for general adult medical examination with abnormal findings: Secondary | ICD-10-CM

## 2023-08-08 DIAGNOSIS — E785 Hyperlipidemia, unspecified: Secondary | ICD-10-CM

## 2023-08-08 DIAGNOSIS — Z0000 Encounter for general adult medical examination without abnormal findings: Secondary | ICD-10-CM

## 2023-08-08 DIAGNOSIS — E559 Vitamin D deficiency, unspecified: Secondary | ICD-10-CM

## 2023-08-08 DIAGNOSIS — I2510 Atherosclerotic heart disease of native coronary artery without angina pectoris: Secondary | ICD-10-CM | POA: Diagnosis not present

## 2023-08-08 LAB — URINALYSIS
Bilirubin, UA: NEGATIVE
Glucose, UA: NEGATIVE
Ketones, UA: NEGATIVE
Leukocytes,UA: NEGATIVE
Nitrite, UA: NEGATIVE
Protein,UA: NEGATIVE
RBC, UA: NEGATIVE
Specific Gravity, UA: 1.015 (ref 1.005–1.030)
Urobilinogen, Ur: 0.2 mg/dL (ref 0.2–1.0)
pH, UA: 7.5 (ref 5.0–7.5)

## 2023-08-08 MED ORDER — CLOPIDOGREL BISULFATE 75 MG PO TABS: 75.0000 mg | ORAL_TABLET | Freq: Every day | ORAL | 2 refills | Status: DC

## 2023-08-08 MED ORDER — EZETIMIBE 10 MG PO TABS: 10.0000 mg | ORAL_TABLET | Freq: Every day | ORAL | 3 refills | Status: DC

## 2023-08-08 MED ORDER — CARVEDILOL 3.125 MG PO TABS: 3.1250 mg | ORAL_TABLET | Freq: Two times a day (BID) | ORAL | 3 refills | Status: DC

## 2023-08-08 NOTE — Progress Notes (Addendum)
Subjective:  Patient ID: Cory Sloan, male    DOB: 16-Jun-1946  Age: 77 y.o. MRN: [id number]  CC: Annual Exam   HPI Cory Sloan presents for CPE.  Hx aortic aneurysm. Due CT to stage. Taking Plavix and ASA per cardiology. Has hx ASCVD   in for follow-up of elevated cholesterol. Doing well without complaints on current medication. Denies side effects of statin including myalgia and arthralgia and nausea. Currently no chest pain, shortness of breath or other cardiovascular related symptoms noted.   presents for  follow-up of hypertension. Patient has no history of headache chest pain or shortness of breath or recent cough. Patient also denies symptoms of TIA such as focal numbness or weakness. Patient denies side effects from medication. States taking it regularly.  Esophageal CA with removal of larynx. Speaks with mechanical amplifier  Some positional vertigo noted intermittently.    ,       08/08/2023    9:40 AM 02/05/2023    9:38 AM 12/27/2022    9:02 AM  Depression screen PHQ 2/9  Decreased Interest 0 0 0  Down, Depressed, Hopeless 0 0 0  PHQ - 2 Score 0 0 0  Altered sleeping   0  Tired, decreased energy   0  Change in appetite   0  Feeling bad or failure about yourself    0  Trouble concentrating   0  Moving slowly or fidgety/restless   0  Suicidal thoughts   0  PHQ-9 Score   0  Difficult doing work/chores   Not difficult at all    History Cory Sloan has a past medical history of Carotid artery stenosis, COPD (chronic obstructive pulmonary disease) ([facility]), HLD (hyperlipidemia), ST elevation (STEMI) myocardial infarction involving right coronary artery ([facility]) (10/04/2015), STEMI (ST elevation myocardial infarction) [facility]) (November 2016), 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   Cory Sloan has a past surgical history that includes Appendectomy; Throat surgery (2005); Cardiac catheterization (N/A, 10/04/2015); Cardiac catheterization (N/A, 10/04/2015); and Colonoscopy  (12/23/2019).   His family history includes Brain cancer in his father; Breast cancer in his mother; CAD in his brother; Cancer - Other in his mother; Colon polyps in his brother.Cory Sloan reports that Cory Sloan quit smoking about 19 years ago. His smoking use included cigarettes. Cory Sloan started smoking about 60 years ago. Cory Sloan has a 60 pack-year smoking history. Cory Sloan has never used smokeless tobacco. Cory Sloan reports that Cory Sloan does not drink alcohol and does not use drugs.    ROS Review of Systems  Constitutional:  Negative for activity change, fatigue and unexpected weight change.  HENT:  Negative for congestion, ear pain, hearing loss, postnasal drip and trouble swallowing.   Eyes:  Negative for pain and visual disturbance.  Respiratory:  Negative for cough, chest tightness and shortness of breath.   Cardiovascular:  Negative for chest pain, palpitations and leg swelling.  Gastrointestinal:  Negative for abdominal distention, abdominal pain, blood in stool, constipation, diarrhea, nausea and vomiting.  Endocrine: Negative for cold intolerance, heat intolerance and polydipsia.  Genitourinary:  Negative for difficulty urinating, dysuria, flank pain, frequency and urgency.  Musculoskeletal:  Negative for arthralgias and joint swelling.  Skin:  Negative for color change, rash and wound.  Neurological:  Negative for dizziness, syncope, speech difficulty, weakness, light-headedness, numbness and headaches.  Hematological:  Does not bruise/bleed easily.  Psychiatric/Behavioral:  Negative for confusion, decreased concentration, dysphoric mood and sleep disturbance. The patient is not nervous/anxious.     Objective:  BP 120/75   Pulse  65   Temp 98 F (36.7 C)   Ht 5\' 10"  (1.778 m)   Wt 158 lb 9.6 oz (71.9 kg)   SpO2 97%   BMI 22.76 kg/m   BP Readings from Last 3 Encounters:  08/10/23 136/82  08/08/23 120/75  02/05/23 118/71    Wt Readings from Last 3 Encounters:  08/10/23 158 lb 8.2 oz (71.9 kg)  08/08/23 158  lb 9.6 oz (71.9 kg)  02/05/23 161 lb 3.2 oz (73.1 kg)     Physical Exam Constitutional:      Appearance: Cory Sloan is well-developed.  HENT:     Head: Normocephalic and atraumatic.  Eyes:     Pupils: Pupils are equal, round, and reactive to light.  Neck:     Thyroid: No thyromegaly.     Trachea: No tracheal deviation.  Cardiovascular:     Rate and Rhythm: Normal rate and regular rhythm.     Heart sounds: Normal heart sounds. No murmur heard.    No friction rub. No gallop.  Pulmonary:     Breath sounds: Normal breath sounds. No wheezing or rales.  Abdominal:     General: Bowel sounds are normal. There is no distension.     Palpations: Abdomen is soft. There is no mass.     Tenderness: There is no abdominal tenderness.     Hernia: There is no hernia in the left inguinal area.  Genitourinary:    Penis: Normal.      Testes: Normal.  Musculoskeletal:        General: Normal range of motion.     Cervical back: Normal range of motion.  Lymphadenopathy:     Cervical: No cervical adenopathy.  Skin:    General: Skin is warm and dry.  Neurological:     Mental Status: Cory Sloan is alert and oriented to person, place, and time.       Assessment & Plan:   Cory Sloan was seen today for annual exam.  Diagnoses and all orders for this visit:  Well adult exam -     Urinalysis  Essential hypertension -     CBC with Differential/Platelet -     CMP14+EGFR  Hyperlipidemia LDL goal <70 -     Lipid panel  Vitamin D deficiency -     VITAMIN D 25 Hydroxy (Vit-D Deficiency, Fractures)  Thoracic aortic aneurysm without rupture, unspecified part ([facility]) -     CT Chest W Contrast; Future  Coronary artery disease involving native coronary artery of native heart without angina pectoris  History of laryngeal cancer  Other orders -     ezetimibe (ZETIA) 10 MG tablet; Take 1 tablet (10 mg total) by mouth daily. -     carvedilol (COREG) 3.125 MG tablet; Take 1 tablet (3.125 mg total) by mouth 2 (two)  times daily with a meal. -     clopidogrel (PLAVIX) 75 MG tablet; Take 1 tablet (75 mg total) by mouth daily. -     meclizine (ANTIVERT) 25 MG tablet; Take 2 tablets (50 mg total) by mouth 3 (three) times daily as needed for dizziness.       I have discontinued Cory Sloan albuterol and diclofenac Sodium. I have also changed his carvedilol. Additionally, I am having him start on meclizine. Lastly, I am having him maintain his aspirin EC, Sterile Saline, nitroGLYCERIN, atorvastatin, ezetimibe, and clopidogrel.  Allergies as of 08/08/2023   No Known Allergies      Medication List  Accurate as of August 08, 2023 11:59 PM. If you have any questions, ask your nurse or doctor.          STOP taking these medications    albuterol 108 (90 Base) MCG/ACT inhaler Commonly known as: VENTOLIN HFA Stopped by: Trelyn Vanderlinde   diclofenac Sodium 1 % Gel Commonly known as: VOLTAREN Stopped by: Arionne Iams       TAKE these medications    aspirin EC 81 MG tablet Take 1 tablet (81 mg total) by mouth daily.   atorvastatin 80 MG tablet Commonly known as: LIPITOR TAKE 1 TABLET (80 MG TOTAL) BY MOUTH DAILY AT 6 PM.   carvedilol 3.125 MG tablet Commonly known as: COREG Take 1 tablet (3.125 mg total) by mouth 2 (two) times daily with a meal. What changed: See the new instructions. Changed by: Broadus John Satoya Feeley   clopidogrel 75 MG tablet Commonly known as: PLAVIX Take 1 tablet (75 mg total) by mouth daily.   ezetimibe 10 MG tablet Commonly known as: ZETIA Take 1 tablet (10 mg total) by mouth daily.   meclizine 25 MG tablet Commonly known as: ANTIVERT Take 2 tablets (50 mg total) by mouth 3 (three) times daily as needed for dizziness. Started by: Zymere Patlan   nitroGLYCERIN 0.4 MG SL tablet Commonly known as: NITROSTAT Place 1 tablet (0.4 mg total) under the tongue every 5 (five) minutes as needed for chest pain (CP or SOB).   Sterile Saline Soln Use saily  to flush trach after use of Inhaler Virgel Bouquet)         Follow-up: Return in about 6 months (around 02/05/2024).  Mechele Claude, M.D.

## 2023-08-09 ENCOUNTER — Telehealth: Payer: Self-pay | Admitting: Cardiovascular Disease

## 2023-08-09 ENCOUNTER — Telehealth: Payer: Self-pay | Admitting: Family Medicine

## 2023-08-09 LAB — CBC WITH DIFFERENTIAL/PLATELET
Basophils Absolute: 0.1 10*3/uL (ref 0.0–0.2)
Basos: 1 %
EOS (ABSOLUTE): 0.1 10*3/uL (ref 0.0–0.4)
Eos: 2 %
Hematocrit: 42.1 % (ref 37.5–51.0)
Hemoglobin: 13.7 g/dL (ref 13.0–17.7)
Immature Grans (Abs): 0 10*3/uL (ref 0.0–0.1)
Immature Granulocytes: 0 %
Lymphocytes Absolute: 1.9 10*3/uL (ref 0.7–3.1)
Lymphs: 27 %
MCH: 30.3 pg (ref 26.6–33.0)
MCHC: 32.5 g/dL (ref 31.5–35.7)
MCV: 93 fL (ref 79–97)
Monocytes Absolute: 0.8 10*3/uL (ref 0.1–0.9)
Monocytes: 11 %
Neutrophils Absolute: 4.3 10*3/uL (ref 1.4–7.0)
Neutrophils: 59 %
Platelets: 211 10*3/uL (ref 150–450)
RBC: 4.52 x10E6/uL (ref 4.14–5.80)
RDW: 11.9 % (ref 11.6–15.4)
WBC: 7.2 10*3/uL (ref 3.4–10.8)

## 2023-08-09 LAB — CMP14+EGFR
ALT: 16 IU/L (ref 0–44)
AST: 24 IU/L (ref 0–40)
Albumin: 4.2 g/dL (ref 3.8–4.8)
Alkaline Phosphatase: 79 IU/L (ref 44–121)
BUN/Creatinine Ratio: 16 (ref 10–24)
BUN: 16 mg/dL (ref 8–27)
Bilirubin Total: 1.1 mg/dL (ref 0.0–1.2)
CO2: 25 mmol/L (ref 20–29)
Calcium: 9.2 mg/dL (ref 8.6–10.2)
Chloride: 103 mmol/L (ref 96–106)
Creatinine, Ser: 1 mg/dL (ref 0.76–1.27)
Globulin, Total: 2.4 g/dL (ref 1.5–4.5)
Glucose: 89 mg/dL (ref 70–99)
Potassium: 4.2 mmol/L (ref 3.5–5.2)
Sodium: 142 mmol/L (ref 134–144)
Total Protein: 6.6 g/dL (ref 6.0–8.5)
eGFR: 78 mL/min/{1.73_m2} (ref >59)

## 2023-08-09 LAB — VITAMIN D 25 HYDROXY (VIT D DEFICIENCY, FRACTURES): Vit D, 25-Hydroxy: 33.5 ng/mL (ref 30.0–100.0)

## 2023-08-09 LAB — LIPID PANEL
Chol/HDL Ratio: 2.8 ratio (ref 0.0–5.0)
Cholesterol, Total: 138 mg/dL (ref 100–199)
HDL: 50 mg/dL (ref >39)
LDL Chol Calc (NIH): 67 mg/dL (ref 0–99)
Triglycerides: 118 mg/dL (ref 0–149)
VLDL Cholesterol Cal: 21 mg/dL (ref 5–40)

## 2023-08-09 NOTE — Telephone Encounter (Signed)
Pt's spouse is requesting a callback regarding pt finding out yesterday while seeing his PCP he has an aneurysm in his chest. Well yesterday he was very dizzy and now today he's still very dizzy and losing balance. She stated he almost fell today and actually did fall last week. She'd like a callback to discuss further. Please advise

## 2023-08-09 NOTE — Telephone Encounter (Signed)
Cory Sloan called to make sure that pt and Dr Darlyn Read talked about pts dizzy spells that he has been having, at his visit with Dr Darlyn Read yesterday.   Do not see anything in regards to dizziness, noted in the chart. Cory Sloan says she talked with Dr Darlyn Read about this recently in case pt did forgot to mention it to him at his appt.  Needs advise.

## 2023-08-09 NOTE — Telephone Encounter (Signed)
Aware Dr. Darlyn Read is not in the office today.  Please advise if this was discussed?

## 2023-08-09 NOTE — Telephone Encounter (Signed)
Wife states at his appt with PCP he was noted to have aortic aneurism.  CY has been ordered to stage.  She is not sure where this came from as the PCP stated HX of aneurism.  She is confused and has multiple questions.  Advised many need to be brought to the attention of PCP but that she can come to cardiology for F/U.  She wants to come prior to CT as she is worried and wants to discuss

## 2023-08-10 ENCOUNTER — Other Ambulatory Visit: Payer: Self-pay

## 2023-08-10 ENCOUNTER — Emergency Department (HOSPITAL_COMMUNITY)
Admission: EM | Admit: 2023-08-10 | Discharge: 2023-08-10 | Disposition: A | Discharge status: Home / Self Care | Payer: Medicare HMO | Attending: Emergency Medicine | Admitting: Emergency Medicine

## 2023-08-10 ENCOUNTER — Emergency Department (HOSPITAL_COMMUNITY): Payer: Medicare HMO

## 2023-08-10 ENCOUNTER — Encounter (HOSPITAL_COMMUNITY): Payer: Self-pay

## 2023-08-10 DIAGNOSIS — Z85818 Personal history of malignant neoplasm of other sites of lip, oral cavity, and pharynx: Secondary | ICD-10-CM | POA: Diagnosis not present

## 2023-08-10 DIAGNOSIS — K5730 Diverticulosis of large intestine without perforation or abscess without bleeding: Secondary | ICD-10-CM | POA: Diagnosis not present

## 2023-08-10 DIAGNOSIS — J9811 Atelectasis: Secondary | ICD-10-CM | POA: Diagnosis not present

## 2023-08-10 DIAGNOSIS — Z7982 Long term (current) use of aspirin: Secondary | ICD-10-CM | POA: Insufficient documentation

## 2023-08-10 DIAGNOSIS — I714 Abdominal aortic aneurysm, without rupture, unspecified: Secondary | ICD-10-CM | POA: Insufficient documentation

## 2023-08-10 DIAGNOSIS — I10 Essential (primary) hypertension: Secondary | ICD-10-CM | POA: Insufficient documentation

## 2023-08-10 DIAGNOSIS — Z79899 Other long term (current) drug therapy: Secondary | ICD-10-CM | POA: Insufficient documentation

## 2023-08-10 DIAGNOSIS — Z87891 Personal history of nicotine dependence: Secondary | ICD-10-CM | POA: Insufficient documentation

## 2023-08-10 DIAGNOSIS — I7121 Aneurysm of the ascending aorta, without rupture: Secondary | ICD-10-CM | POA: Diagnosis not present

## 2023-08-10 DIAGNOSIS — I712 Thoracic aortic aneurysm, without rupture, unspecified: Secondary | ICD-10-CM | POA: Insufficient documentation

## 2023-08-10 DIAGNOSIS — N400 Enlarged prostate without lower urinary tract symptoms: Secondary | ICD-10-CM | POA: Diagnosis not present

## 2023-08-10 DIAGNOSIS — Z7902 Long term (current) use of antithrombotics/antiplatelets: Secondary | ICD-10-CM | POA: Diagnosis not present

## 2023-08-10 DIAGNOSIS — Z955 Presence of coronary angioplasty implant and graft: Secondary | ICD-10-CM | POA: Diagnosis not present

## 2023-08-10 DIAGNOSIS — R079 Chest pain, unspecified: Secondary | ICD-10-CM | POA: Diagnosis not present

## 2023-08-10 DIAGNOSIS — R0789 Other chest pain: Secondary | ICD-10-CM | POA: Diagnosis not present

## 2023-08-10 DIAGNOSIS — J449 Chronic obstructive pulmonary disease, unspecified: Secondary | ICD-10-CM | POA: Insufficient documentation

## 2023-08-10 LAB — CBC
HCT: 41 % (ref 39.0–52.0)
Hemoglobin: 13.1 g/dL (ref 13.0–17.0)
MCH: 30.3 pg (ref 26.0–34.0)
MCHC: 32 g/dL (ref 30.0–36.0)
MCV: 94.9 fL (ref 80.0–100.0)
Platelets: 199 10*3/uL (ref 150–400)
RBC: 4.32 MIL/uL (ref 4.22–5.81)
RDW: 12.8 % (ref 11.5–15.5)
WBC: 5.5 10*3/uL (ref 4.0–10.5)
nRBC: 0 % (ref 0.0–0.2)

## 2023-08-10 LAB — BASIC METABOLIC PANEL
Anion gap: 8 (ref 5–15)
BUN: 12 mg/dL (ref 8–23)
CO2: 26 mmol/L (ref 22–32)
Calcium: 8.8 mg/dL — ABNORMAL LOW (ref 8.9–10.3)
Chloride: 104 mmol/L (ref 98–111)
Creatinine, Ser: 0.96 mg/dL (ref 0.61–1.24)
GFR, Estimated: >60 mL/min (ref >60)
Glucose, Bld: 101 mg/dL — ABNORMAL HIGH (ref 70–99)
Potassium: 4.2 mmol/L (ref 3.5–5.1)
Sodium: 138 mmol/L (ref 135–145)

## 2023-08-10 LAB — TROPONIN I (HIGH SENSITIVITY)
Troponin I (High Sensitivity): 11 ng/L (ref <18)
Troponin I (High Sensitivity): 13 ng/L (ref <18)

## 2023-08-10 MED ORDER — IOHEXOL 350 MG/ML SOLN
100.0000 mL | Freq: Once | INTRAVENOUS | Status: AC | PRN
  Administered 2023-08-10: 100 mL via INTRAVENOUS

## 2023-08-10 NOTE — Discharge Instructions (Addendum)
Please follow-up with your cardiologist  Continue to abstain from tobacco use  Your CT imaging today shows roughly unchanged diameter of your thoracic aortic aneurysm.  Please follow-up in 1 year for repeat imaging.  You also have aneurysm of your abdominal aorta, please follow-up with ultrasound approximately 3 years for surveillance    It was a pleasure caring for you today in the emergency department.  Please return to the emergency department for any worsening or worrisome symptoms.

## 2023-08-10 NOTE — ED Provider Notes (Signed)
[facility] AT [facility] Provider Note  CSN: [id number] Arrival date & time: 08/10/23 1014  Chief Complaint(s) Chest Pain  HPI Cory Sloan is a 77 y.o. male with past medical history as below, significant for carotid artery gnosis, COPD, HLD, throat cancer status post laryngectomy, prior tobacco use, thoracic or aneurysm who presents to the ED with complaint of concern over thoracic aneurysm  Reports he was seen by his PCP recently, was told that he was previously diagnosed with a thoracic aortic aneurysm.  Patient reports he did not know if his history of this aneurysm is very worried.  He denies any chest pain or palpitations or dyspnea.  Called his cardiologist office and they scheduled him for a CT of his chest.  Patient was concerned over the timeline of getting the imaging done and came here for evaluation.  He denies any syncopal episodes, numbness or tingling to extremities, no chest pain or dyspnea.  No nausea or vomiting, fevers chills, compliant with home medications.  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 Authorizing Provider  aspirin EC 81 MG EC tablet Take 1 tablet (81 mg total) by mouth daily. 10/07/15   Bhagat, Sharrell Ku, PA  atorvastatin (LIPITOR) 80 MG tablet TAKE 1 TABLET (80 MG TOTAL) BY MOUTH DAILY AT 6 PM. 02/05/23   Mechele Claude, MD  carvedilol (COREG) 3.125 MG tablet Take 1 tablet (3.125 mg total) by mouth 2 (two) times daily with a meal. 08/08/23   Mechele Claude, MD  clopidogrel (PLAVIX) 75 MG tablet Take 1 tablet (75 mg total) by mouth daily. 08/08/23   Mechele Claude, MD  ezetimibe (ZETIA) 10 MG tablet Take 1 tablet (10 mg total) by mouth daily. 08/08/23 11/06/23  Mechele Claude, MD  nitroGLYCERIN (NITROSTAT) 0.4 MG SL tablet Place 1 tablet (0.4 mg total) under the tongue every 5 (five) minutes as needed for chest pain (CP or SOB). 08/07/22   Mechele Claude, MD  Soft Lens Products (STERILE SALINE) SOLN Use saily to flush trach after use of Inhaler Virgel Bouquet) 12/16/18   Mechele Claude, MD  Past Surgical History Past Surgical History:  Procedure Laterality Date   APPENDECTOMY     CARDIAC CATHETERIZATION N/A 10/04/2015   Procedure: Left Heart Cath and Coronary Angiography;  Surgeon: Lennette Bihari, MD;  Location: [facility] INVASIVE CV LAB;  Service: Cardiovascular;  Laterality: N/A;   CARDIAC CATHETERIZATION N/A 10/04/2015   Procedure: Coronary Stent Intervention;  Surgeon: Lennette Bihari, MD;  Location: [facility] INVASIVE CV LAB;  Service: Cardiovascular;  Laterality: N/A;   COLONOSCOPY  12/23/2019   THROAT SURGERY  2005   [place]   Family History Family History  Problem Relation Age of Onset   Cancer - Other Mother    Breast cancer Mother    Brain cancer Father    CAD Brother     Colon polyps Brother    Colon cancer Neg Hx    Esophageal cancer Neg Hx    Rectal cancer Neg Hx    Stomach cancer Neg Hx     Social History Social History   Tobacco Use   Smoking status: Former    Current packs/day: 0.00    Average packs/day: 1.5 packs/day for 40.0 years (60.0 ttl pk-yrs)    Types: Cigarettes    Start date: 08/20/1963    Quit date: 08/20/2003    Years since quitting: 19.9   Smokeless tobacco: Never  Vaping Use   Vaping status: Never Used  Substance Use Topics   Alcohol use: No    Alcohol/week: 0.0 standard drinks of alcohol   Drug use: No   Allergies Patient has no known allergies.  Review of Systems Review of Systems  Constitutional:  Negative for chills and fever.  Respiratory:  Negative for chest tightness and shortness of breath.   Cardiovascular:  Negative for chest pain and palpitations.  Genitourinary:  Negative for difficulty urinating and dysuria.  Neurological:  Negative for syncope and numbness.  All other systems reviewed and are negative.   Physical Exam Vital Signs  I have reviewed the triage vital signs BP (!) 142/84   Pulse 63   Temp 98.1 F (36.7 C) (Oral)   Resp 16   Ht 5\' 10"  (1.778 m)   Wt 71.9 kg   SpO2 100%   BMI 22.74 kg/m  Physical Exam Vitals and nursing note reviewed.  Constitutional:      General: He is not in acute distress.    Appearance: He is well-developed.  HENT:     Head: Normocephalic and atraumatic.     Comments: Laryngectomy    Right Ear: External ear normal.     Left Ear: External ear normal.     Mouth/Throat:     Mouth: Mucous membranes are moist.  Eyes:     General: No scleral icterus. Cardiovascular:     Rate and Rhythm: Normal rate and regular rhythm.     Pulses: Normal pulses.     Heart sounds: Normal heart sounds.  Pulmonary:     Effort: Pulmonary effort is normal. No respiratory distress.     Breath sounds: Normal breath sounds.  Abdominal:     General: Abdomen is flat.      Palpations: Abdomen is soft.     Tenderness: There is no abdominal tenderness.  Musculoskeletal:     Cervical back: No rigidity.     Right lower leg: No edema.     Left lower leg: No edema.  Skin:    General: Skin is warm and dry.     Capillary Refill: Capillary refill takes less than 2  seconds.  Neurological:     Mental Status: He is alert.  Psychiatric:        Mood and Affect: Mood normal.        Behavior: Behavior normal.     ED Results and Treatments Labs (all labs ordered are listed, but only abnormal results are displayed) Labs Reviewed  BASIC METABOLIC PANEL - Abnormal; Notable for the following components:      Result Value   Glucose, Bld 101 (*)    Calcium 8.8 (*)    All other components within normal limits  CBC  TROPONIN I (HIGH SENSITIVITY)  TROPONIN I (HIGH SENSITIVITY)                                                                                                                          Radiology CT Angio Chest/Abd/Pel for Dissection W and/or Wo Contrast  Result Date: 08/10/2023 CLINICAL DATA:  History of ascending thoracic aortic aneurysm. Midsternal chest pain for 2 weeks EXAM: CT ANGIOGRAPHY CHEST, ABDOMEN AND PELVIS TECHNIQUE: Non-contrast CT of the chest was initially obtained. Multidetector CT imaging through the chest, abdomen and pelvis was performed using the standard protocol during bolus administration of intravenous contrast. Multiplanar reconstructed images and MIPs were obtained and reviewed to evaluate the vascular anatomy. RADIATION DOSE REDUCTION: This exam was performed according to the departmental dose-optimization program which includes automated exposure control, adjustment of the mA and/or kV according to patient size and/or use of iterative reconstruction technique. CONTRAST:  OMNIPAQUE IOHEXOL 350 MG/ML SOLN COMPARISON:  CT 03/10/2017 FINDINGS: CTA CHEST FINDINGS Cardiovascular: Noncontrast CT of the chest demonstrates no evidence of an  aortic intramural hematoma. Satisfactory opacification of the thoracic aorta. No thoracic aortic dissection. Mid ascending thoracic aorta measures up to 4.0 cm in diameter. Three vessel arch with widely patent arch vessels. Scattered atherosclerotic calcification of the aorta and coronary arteries. Central pulmonary vasculature is within normal limits. No central pulmonary arterial filling defects. Normal heart size. No pericardial effusion. Mediastinum/Nodes: No enlarged mediastinal, hilar, or axillary lymph nodes. Thyroid gland, trachea, and esophagus demonstrate no significant findings. Status post laryngectomy. Lungs/Pleura: Moderate centrilobular emphysema. Mild dependent bibasilar subsegmental atelectasis. Lungs are otherwise clear. No pleural effusion or pneumothorax. Musculoskeletal: No chest wall abnormality. No acute or significant osseous findings. Degenerative changes of both shoulders. Review of the MIP images confirms the above findings. CTA ABDOMEN AND PELVIS FINDINGS VASCULAR Aorta: Distal aspect of the infrarenal abdominal aorta measures 3.0 cm in diameter. Atherosclerotic calcification with mural thrombus at the aneurysm site. No dissection. Celiac: Patent without evidence of aneurysm, dissection, vasculitis or significant stenosis. SMA: Patent without evidence of aneurysm, dissection, vasculitis or significant stenosis. Renals: Both renal arteries are patent without evidence of aneurysm, dissection, vasculitis, fibromuscular dysplasia or significant stenosis. IMA: Patent. Inflow: Patent without evidence of aneurysm, dissection, vasculitis or significant stenosis. Veins: No obvious venous abnormality within the limitations of this arterial phase study. Review of the MIP images confirms the above findings.  NON-VASCULAR Hepatobiliary: No focal liver abnormality is seen. No gallstones, gallbladder wall thickening, or biliary dilatation. Pancreas: Unremarkable. No pancreatic ductal dilatation or  surrounding inflammatory changes. Spleen: Normal in size without focal abnormality. Adrenals/Urinary Tract: Adrenal glands are unremarkable. Kidneys are normal, without renal calculi, focal lesion, or hydronephrosis. Bladder is unremarkable. Stomach/Bowel: Stomach is within normal limits. Scattered colonic diverticulosis. No evidence of bowel wall thickening, distention, or inflammatory changes. Lymphatic: No abdominopelvic lymphadenopathy. Reproductive: Mildly enlarged prostate gland. Other: No abdominal wall hernia or abnormality. No abdominopelvic ascites. Musculoskeletal: Degenerative lower lumbar spondylosis with grade 1 anterolisthesis of L4 on L5. No acute bony abnormality. Review of the MIP images confirms the above findings. IMPRESSION: 1. No acute abdominopelvic findings. Negative for aortic dissection. 2. Ascending thoracic aortic aneurysm measuring 4.0 cm in diameter. Recommend annual imaging followup by CTA or MRA. This recommendation follows 2010 ACCF/AHA/AATS/ACR/ASA/SCA/SCAI/SIR/STS/SVM Guidelines for the Diagnosis and Management of Patients with Thoracic Aortic Disease. Circulation. 2010; 121: Y782-N562. Aortic aneurysm NOS (ICD10-I71.9) 3. Infrarenal abdominal aortic aneurysm measuring 3.0 cm in diameter. Recommend follow-up ultrasound every 3 years. (Ref.: J Vasc Surg. 2018; 67:2-77 and J Am Coll Radiol 2013;10(10):789-794.) Aortic Atherosclerosis (ICD10-I70.0) and Emphysema (ICD10-J43.9). Electronically Signed   By: Duanne Guess D.O.   On: 08/10/2023 13:26   DG Chest 2 View  Result Date: 08/10/2023 CLINICAL DATA:  Chest pain EXAM: CHEST - 2 VIEW COMPARISON:  March 10, 2017 FINDINGS: The cardiomediastinal silhouette is unchanged in contour.Coronary stent. Emphysematous changes. No pleural effusion. No pneumothorax. No acute pleuroparenchymal abnormality. Visualized abdomen is unremarkable. Multilevel degenerative changes of the thoracic spine. IMPRESSION: No acute cardiopulmonary  abnormality. Electronically Signed   By: Meda Klinefelter M.D.   On: 08/10/2023 12:10    Pertinent labs & imaging results that were available during my care of the patient were reviewed by me and considered in my medical decision making (see MDM for details).  Medications Ordered in ED Medications  iohexol (OMNIPAQUE) 350 MG/ML injection 100 mL (100 mLs Intravenous Contrast Given 08/10/23 1252)                                                                                                                                     Procedures Procedures  (including critical care time)  Medical Decision Making / ED Course    Medical Decision Making:    FAYNE AHN is a 77 y.o. male with past medical history as below, significant for carotid artery gnosis, COPD, HLD, throat cancer status post laryngectomy, prior tobacco use, thoracic or aneurysm who presents to the ED with complaint of concern over thoracic aneurysm. The complaint involves an extensive differential diagnosis and also carries with it a high risk of complications and morbidity.  Serious etiology was considered.  Complete initial physical exam performed, notably the patient  was no acute distress, sitting upright.    Reviewed and confirmed nursing documentation for past medical history, family history,  social history.  Vital signs reviewed.        Patient reports he is asymptomatic  Viewed prior imaging, he had a CTA 2018 concerning for thoracic aneurysm in addition to a carotid dissection.  He has not had repeat imaging that I am able to see, patient denies any further CT imaging of his chest.  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   Ascending thoracic aorta measures 4 cm. Recommend annual imaging followup by CTA or MRA. This recommendation follows  2010 ACCF/AHA/AATS/ACR/ASA/SCA/SCAI/SIR/STS/SVM Guidelines for the Diagnosis and Management of Patients with Thoracic Aortic Disease. Circulation. 2010; 121: G295-M841   No acute process identified within the chest, abdomen or pelvis."   Angio stable.  He is asymptomatic.  Labs stable.  Recommend he follow-up with PCP and with cardiothoracic surgery.  Repeat imaging in 1 year for the chest in 3 years for the abdomen. Tobacco cessation. He is agreeable.   The patient improved significantly and was discharged in stable condition. Detailed discussions were had with the patient regarding current findings, and need for close f/u with PCP or on call doctor. The patient has been instructed to return immediately if the symptoms worsen in any way for re-evaluation. Patient verbalized understanding and is in agreement with current care plan. All questions answered prior to discharge.             Additional history obtained: -Additional history obtained from na -External records from outside source obtained and reviewed including: Chart review including previous notes, labs, imaging, consultation notes including  Documentations, prior labs and imaging   Lab Tests: -I ordered, reviewed, and interpreted labs.   The pertinent results include:   Labs Reviewed  BASIC METABOLIC PANEL - Abnormal; Notable for the following components:      Result Value   Glucose, Bld 101 (*)    Calcium 8.8 (*)    All other components within normal limits  CBC  TROPONIN I (HIGH SENSITIVITY)  TROPONIN I (HIGH SENSITIVITY)    Notable for stable  EKG   EKG Interpretation Date/Time:    Ventricular Rate:    PR Interval:    QRS Duration:    QT Interval:    QTC Calculation:   R Axis:      Text Interpretation:           Imaging Studies ordered: I ordered imaging studies including CTA/ cxr I independently visualized the following imaging with scope of interpretation limited to determining acute  life threatening conditions related to emergency care; findings noted above, significant for stable aneurysm I independently visualized and interpreted imaging. I agree with the radiologist interpretation   Medicines ordered and prescription drug management: Meds ordered this encounter  Medications   iohexol (OMNIPAQUE) 350 MG/ML injection 100 mL    -I have reviewed the patients home medicines and have made adjustments as needed   Consultations Obtained: na   Cardiac Monitoring: The patient was maintained on a cardiac monitor.  I personally viewed and interpreted the cardiac monitored which showed an underlying rhythm of: NSR  Social Determinants of Health:  Diagnosis or treatment significantly limited by social determinants of health: former smoker   Reevaluation: After the interventions noted above, I reevaluated the patient and found that they have stayed the same  Co morbidities that complicate the patient evaluation  Past Medical History:  Diagnosis Date   Carotid artery stenosis    1-39% bilateral stenosis   COPD (chronic obstructive pulmonary disease) (HCC)    HLD (hyperlipidemia)    ST elevation (STEMI) myocardial infarction involving right coronary artery (HCC) 10/04/2015   STEMI (ST elevation myocardial infarction) [facility]) November 2016   with PCI to RCA and associated cardiogenic shock   Throat cancer (HCC)    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    Final Clinical Impression(s) / ED Diagnoses Final diagnoses:  Thoracic aortic aneurysm without rupture, unspecified part (HCC)  Abdominal aortic aneurysm (AAA) without rupture, unspecified part (HCC)        Sloan Leiter, DO 08/10/23 1411

## 2023-08-10 NOTE — ED Notes (Signed)
Patient transported to x-ray. ?

## 2023-08-10 NOTE — ED Triage Notes (Addendum)
Pt c/o int midsternal chest painx1-2wks. Pt denies any other sx. Pt states went to PCP and the provider told him he thought he may have a thoracic aortic aneurysm, but the pt states the provider didn't do any tests and he doesn't understand how the provider thinks he has an aneurysm. The pt states he got scared and came here.

## 2023-08-11 MED ORDER — MECLIZINE HCL 25 MG PO TABS: 50.0000 mg | ORAL_TABLET | Freq: Three times a day (TID) | ORAL | 2 refills | Status: DC | PRN

## 2023-08-11 NOTE — Telephone Encounter (Signed)
Please let the patient know that I sent their prescription to their pharmacy. Thanks, WS

## 2023-08-11 NOTE — Addendum Note (Signed)
Addended by: Mechele Claude on: 08/11/2023 08:50 AM   Modules accepted: Orders

## 2023-08-13 NOTE — Progress Notes (Signed)
Hello Kyris,  Your lab result is normal and/or stable.Some minor variations that are not significant are commonly marked abnormal, but do not represent any medical problem for you.  Best regards, Mechele Claude, M.D.

## 2023-08-14 ENCOUNTER — Ambulatory Visit: Payer: Medicare HMO | Admitting: Nurse Practitioner

## 2023-08-17 ENCOUNTER — Ambulatory Visit (HOSPITAL_BASED_OUTPATIENT_CLINIC_OR_DEPARTMENT_OTHER): Payer: Medicare HMO

## 2023-09-13 ENCOUNTER — Telehealth: Payer: Self-pay

## 2023-09-13 NOTE — Telephone Encounter (Signed)
 Transition Care Management Unsuccessful Follow-up Telephone Call  Date of discharge and from where:  [facility] 9/21  Attempts:  1st Attempt  Reason for unsuccessful TCM follow-up call:  No answer/busy   Lenard Forth [place]  [facility], [facility] Guide, Phone: [phone] Website: [email or web address]

## 2023-09-13 NOTE — Telephone Encounter (Signed)
Transition Care Management Unsuccessful Follow-up Telephone Call  Date of discharge and from where:  [facility] 9/21  Attempts:  2nd Attempt  Reason for unsuccessful TCM follow-up call:  No answer/busy   Lenard Forth [place]  [facility], [facility] Guide, Phone: [phone] Website: [email or web address]

## 2023-09-27 ENCOUNTER — Encounter: Payer: Self-pay | Admitting: Nurse Practitioner

## 2023-09-27 ENCOUNTER — Ambulatory Visit (INDEPENDENT_AMBULATORY_CARE_PROVIDER_SITE_OTHER): Payer: Medicare HMO | Admitting: Nurse Practitioner

## 2023-09-27 VITALS — BP 135/87 | HR 72 | Temp 97.2°F | Resp 20 | Ht 70.0 in | Wt 157.0 lb

## 2023-09-27 DIAGNOSIS — S0003XA Contusion of scalp, initial encounter: Secondary | ICD-10-CM

## 2023-09-27 NOTE — Patient Instructions (Signed)
Lymphadenopathy  Lymphadenopathy is when your lymph glands are swollen or larger than normal.  Lymph glands, also called lymph nodes, are clumps of tissue. They filter germs and waste from tissues in your body to your bloodstream. They're part of your body's defense system, or immune system. Lymphadenopathy has different causes, like infection, autoimmune disease, and cancer. Lymphadenopathy can happen wherever you have lymph nodes. The type you have depends on which nodes it's in, such as: Cervical lymphadenopathy. This is in the neck. Mediastinal lymphadenopathy. This is in the chest. Hilar lymphadenopathy. This is in the lungs. Axillary lymphadenopathy. This is in the armpits. Inguinal lymphadenopathy. This is in the groin. Sometimes, fluid and cells that fight infection build up in your lymph nodes. This happens when your immune system reacts to germs or other substances that get into your body. This makes lymph nodes swell and get bigger. Treatment is based on what's thought to be the cause. Sometimes, lymph nodes don't go back to normal size after treatment. If yours don't, your health care provider may order tests to help learn why your glands are still swollen and big. Follow these instructions at home:  Take over-the-counter and prescription medicines only as told by your provider. If you were prescribed antibiotics, do not stop using them, even if you start to feel better. If told, apply heat to swollen lymph nodes as told by your provider. Use the heat source that your provider recommends, such as a moist heat pack or a heating pad. Place a towel between your skin and the heat source. Leave the heat on only for the time told by your provider to avoid injury. If your skin turns bright red, remove the heat right away to prevent burns. The risk of burns is higher if you cannot feel pain, heat, or cold. Check your swollen lymph nodes every day for changes. Check other places where you have  lymph nodes as told. Check for changes such as: More swelling. Sudden growth in size. Redness or pain. Hardness. Contact a health care provider if: You have lymph nodes that: Are still swollen after 2 weeks. Have gotten bigger all of a sudden or the swelling spreads. Are red, painful, or hard. Fluid leaks from the skin near a swollen lymph node. You get a fever, chills, or night sweats. You feel tired. You have a sore throat. Your abdomen hurts. You lose weight without trying. This information is not intended to replace advice given to you by your health care provider. Make sure you discuss any questions you have with your health care provider. Document Revised: 01/30/2023 Document Reviewed: 01/30/2023 Elsevier Patient Education  2024 [employer].

## 2023-09-27 NOTE — Progress Notes (Signed)
   Subjective:    Patient ID: Cory Sloan, male    DOB: [birth year], 77 y.o.   MRN: [id number]   Chief Complaint: Head is sore (Had fall 2 weeks ago and hit head)   HPI  Patient came in c/o head sore. He fell 2 weeks ago and hit his head. He denies headache or dizziness. No blurred vision. But he says his scalp feels sore to the touch. Patient Active Problem List   Diagnosis Date Noted   Essential hypertension 01/08/2021   History of laryngeal cancer 11/23/2019   Carotid artery stenosis    Abdominal aortic atherosclerosis (HCC) 03/11/2017   Thoracic aortic aneurysm without rupture (HCC) 03/11/2017   Coronary artery disease involving native coronary artery of native heart without angina pectoris    Hyperlipidemia LDL goal <70 03/11/2016   Chronic obstructive pulmonary disease (HCC)        Review of Systems  Eyes:  Negative for photophobia and visual disturbance.  Neurological:  Negative for dizziness, syncope, weakness, light-headedness and headaches.       Objective:   Physical Exam Constitutional:      Appearance: Normal appearance.  Cardiovascular:     Rate and Rhythm: Normal rate and regular rhythm.     Heart sounds: Normal heart sounds.  Pulmonary:     Effort: Pulmonary effort is normal.     Breath sounds: Normal breath sounds.  Skin:    Comments: No abnormality to scalp at located injury. Mildly sore to touch  Neurological:     General: No focal deficit present.     Mental Status: He is alert and oriented to person, place, and time.     Cranial Nerves: No cranial nerve deficit.     Sensory: No sensory deficit.     Coordination: Coordination normal.     Deep Tendon Reflexes: Reflexes normal.  Psychiatric:        Mood and Affect: Mood normal.        Behavior: Behavior normal.    BP 135/87   Pulse 72   Temp (!) 97.2 F (36.2 C) (Temporal)   Resp 20   Ht 5\' 10"  (1.778 m)   Wt 157 lb (71.2 kg)   SpO2 98%   BMI 22.53 kg/m         Assessment &  Plan:   Cory Sloan in today with chief complaint of Head is sore (Had fall 2 weeks ago and hit head)   1. Contusion of scalp, initial encounter Ice area if helps RTO prn    The above assessment and management plan was discussed with the patient. The patient verbalized understanding of and has agreed to the management plan. Patient is aware to call the clinic if symptoms persist or worsen. Patient is aware when to return to the clinic for a follow-up visit. Patient educated on when it is appropriate to go to the emergency department.   Mary-Margaret Daphine Deutscher, FNP

## 2023-12-31 ENCOUNTER — Ambulatory Visit: Payer: Medicare HMO | Attending: Cardiovascular Disease | Admitting: Cardiovascular Disease

## 2023-12-31 VITALS — BP 120/80 | HR 58 | Ht 71.0 in | Wt 158.0 lb

## 2023-12-31 DIAGNOSIS — I2510 Atherosclerotic heart disease of native coronary artery without angina pectoris: Secondary | ICD-10-CM

## 2023-12-31 DIAGNOSIS — Z9002 Acquired absence of larynx: Secondary | ICD-10-CM | POA: Diagnosis not present

## 2023-12-31 DIAGNOSIS — I2119 ST elevation (STEMI) myocardial infarction involving other coronary artery of inferior wall: Secondary | ICD-10-CM

## 2023-12-31 DIAGNOSIS — J449 Chronic obstructive pulmonary disease, unspecified: Secondary | ICD-10-CM | POA: Diagnosis not present

## 2023-12-31 DIAGNOSIS — E785 Hyperlipidemia, unspecified: Secondary | ICD-10-CM

## 2023-12-31 DIAGNOSIS — Z85819 Personal history of malignant neoplasm of unspecified site of lip, oral cavity, and pharynx: Secondary | ICD-10-CM | POA: Diagnosis not present

## 2023-12-31 NOTE — Progress Notes (Unsigned)
 Patient ID: Cory Sloan, male   DOB: 03-May-1946, 78 y.o.   MRN: [id number]      HPI: Cory Sloan is a 78 y.o. male who presents to the office today for a 4 month follow-up cardiology evaluation.  Cory Sloan presented to [facility] 10/04/2015 with an acute ST segment elevation myocardial infarction secondary to total proximal occlusion of a large, dominant RCA.  He had a prior history of tobacco use and COPD in 2005 underwent laryngectomy for laryngeal CA.  There was no prior history of ischemic heart disease.  He was taken emergently to the catheterization laboratory and underwent successful PCI/DES stenting of a large dominant RCA with ultimate insertion of a 4.032 mm Synergy DES stent postdilated to 4.6 mm with the 100 percent occlusion reduced to 0% and restoration of brisk TIMI-3 flow.  Initial ejection fraction was 50% with mild mid inferior wall hypocontractility.  Post catheterization he developed cardiogenic shock and hypotension, which initially was refractory to IV fluid resuscitation and required dopamine.  A chest CT showed emphysematous disease.  He was seen by Meyer Russel on 2 occasions post hospitalization with the most recent being in December 2016.  An echo Doppler study at that time showed an EF of 50-55% with akinesis of the basal inferior myocardium and grade 1 diastolic dysfunction.  There was mild aortic sclerosis, mild mitral and trivial tricuspid regurgitation.  Blood pressure has been low and as result, he is only been able to be on very minimal medical therapy consisting of carvedilol one half of a 3.125 mg pill twice a day.  Initially he was on Brilinta but this was changed to Plavix due to shortness of breath issues which were felt possibly contributed to Brilinta.  A P2Y12 blood test was 210.  When I saw him in 2017, his LDL had risen to 146 and at that time recommended the addition of Zetia to his atorvastatin.  Apparently he had not taken the atorvastatin for  month prior to that blood work.  As result, he resume taking the atorvastatin at 80 mg daily and never started the Zetia.    He was evaluated in the emergency room on 03/10/2017 with chest pain.  The day prior to this episode, he had been pulling heavy dishes.  The following night he noticed a retrosternal chest discomfort that was different from his prior cardiac pain.  He presented to the emergency room.  Troponin was negative.  He underwent a CT angios an incidental finding of a right common iliac artery dissection in a short segment without associated decrease in pulse, pain, or paresthesias was notified.  The images were subtle.  Reviewed by Dr. Randie Heinz of vascular surgery who felt that the imaging did not represent dissection.  It was felt that the patient's chest pain was due to his heavy lifting the day previously.  An echo Doppler study was done during that evaluation, which showed an EF of 55-60%. There was grade 1 diastolic dysfunction.  There is mild left atrial dilatation.  He underwent a nuclear stress test on 03/20/2017.  This was a low risk study showing an EF of 46%.  Findings were consistent with prior MI.  There was no ischemia.  Subsequently, he has been without any chest pain.  He continues to be active.  He denies shortness of breath.    I saw him in May 2018, subsequently, he was evaluated by several falls or APP's and saw Cory Sloan, Cory Sloan in January 2021  and Cory Fabian, Cory Sloan in June 2021.  He continues to feel well.  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  He had Covid in 2020.    I saw him for 3-year follow-up evaluation in October 2021.  At that time he denied any chest pain or significant shortness of breath and was unaware of palpitations.  Cory Sloan in January 2021 showed an LDL cholesterol at 69.  During that evaluation, he was clinically stable and I recommended he undergo a 4-year follow-up echo Doppler study particularly in follow-up of his  Covid infection.  I last saw him on February 08, 2021 when he was worked in with complaints of very sharp left-sided stabbing chest pain at the left axillary line near the diaphragm pleural space.  The pain was instantaneous but almost brought tears to his eyes. Cory Sloan has not yet had his echocardiogram which was scheduled in early April.  He denied any exertional chest discomfort.  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  He does not recall when his last chest x-ray was.  His primary physician had rechecked laboratory in February 2022 and LDL cholesterol had risen to 82 despite taking atorvastatin 80 mg.  He continues to be on carvedilol 3.125 mg twice a day and has been on long-term DAPT with aspirin/Plavix.  During that evaluation, I felt his chest pain was most likely musculoskeletal in etiology or possibly pleuritic.  I recommended he undergo a PA lateral chest x-ray.  In addition with his CAD I recommended the addition of Zetia 10 mg to be added to his atorvastatin 80 mg with target LDL less than 70.  An echo Doppler study in February 20, 2021 showed normal LV function with EF 55 to 60%.  There was mild mitral gravitation, mild to moderate aortic valve sclerosis without stenosis.    Presently, he feels well.  He is not having any exertional chest pain.  He continues to note a very rare episodic sharp chest pain which immediately resolves.  He continues to climb up and down trees.  He denies presyncope or syncope.   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   Tobacco abuse     Past Surgical History:  Procedure Laterality Date   APPENDECTOMY     CARDIAC CATHETERIZATION N/A 10/04/2015   Procedure: Left Heart Cath and Coronary Angiography;  Surgeon: Lennette Bihari, MD;  Location: [facility] INVASIVE CV LAB;  Service: Cardiovascular;  Laterality: N/A;   CARDIAC CATHETERIZATION N/A 10/04/2015   Procedure: Coronary Stent Intervention;  Surgeon: Lennette Bihari, MD;  Location: [facility] INVASIVE CV LAB;  Service: Cardiovascular;  Laterality: N/A;   COLONOSCOPY  12/23/2019   THROAT SURGERY  2005   [place]    No Known Allergies  Current Outpatient Medications  Medication Sig Dispense Refill   aspirin EC 81 MG EC tablet Take 1 tablet (81 mg total) by mouth daily.     atorvastatin (LIPITOR) 80 MG tablet TAKE 1 TABLET (80  MG TOTAL) BY MOUTH DAILY AT 6 PM. 90 tablet 3   carvedilol (COREG) 3.125 MG tablet Take 1 tablet (3.125 mg total) by mouth 2 (two) times daily with a meal. 180 tablet 3   clopidogrel (PLAVIX) 75 MG tablet Take 1 tablet (75 mg total) by mouth daily. 90 tablet 2   ezetimibe (ZETIA) 10 MG tablet Take 1 tablet (10 mg total) by mouth daily. 90 tablet 3   meclizine (ANTIVERT) 25 MG tablet Take 2 tablets (50 mg total) by mouth 3 (three) times daily as needed for dizziness. 30 tablet 2   Soft Lens Products (STERILE SALINE) SOLN Use saily to flush trach after use of Inhaler (Breo) 100 mL 10   nitroGLYCERIN (NITROSTAT) 0.4 MG SL tablet Place 1 tablet (0.4 mg total) under the tongue every 5 (five) minutes as needed for chest pain (CP or SOB). (Patient not taking: Reported on 12/31/2023) 25 tablet 10   No current facility-administered medications for this visit.    Social History   Socioeconomic History   Marital status: Married    Spouse name: Not on file   Number of children: Not on file   Years of education: Not on file   Highest education level: Not on file  Occupational History   Occupation: retired  Tobacco Use   Smoking  status: Former    Current packs/day: 0.00    Average packs/day: 1.5 packs/day for 40.0 years (60.0 ttl pk-yrs)    Types: Cigarettes    Start date: 08/20/1963    Quit date: 08/20/2003    Years since quitting: 20.3   Smokeless tobacco: Never  Vaping Use   Vaping status: Never Used  Substance and Sexual Activity   Alcohol use: No    Alcohol/week: 0.0 standard drinks of alcohol   Drug use: No   Sexual activity: Not Currently  Other Topics Concern   Not on file  Social History Narrative   Not on file   Social Drivers of Health   Financial Resource Strain: Not on file  Food Insecurity: Not on file  Transportation Needs: Not on file  Physical Activity: Not on file  Stress: Not on file  Social Connections: Not on file  Intimate Partner Violence: Not on file   Socially he is married.  He quit smoking 19 years ago.  Family History  Problem Relation Age of Onset   Cancer - Other Mother    Breast cancer Mother    Brain cancer Father    CAD Brother    Colon polyps Brother    Colon cancer Neg Hx    Esophageal cancer Neg Hx    Rectal cancer Neg Hx    Stomach cancer Neg Hx     ROS General: Negative; No fevers, chills, or night sweats HEENT: Negative; No changes in vision or hearing, sinus congestion, difficulty swallowing Pulmonary: Positive for shortness of breath Cardiovascular: see HPI GI: Negative; No nausea, vomiting, diarrhea, or abdominal pain GU: Negative; No dysuria, hematuria, or difficulty voiding Musculoskeletal: Positive for occasional low back pain, right hip pain and sciatica discomfort. Hematologic/Oncology: History of throat cancer of left vocal cord, status post laryngectomy in 2005 Endocrine: Negative; no heat/cold intolerance; no diabetes, Neuro: Negative; no changes in balance, headaches Skin: Negative; No rashes or skin lesions Psychiatric: Negative; No behavioral problems, depression Sleep: Negative; No snoring,  daytime sleepiness, hypersomnolence,  bruxism, restless legs, hypnogognic hallucinations. Other comprehensive 14 point system review is negative   Physical Exam BP 120/80 (BP Location: Left  Arm, Patient Position: Sitting, Cuff Size: Normal)   Pulse (!) 58   Ht 5\' 11"  (1.803 m)   Wt 158 lb (71.7 kg)   SpO2 98%   BMI 22.04 kg/m    Repeat blood pressure by me was 112/70  Wt Readings from Last 3 Encounters:  12/31/23 158 lb (71.7 kg)  09/27/23 157 lb (71.2 kg)  08/10/23 158 lb 8.2 oz (71.9 kg)   General: Alert, oriented, no distress.  Skin: normal turgor, no rashes, warm and dry HEENT: Normocephalic, atraumatic. Pupils equal round and reactive to light; sclera anicteric; extraocular muscles intact;  Nose without nasal septal hypertrophy Mouth/Parynx benign; Mallinpatti scale Neck: Status post laryngectomy; no JVD Lungs: clear to ausculatation and percussion; no wheezing or rales Chest wall: without tenderness to palpitation Heart: PMI not displaced, RRR, s1 s2 normal, 1/6 systolic murmur, no diastolic murmur, no rubs, gallops, thrills, or heaves Abdomen: soft, nontender; no hepatosplenomehaly, BS+; abdominal aorta nontender and not dilated by palpation. Back: no CVA tenderness Pulses 2+ Musculoskeletal: full range of motion, normal strength, no joint deformities Extremities: no clubbing cyanosis or edema, Homan's sign negative  Neurologic: grossly nonfocal; Cranial nerves grossly wnl Psychologic: Normal mood and affect  EKG Interpretation Date/Time:  Tuesday December 31 2023 09:15:18 EST Ventricular Rate:  58 PR Interval:  134 QRS Duration:  82 QT Interval:  428 QTC Calculation: 420 R Axis:   -27  Text Interpretation: Sinus bradycardia ST & T wave abnormality, consider lateral ischemia When compared with ECG of 10-Aug-2023 10:24, QRS axis Shifted left Confirmed by Cory Sloan ([id number]) on 12/31/2023 9:42:17 AM    May 23, 2021 ECG (independently read by me): NSR at 63; Mild inferolateral T changes  February 08, 2021 ECG (independently read by me): Sinus bradycardia 56 bpm, left axis deviation.  Previously noted anterolateral T wave abnormalities not significantly changed from prior tracings.  Normal intervals  October 2021 ECG (independently read by me): NSR at 60; mild ST changes inferolaerally  January 03, 2017 ECG (independently read by me): Normal sinus rhythm at 62 bpm.  Left anterior hemiblock.  Inferior Q waves concordant with his prior MI with preserved R waves in lead 2.  Previously noted inferolateral T wave abnormalities.    August 2017 ECG (independently read by me): Sinus bradycardia 57 bpm.  Inferior infarct with Q waves and ST changes.  Left anterior hemiblock.  April 2017 ECG (independently read by me): Normal sinus rhythm at 72 bpm.  T-wave abnormality infererolaterally.  February 2017 ECG (independently read by me): Normal sinus rhythm.  Left anterior hemiblock.  Inferior Q waves with ST- T-wave abnormalities inferolaterally  LABS:     Latest Ref Rng & Units 08/10/2023   10:27 AM 08/08/2023   10:44 AM 02/05/2023   10:34 AM  BMP  Glucose 70 - 99 mg/dL 604  89  90   BUN 8 - 23 mg/dL 12  16  9    Creatinine 0.61 - 1.24 mg/dL 5.40  9.81  1.91   BUN/Creat Ratio 10 - 24  16  10    Sodium 135 - 145 mmol/L 138  142  141   Potassium 3.5 - 5.1 mmol/L 4.2  4.2  4.7   Chloride 98 - 111 mmol/L 104  103  103   CO2 22 - 32 mmol/L 26  25  24    Calcium 8.9 - 10.3 mg/dL 8.8  9.2  9.4         Latest Ref Rng & Units 08/08/2023  10:44 AM 02/05/2023   10:34 AM 02/08/2022    4:16 PM  Hepatic Function  Total Protein 6.0 - 8.5 g/dL 6.6  6.4  6.7   Albumin 3.8 - 4.8 g/dL 4.2  4.3  4.2   AST 0 - 40 IU/L 24  20  23    ALT 0 - 44 IU/L 16  15  17    Alk Phosphatase 44 - 121 IU/L 79  81  87   Total Bilirubin 0.0 - 1.2 mg/dL 1.1  0.9  0.6        Latest Ref Rng & Units 08/10/2023   10:27 AM 08/08/2023   10:44 AM 02/05/2023   10:34 AM  CBC  WBC 4.0 - 10.5 K/uL 5.5  7.2  7.3   Hemoglobin 13.0 -  17.0 g/dL 16.1  09.6  04.5   Hematocrit 39.0 - 52.0 % 41.0  42.1  41.8   Platelets 150 - 400 K/uL 199  211  215    Lab Results  Component Value Date   MCV 94.9 08/10/2023   MCV 93 08/08/2023   MCV 91 02/05/2023    Lab Results  Component Value Date   TSH 4.060 03/10/2018    BNP No results found for: "BNP"  ProBNP    Component Value Date/Time   PROBNP 134.4 (H) 04/03/2013 2327     Lipid Panel     Component Value Date/Time   CHOL 138 08/08/2023 1044   TRIG 118 08/08/2023 1044   HDL 50 08/08/2023 1044   CHOLHDL 2.8 08/08/2023 1044   CHOLHDL 2.6 01/04/2017 0851   VLDL 17 01/04/2017 0851   LDLCALC 67 08/08/2023 1044     RADIOLOGY: No results found.  IMPRESSION:  1. Coronary artery disease involving native coronary artery of native heart without angina pectoris   2. Hyperlipidemia LDL goal <70   3. Chronic obstructive pulmonary disease, unspecified COPD type [facility])     ASSESSMENT AND PLAN: Mr. Bunton is a 79 year old WM who is suffered an acute inferior ST segment elevation myocardial infarction secondary to total proximal occlusion of a very large dominant RCA in November 2016.  Subsequent ejection fraction has improved to 50-55% on echocardiography with basal inferior akinesis. He has a history of COPD with emphysematous changes noted on CT scan which may be contributory to some of his shortness of breath.  He continues to be on DAPT and is on Plavix and in the past there was concern of dyspnea contributing by Brilinta.  His last nuclear scan was low risk without evidence for ischemia.  When I saw him in October 2021 after not having seen him since 2018, he was fairly stable from a cardiac standpoint.  At his last evaluation in March 2022 he had developed very sharp instantaneous chest pain with immediate relief but seem to recur.  I recommended he have a chest x-ray which does not appear that this was done.  His most recent echo Doppler study in April 2022 was reviewed  with him in detail.  This continues to show normal LV function with EF 55 to 60%.  There was mild mitral vegetation.  There was evidence for mild to moderate aortic valve sclerosis without stenosis.  Pericardium appeared normal.  When I last saw him I added Zetia 10 mg to his atorvastatin 80 mg to achieve a target LDL less than 70 since his LDL in February was 82 on atorvastatin alone.  He will be seeing his primary physician next month and  will be at checking complete laboratory.  He continues to be on DAPT with aspirin/Plavix.  Blood pressure today and heart rate are stable on carvedilol low-dose 3.125 mg twice a day.  He will continue current regimen.  I will see him in 6 months for reevaluation.    Lennette Bihari, MD, [facility]  12/31/2023 9:41 AM

## 2023-12-31 NOTE — Patient Instructions (Signed)
Medication Instructions:  No medication changes were made during today's visit  *If you need a refill on your cardiac medications before your next appointment, please call your pharmacy*   Lab Work: No labs were ordered during today's visit.  If you have labs (blood work) drawn today and your tests are completely normal, you will receive your results only by: MyChart Message (if you have MyChart) OR A paper copy in the mail If you have any lab test that is abnormal or we need to change your treatment, we will call you to review the results.   Testing/Procedures: No procedures ordered today.    Follow-Up: At [facility], you and your health needs are our priority.  As part of our continuing mission to provide you with exceptional heart care, we have created designated Provider Care Teams.  These Care Teams include your primary Cardiologist (physician) and Advanced Practice Providers (APPs -  Physician Assistants and Nurse Practitioners) who all work together to provide you with the care you need, when you need it.  We recommend signing up for the patient portal called "MyChart".  Sign up information is provided on this After Visit Summary.  MyChart is used to connect with patients for Virtual Visits (Telemedicine).  Patients are able to view lab/test results, encounter notes, upcoming appointments, etc.  Non-urgent messages can be sent to your provider as well.   To learn more about what you can do with MyChart, go to [email or web address].    Your next appointment:   1 year(s)  Provider:   Dr. Bryan Lemma    Other Instructions Thank you for choosing [facility]!

## 2024-01-02 ENCOUNTER — Encounter: Payer: Self-pay | Admitting: Cardiovascular Disease

## 2024-01-03 ENCOUNTER — Ambulatory Visit: Payer: Self-pay | Admitting: Family Medicine

## 2024-01-03 ENCOUNTER — Telehealth: Payer: Medicare HMO

## 2024-01-03 NOTE — Telephone Encounter (Signed)
Chief Complaint: Vomiting/diarrhea Symptoms: Diarrhea/vomiting Frequency: off and on Pertinent Negatives: Patient denies fever, abdominal pain, dehydration, dizziness  Disposition: [x] ED /[] Urgent Care (no appt availability in office) / [] Appointment(In office/virtual)/ []  [facility]/ [] Home Care/ [] Refused Recommended Disposition /[] [facility]/ []  Follow-up with PCP Additional Notes: Patient's wife (DPR) called regarding patient having symptoms of diarrhea and vomiting since last night. Patient's wife states he has vomited a couple times last night along with diarrhea. Patient has had no vomiting today but has had three events of diarrhea today. Patient's wife states that patient doesn't look dehydrated, is drinking water without emesis, but states patient hasn't eaten yet since yesterday. Cause of symptoms unknown. Patient's wife denies blood in stool or vomit, and states that diarrhea amount is "a lot." Patient does not have fever, dizziness, abdominal pain. Patient advised by this RN to have patient go to ER per protocol, to which patient's wife was agreeable. Patient's wife notified note will be routed to office for PCP review. Patient's wife verbalized understanding. Copied from CRM [id number]. Topic: Clinical - Red Word Triage >> Jan 03, 2024 10:02 AM Eunice Blase wrote: Red Word that prompted transfer to Nurse Triage: Pt's wife called stated pt is vomiting and diarrhea, dizzy. Reason for Disposition  [1] MODERATE vomiting (e.g., 3 - 5 times/day) AND [2] age > 60 years  Answer Assessment - Initial Assessment Questions 1. VOMITING SEVERITY: "How many times have you vomited in the past 24 hours?"     - MILD:  1 - 2 times/day    - MODERATE: 3 - 5 times/day, decreased oral intake without significant weight loss or symptoms of dehydration    - SEVERE: 6 or more times/day, vomits everything or nearly everything, with significant weight loss, symptoms of dehydration      "He  threw up a about 3 times last night, but he hasn't thrown up yet today." 2. ONSET: "When did the vomiting begin?"      Last night 3. FLUIDS: "What fluids or food have you vomited up today?" "Have you been able to keep any fluids down?"     "He's been sipping some water and he's been keeping it down but he hasn't eaten yet." 4. ABDOMEN PAIN: "Are your having any abdomen pain?" If Yes : "How bad is it and what does it feel like?" (e.g., crampy, dull, intermittent, constant)      Denies 5. DIARRHEA: "Is there any diarrhea?" If Yes, ask: "How many times today?"      He's gone three times today. 6. CONTACTS: "Is there anyone else in the family with the same symptoms?"      "He's been around people that's got the flu, my son's girlfriend  7. CAUSE: "What do you think is causing your vomiting?"     "Unknown" 8. HYDRATION STATUS: "Any signs of dehydration?" (e.g., dry mouth [not only dry lips], too weak to stand) "When did you last urinate?"     "He's drinking now, but not at much. I told him that he's got to get something in his system." 9. OTHER SYMPTOMS: "Do you have any other symptoms?" (e.g., fever, headache, vertigo, vomiting blood or coffee grounds, recent head injury)     Denies  Protocols used: Vomiting-A-AH

## 2024-01-27 ENCOUNTER — Other Ambulatory Visit: Payer: Self-pay

## 2024-01-27 ENCOUNTER — Encounter (HOSPITAL_COMMUNITY): Payer: Self-pay

## 2024-01-27 ENCOUNTER — Emergency Department (HOSPITAL_COMMUNITY)
Admission: EM | Admit: 2024-01-27 | Discharge: 2024-01-28 | Disposition: A | Discharge status: Home / Self Care | Attending: Emergency Medicine | Admitting: Emergency Medicine

## 2024-01-27 ENCOUNTER — Emergency Department (HOSPITAL_COMMUNITY)

## 2024-01-27 DIAGNOSIS — N400 Enlarged prostate without lower urinary tract symptoms: Secondary | ICD-10-CM | POA: Diagnosis not present

## 2024-01-27 DIAGNOSIS — Z7902 Long term (current) use of antithrombotics/antiplatelets: Secondary | ICD-10-CM | POA: Diagnosis not present

## 2024-01-27 DIAGNOSIS — Z7982 Long term (current) use of aspirin: Secondary | ICD-10-CM | POA: Insufficient documentation

## 2024-01-27 DIAGNOSIS — R079 Chest pain, unspecified: Secondary | ICD-10-CM | POA: Diagnosis not present

## 2024-01-27 DIAGNOSIS — R531 Weakness: Secondary | ICD-10-CM | POA: Insufficient documentation

## 2024-01-27 DIAGNOSIS — I7789 Other specified disorders of arteries and arterioles: Secondary | ICD-10-CM | POA: Insufficient documentation

## 2024-01-27 DIAGNOSIS — R0602 Shortness of breath: Secondary | ICD-10-CM | POA: Diagnosis not present

## 2024-01-27 DIAGNOSIS — R0789 Other chest pain: Secondary | ICD-10-CM | POA: Insufficient documentation

## 2024-01-27 DIAGNOSIS — I719 Aortic aneurysm of unspecified site, without rupture: Secondary | ICD-10-CM

## 2024-01-27 DIAGNOSIS — I6782 Cerebral ischemia: Secondary | ICD-10-CM | POA: Diagnosis not present

## 2024-01-27 DIAGNOSIS — J439 Emphysema, unspecified: Secondary | ICD-10-CM | POA: Diagnosis not present

## 2024-01-27 DIAGNOSIS — K5730 Diverticulosis of large intestine without perforation or abscess without bleeding: Secondary | ICD-10-CM | POA: Diagnosis not present

## 2024-01-27 LAB — BASIC METABOLIC PANEL
Anion gap: 10 (ref 5–15)
BUN: 17 mg/dL (ref 8–23)
CO2: 23 mmol/L (ref 22–32)
Calcium: 8.9 mg/dL (ref 8.9–10.3)
Chloride: 105 mmol/L (ref 98–111)
Creatinine, Ser: 0.94 mg/dL (ref 0.61–1.24)
GFR, Estimated: >60 mL/min (ref >60)
Glucose, Bld: 92 mg/dL (ref 70–99)
Potassium: 3.6 mmol/L (ref 3.5–5.1)
Sodium: 138 mmol/L (ref 135–145)

## 2024-01-27 LAB — CBC
HCT: 41.1 % (ref 39.0–52.0)
Hemoglobin: 13.6 g/dL (ref 13.0–17.0)
MCH: 30.2 pg (ref 26.0–34.0)
MCHC: 33.1 g/dL (ref 30.0–36.0)
MCV: 91.3 fL (ref 80.0–100.0)
Platelets: 225 10*3/uL (ref 150–400)
RBC: 4.5 MIL/uL (ref 4.22–5.81)
RDW: 13.1 % (ref 11.5–15.5)
WBC: 5 10*3/uL (ref 4.0–10.5)
nRBC: 0 % (ref 0.0–0.2)

## 2024-01-27 LAB — TROPONIN I (HIGH SENSITIVITY)
Troponin I (High Sensitivity): 9 ng/L (ref <18)
Troponin I (High Sensitivity): 9 ng/L (ref <18)

## 2024-01-27 MED ORDER — IOHEXOL 350 MG/ML SOLN
100.0000 mL | Freq: Once | INTRAVENOUS | Status: AC | PRN
  Administered 2024-01-27: 100 mL via INTRAVENOUS

## 2024-01-27 MED ORDER — LACTATED RINGERS IV BOLUS
1000.0000 mL | Freq: Once | INTRAVENOUS | Status: AC
  Administered 2024-01-27: 1000 mL via INTRAVENOUS

## 2024-01-27 NOTE — Discharge Instructions (Addendum)
 Your testing today did not show any evidence of a heart attack.  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  We are referring you to vascular surgery.  Follow-up with your primary care physician.  If you develop new or worsening chest pain, severe pain to your chest, abdomen, or back, or any other new/concerning symptoms then return to the ER or call 911.

## 2024-01-27 NOTE — Progress Notes (Signed)
 Went in to check patient.  Patient's stoma is intact with no redness or irritation.  Patient did have a slight wheeze in upper lobes, diminished in lower, but patient stated he usually does not have any issues coughing up secretions.  Patient is on RA with sat around 94%.  Radiology came into room to go and do patient's scan.

## 2024-01-27 NOTE — ED Triage Notes (Signed)
 Pt arrived via POV c/o worsening chest pain X 4 days. Pt presents SOB, tachy and reports he has been falling lot more frequently this past week. Pt reports pain radiates to left arm.

## 2024-01-27 NOTE — ED Provider Notes (Signed)
 [facility] AT [facility] Provider Note   CSN: [id number] Arrival date & time: 01/27/24  1756     History  Chief Complaint  Patient presents with   Chest Pain    Cory Sloan is a 78 y.o. male.  HPI 78 year old male presents with weakness and falls as well as chest pain.  These are been going on for the last few weeks.  He will be walking and randomly seem to fall.  He states his legs seem to give out on him and he feels like he gets weak all over.  He denies any focal weakness but he does feel dizzy and lightheaded at times.  He denies any headaches or significant trauma.  He is currently being separated from his wife and so he is also under a lot of stress which she thinks is causing chest pain.  This is sharp chest pain that is coming and going.  He denies any cough or fever or new or worsening back/neck pain.  No retention or incontinence or abdominal pain.  Home Medications Prior to Admission medications   Medication Sig Start Date End Date Taking? Authorizing Provider  aspirin EC 81 MG EC tablet Take 1 tablet (81 mg total) by mouth daily. 10/07/15   Bhagat, Sharrell Ku, PA  atorvastatin (LIPITOR) 80 MG tablet TAKE 1 TABLET (80 MG TOTAL) BY MOUTH DAILY AT 6 PM. 02/05/23   Mechele Claude, MD  carvedilol (COREG) 3.125 MG tablet Take 1 tablet (3.125 mg total) by mouth 2 (two) times daily with a meal. 08/08/23   Mechele Claude, MD  clopidogrel (PLAVIX) 75 MG tablet Take 1 tablet (75 mg total) by mouth daily. 08/08/23   Mechele Claude, MD  ezetimibe (ZETIA) 10 MG tablet Take 1 tablet (10 mg total) by mouth daily. 08/08/23 12/31/23  Mechele Claude, MD  meclizine (ANTIVERT) 25 MG tablet Take 2 tablets (50 mg total) by mouth 3 (three) times daily as needed for dizziness. 08/11/23   Mechele Claude, MD  nitroGLYCERIN (NITROSTAT) 0.4 MG SL tablet Place 1 tablet (0.4 mg total) under the tongue every 5 (five) minutes as needed for chest pain (CP or SOB). Patient  not taking: Reported on 12/31/2023 08/07/22   Mechele Claude, MD  Soft Lens Products (STERILE SALINE) SOLN Use saily to flush trach after use of Inhaler Virgel Bouquet) 12/16/18   Mechele Claude, MD      Allergies    Patient has no known allergies.    Review of Systems   Review of Systems  Constitutional:  Negative for fever.  Respiratory:  Positive for shortness of breath. Negative for cough.   Cardiovascular:  Positive for chest pain.  Gastrointestinal:  Negative for abdominal pain.  Neurological:  Positive for dizziness, weakness and light-headedness. Negative for syncope and headaches.    Physical Exam Updated Vital Signs BP 135/89   Pulse 63   Temp 98 F (36.7 C) (Temporal)   Resp 12   Ht 5\' 11"  (1.803 m)   Wt 71.7 kg   SpO2 93%   BMI 22.05 kg/m  Physical Exam Vitals and nursing note reviewed.  Constitutional:      Appearance: He is well-developed.  HENT:     Head: Normocephalic and atraumatic.  Eyes:     Extraocular Movements: Extraocular movements intact.  Cardiovascular:     Rate and Rhythm: Normal rate and regular rhythm.     Heart sounds: Normal heart sounds.  Pulmonary:     Effort: Pulmonary effort is  normal.     Breath sounds: Normal breath sounds.  Abdominal:     Palpations: Abdomen is soft.     Tenderness: There is no abdominal tenderness.  Skin:    General: Skin is warm and dry.  Neurological:     Mental Status: He is alert.     Comments: CN 3-12 grossly intact. 5/5 strength in all 4 extremities. Grossly normal sensation. Normal finger to nose.      ED Results / Procedures / Treatments   Labs (all labs ordered are listed, but only abnormal results are displayed) Labs Reviewed  BASIC METABOLIC PANEL  CBC  TROPONIN I (HIGH SENSITIVITY)  TROPONIN I (HIGH SENSITIVITY)    EKG EKG Interpretation Date/Time:  Monday January 27 2024 18:08:49 EDT Ventricular Rate:  102 PR Interval:  128 QRS Duration:  82 QT Interval:  338 QTC Calculation: 440 R  Axis:   -61  Text Interpretation: Sinus tachycardia Left anterior fascicular block Cannot rule out Anterior infarct , age undetermined ST & T wave abnormality, consider lateral ischemia Abnormal ECG  HR is faster, otherwise ST changes similar to Feb  2025 Confirmed by Pricilla Loveless [phone]) on 01/27/2024 6:18:19 PM  Radiology CT Angio Chest/Abd/Pel for Dissection W and/or Wo Contrast Result Date: 01/27/2024 CLINICAL DATA:  Acute aortic syndrome, chest pain EXAM: CT ANGIOGRAPHY CHEST, ABDOMEN AND PELVIS TECHNIQUE: Non-contrast CT of the chest was initially obtained. Multidetector CT imaging through the chest, abdomen and pelvis was performed using the standard protocol during bolus administration of intravenous contrast. Multiplanar reconstructed images and MIPs were obtained and reviewed to evaluate the vascular anatomy. RADIATION DOSE REDUCTION: This exam was performed according to the departmental dose-optimization program which includes automated exposure control, adjustment of the mA and/or kV according to patient size and/or use of iterative reconstruction technique. CONTRAST:  OMNIPAQUE IOHEXOL 350 MG/ML SOLN COMPARISON:  08/10/2023 FINDINGS: CTA CHEST FINDINGS Cardiovascular: Preferential opacification of the thoracic aorta. 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 Descending thoracic aorta is of normal caliber. 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 Small associated peri-aortic hematoma identified best seen on image # 84/6. Mild superimposed atherosclerotic calcification. Arch vasculature demonstrates classic anatomic configuration and is widely patent proximally. Multi-vessel coronary artery calcification, extensive within the right coronary artery. Global cardiac size within normal limits.  No pericardial effusion. Central pulmonary arteries are of normal caliber. Mediastinum/Nodes: Relatively large mature tracheostomy defect seen at the thoracic inlet. Visualized thyroid is unremarkable. No pathologic thoracic adenopathy. Esophagus is unremarkable. Lungs/Pleura: Severe emphysema. No superimposed focal pulmonary infiltrate. Fused bronchial wall thickening present in keeping with airway inflammation. No pneumothorax or pleural effusion. No central obstructing mass. Musculoskeletal: No chest wall abnormality. No acute or significant osseous findings. Review of the MIP images confirms the above findings. CTA ABDOMEN AND PELVIS FINDINGS VASCULAR Aorta: Normal caliber. No aneurysm or dissection. Small ulcer like projection noted within moderate atheromatous infrarenal aortic plaque best seen on image # 135/6. No intramural hematoma or dissection. Celiac: Patent without evidence of aneurysm, dissection, vasculitis or significant stenosis. SMA: Patent without evidence of aneurysm, dissection, vasculitis or significant stenosis. Renals: Dual right and 3 left renal arteries are patent without evidence of aneurysm, dissection, vasculitis, fibromuscular dysplasia or significant stenosis. IMA: Patent without evidence of aneurysm, dissection, vasculitis or significant  stenosis. Inflow: Patent without evidence of aneurysm, dissection, vasculitis or significant stenosis. Veins: No obvious venous abnormality within the limitations of this arterial phase study. Review of the MIP images confirms the above findings. NON-VASCULAR Hepatobiliary: No focal liver abnormality is seen. Status post cholecystectomy. No biliary dilatation. Pancreas: Unremarkable Spleen: Unremarkable Adrenals/Urinary Tract: Adrenal glands are unremarkable. Kidneys are normal, without renal calculi, focal lesion, or hydronephrosis. Bladder is unremarkable. Stomach/Bowel: Severe sigmoid diverticulosis. Stomach, small bowel, and large bowel are  otherwise unremarkable. Appendix not visualized and likely absent. No evidence of obstruction or focal inflammation. No free intraperitoneal gas or fluid. Lymphatic: No pathologic adenopathy within the abdomen and pelvis Reproductive: Moderate prostatic hypertrophy Other: No abdominal wall hernia Musculoskeletal: No acute bone abnormality. No lytic or blastic bone lesion. Review of the MIP images confirms the above findings. IMPRESSION: 1. No acute intrathoracic or intra-abdominal pathology identified. 2. 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 Recommend annual imaging followup by CTA or MRA. This recommendation follows 2010 ACCF/AHA/AATS/ACR/ASA/SCA/SCAI/SIR/STS/SVM Guidelines for the Diagnosis and Management of Patients with Thoracic Aortic Disease. Circulation. 2010; 121: Z610-R604. Aortic aneurysm NOS (ICD10-I71.9) 3. 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 Small associated periaortic hematoma. This can account for the patient's reported chest pain and vascular consultation may be helpful for further evaluation. 4. Multi-vessel coronary artery calcification, extensive within the right coronary artery. 5. Severe emphysema 6. Severe sigmoid diverticulosis. 7. Moderate prostatic hypertrophy. Aortic Atherosclerosis (ICD10-I70.0) and Emphysema (ICD10-J43.9). Electronically Signed   By: Helyn Numbers M.D.   On: 01/27/2024 23:13   CT Head Wo Contrast Result Date: 01/27/2024 CLINICAL DATA:  Acute stroke suspected. EXAM: CT HEAD WITHOUT CONTRAST TECHNIQUE: Contiguous axial images were obtained from the base of the skull through the vertex without intravenous contrast. RADIATION DOSE REDUCTION: This exam was performed according to the departmental dose-optimization program which includes automated exposure control, adjustment of the mA and/or kV  according to patient size and/or use of iterative reconstruction technique. COMPARISON:  None Available. FINDINGS: Brain: No evidence of acute infarction, hemorrhage, hydrocephalus, extra-axial collection or mass lesion/mass effect. There is moderate patchy periventricular and deep white matter hypodensity. Vascular: No hyperdense vessel or unexpected calcification. Skull: Normal. Negative for fracture or focal lesion. Sinuses/Orbits: No acute finding. Other: None. IMPRESSION: 1. No acute intracranial process. 2. Moderate chronic small vessel ischemic changes. Electronically Signed   By: Darliss Cheney M.D.   On: 01/27/2024 23:00   DG Chest 2 View Result Date: 01/27/2024 CLINICAL DATA:  Shortness of breath and chest pain EXAM: CHEST - 2 VIEW COMPARISON:  08/10/2023 FINDINGS: The heart size and mediastinal contours are within normal limits. Both lungs are clear. The visualized skeletal structures are unremarkable. IMPRESSION: No active cardiopulmonary disease. Electronically Signed   By: Jasmine Pang M.D.   On: 01/27/2024 21:02    Procedures Procedures    Medications Ordered in ED Medications  lactated ringers bolus 1,000 mL (0 mLs Intravenous Stopped 01/27/24 2040)  iohexol (OMNIPAQUE) 350 MG/ML injection 100 mL (100 mLs Intravenous Contrast Given 01/27/24 2024)    ED Course/ Medical Decision Making/ A&P                                 Medical Decision Making Amount and/or Complexity of Data Reviewed Labs: ordered.    Details: Normal troponin  x 2 Radiology: ordered and independent interpretation performed.    Details: No aortic dissection ECG/medicine tests: ordered and independent interpretation performed.    Details: No ischemia  Risk Prescription drug management.   Patient's chest pain is atypical and has been ongoing on and off for quite some time.  Due to his known thoracic aortic aneurysm a CTA was obtained which does not show dissection but does show this penetrating ulcer.  I  discussed with Dr. Chestine Spore, given the intermittent symptoms and no severe, boring type pain this is less likely to be symptomatic.  In his interpretation this is similar to last years CT and does not appear acute.  He will help set up patient for outpatient appointment.  Otherwise I have low suspicion of ACS.  He has no neurodeficits on exam to explain his falls.  CT head is unremarkable.  Ultimately patient is currently asymptomatic and feels well enough for discharge.  Will give return precautions.        Final Clinical Impression(s) / ED Diagnoses Final diagnoses:  Nonspecific chest pain  Penetrating ulcer of aorta (HCC)    Rx / DC Orders ED Discharge Orders     None         Pricilla Loveless, MD 01/27/24 2356

## 2024-01-28 ENCOUNTER — Telehealth: Payer: Self-pay | Admitting: Vascular Surgery

## 2024-02-05 ENCOUNTER — Ambulatory Visit: Payer: Medicare HMO | Admitting: Family Medicine

## 2024-02-06 ENCOUNTER — Encounter: Payer: Self-pay | Admitting: Family Medicine

## 2024-02-06 ENCOUNTER — Ambulatory Visit (INDEPENDENT_AMBULATORY_CARE_PROVIDER_SITE_OTHER): Admitting: Family Medicine

## 2024-02-06 VITALS — BP 111/70 | HR 70 | Temp 98.0°F | Ht 71.0 in | Wt 150.0 lb

## 2024-02-06 DIAGNOSIS — J441 Chronic obstructive pulmonary disease with (acute) exacerbation: Secondary | ICD-10-CM | POA: Diagnosis not present

## 2024-02-06 DIAGNOSIS — I6523 Occlusion and stenosis of bilateral carotid arteries: Secondary | ICD-10-CM | POA: Diagnosis not present

## 2024-02-06 DIAGNOSIS — R296 Repeated falls: Secondary | ICD-10-CM

## 2024-02-06 DIAGNOSIS — I7123 Aneurysm of the descending thoracic aorta, without rupture: Secondary | ICD-10-CM | POA: Diagnosis not present

## 2024-02-06 DIAGNOSIS — I10 Essential (primary) hypertension: Secondary | ICD-10-CM

## 2024-02-06 DIAGNOSIS — J449 Chronic obstructive pulmonary disease, unspecified: Secondary | ICD-10-CM

## 2024-02-06 DIAGNOSIS — E785 Hyperlipidemia, unspecified: Secondary | ICD-10-CM | POA: Diagnosis not present

## 2024-02-06 MED ORDER — ATORVASTATIN CALCIUM 80 MG PO TABS: ORAL_TABLET | ORAL | 3 refills | Status: DC

## 2024-02-06 MED ORDER — PREDNISONE 10 MG PO TABS: ORAL_TABLET | ORAL | 0 refills | Status: DC

## 2024-02-06 MED ORDER — CLOPIDOGREL BISULFATE 75 MG PO TABS: 75.0000 mg | ORAL_TABLET | Freq: Every day | ORAL | 2 refills | Status: DC

## 2024-02-06 MED ORDER — MOXIFLOXACIN HCL 400 MG PO TABS: 400.0000 mg | ORAL_TABLET | Freq: Every day | ORAL | 0 refills | Status: DC

## 2024-02-06 NOTE — Progress Notes (Addendum)
 Subjective:  Patient ID: Cory Sloan, male    DOB: 1946-05-11  Age: 78 y.o. MRN: [id number]  CC: Medical Management of Chronic Issues (No concerns)   HPI Cory Sloan presents for follow-up of elevated cholesterol. Doing well without complaints on current medication. Denies side effects of statin including myalgia and arthralgia and nausea. Also in today for liver function testing. Currently no chest pain, shortness of breath or other cardiovascular related symptoms noted.   presents for  follow-up of hypertension. Patient has no history of headache chest pain or shortness of breath or recent cough. Patient also denies symptoms of TIA such as focal numbness or weakness. Patient denies side effects from medication. States taking it regularly.  Patient describes multiple falls in the last several months.  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  When he looks back down he feels swimmy headed and falls.  He has not had any injury as yet.  When he falls he does not pass out but he does feel like things are moving around.  He had a visit with his cognitive doctor last month..  He also went to the emergency room for this problem 10 days ago.  At that time he mentioned that there was chest pain involved which she did not mention today.  He stated that his legs just seem to give out on him.  At that time he did not related to looking up.  This was before his current cough started.  He received a CT angio of his chest abdomen and pelvis for dissection etc. 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  He was also noted to have coronary artery calcifications especially in the right coronary.  He had severe emphysema and severe sigmoid diverticulosis he had CT of the head that showed no acute intracranial process 10 days ago.  Patient has had a rather severe cough.  His significant other had the same  symptoms except hers developed into pneumonia.  He has been coughing up darker sputum more frequently than his usual.  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  His phlegm comes up through that. 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   He has a past surgical history that includes Appendectomy; Throat surgery (2005); Cardiac catheterization (N/A, 10/04/2015); Cardiac catheterization (N/A, 10/04/2015); and Colonoscopy (12/23/2019).   His family history includes Brain cancer in his father; Breast cancer in his mother; CAD in his brother; Cancer - Other in his mother; Colon polyps in his brother.He reports that he quit smoking about 20 years ago. His smoking use included cigarettes. He started smoking about 60 years ago. He has a 60 pack-year smoking history. He has never used smokeless tobacco. He reports that he does not drink alcohol and does not use drugs.  Current Outpatient Medications on File Prior to Visit  Medication Sig Dispense Refill   aspirin EC 81 MG EC tablet Take 1 tablet (81 mg total) by mouth daily.     carvedilol (COREG) 3.125 MG tablet Take 1 tablet (3.125 mg total) by mouth 2 (two) times daily with a meal. 180 tablet 3  ezetimibe (ZETIA) 10 MG tablet Take 1 tablet (10 mg total) by mouth daily. 90 tablet 3   meclizine (ANTIVERT) 25 MG tablet Take 2 tablets (50 mg total) by mouth 3 (three) times daily as needed for dizziness. 30 tablet 2   nitroGLYCERIN (NITROSTAT) 0.4 MG SL tablet Place 1 tablet (0.4 mg total) under the tongue every 5 (five) minutes as needed for chest pain (CP or SOB). (Patient not taking: Reported on 12/31/2023) 25 tablet 10   Soft Lens Products  (STERILE SALINE) SOLN Use saily to flush trach after use of Inhaler (Breo) 100 mL 10   No current facility-administered medications on file prior to visit.    ROS Review of Systems  Constitutional:  Negative for fever.  Respiratory:  Negative for shortness of breath.   Cardiovascular:  Negative for chest pain.  Musculoskeletal:  Negative for arthralgias.  Skin:  Negative for rash.    Objective:  BP 111/70   Pulse 70   Temp 98 F (36.7 C)   Ht 5\' 11"  (1.803 m)   Wt 150 lb (68 kg)   SpO2 96%   BMI 20.92 kg/m   BP Readings from Last 3 Encounters:  02/06/24 111/70  01/27/24 135/89  12/31/23 120/80    Wt Readings from Last 3 Encounters:  02/06/24 150 lb (68 kg)  01/27/24 158 lb 1.1 oz (71.7 kg)  12/31/23 158 lb (71.7 kg)     Physical Exam Vitals reviewed.  Constitutional:      Appearance: He is well-developed.  HENT:     Head: Normocephalic and atraumatic.     Right Ear: External ear normal.     Left Ear: External ear normal.     Mouth/Throat:     Pharynx: No oropharyngeal exudate or posterior oropharyngeal erythema.  Eyes:     Pupils: Pupils are equal, round, and reactive to light.  Cardiovascular:     Rate and Rhythm: Normal rate and regular rhythm.     Heart sounds: No murmur heard. Pulmonary:     Effort: No respiratory distress.     Breath sounds: Wheezing and rhonchi present. No rales.  Musculoskeletal:     Cervical back: Normal range of motion and neck supple.  Neurological:     Mental Status: He is alert and oriented to person, place, and time.     Lab Results  Component Value Date   HGBA1C 5.7 12/16/2018   HGBA1C 5.5 03/10/2018   HGBA1C 5.8 (H) 10/05/2015    Lab Results  Component Value Date   WBC 6.4 02/06/2024   HGB 13.2 02/06/2024   HCT 40.7 02/06/2024   PLT 227 02/06/2024   GLUCOSE 91 02/06/2024   CHOL 110 02/06/2024   TRIG 121 02/06/2024   HDL 40 02/06/2024   LDLCALC 48 02/06/2024   ALT 10 02/06/2024   AST 21 02/06/2024   NA  141 02/06/2024   K 4.1 02/06/2024   CL 104 02/06/2024   CREATININE 0.98 02/06/2024   BUN 11 02/06/2024   CO2 25 02/06/2024   TSH 4.060 03/10/2018   INR 1.02 03/10/2017   HGBA1C 5.7 12/16/2018    CT Angio Chest/Abd/Pel for Dissection W and/or Wo Contrast Result Date: 01/27/2024 CLINICAL DATA:  Acute aortic syndrome, chest pain EXAM: CT ANGIOGRAPHY CHEST, ABDOMEN AND PELVIS TECHNIQUE: Non-contrast CT of the chest was initially obtained. Multidetector CT imaging through the chest, abdomen and pelvis was performed using the standard protocol during bolus administration of intravenous contrast. Multiplanar reconstructed images and MIPs were  obtained and reviewed to evaluate the vascular anatomy. RADIATION DOSE REDUCTION: This exam was performed according to the departmental dose-optimization program which includes automated exposure control, adjustment of the mA and/or kV according to patient size and/or use of iterative reconstruction technique. CONTRAST:  OMNIPAQUE IOHEXOL 350 MG/ML SOLN COMPARISON:  08/10/2023 FINDINGS: CTA CHEST FINDINGS Cardiovascular: Preferential opacification of the thoracic aorta. 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 Descending thoracic aorta is of normal caliber. 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 Small associated peri-aortic hematoma identified best seen on image # 84/6. Mild superimposed atherosclerotic calcification. Arch vasculature demonstrates classic anatomic configuration and is widely patent proximally. Multi-vessel coronary artery calcification, extensive within the right coronary artery. Global cardiac size within normal limits. No pericardial effusion. Central pulmonary arteries are of normal caliber. Mediastinum/Nodes: Relatively large mature  tracheostomy defect seen at the thoracic inlet. Visualized thyroid is unremarkable. No pathologic thoracic adenopathy. Esophagus is unremarkable. Lungs/Pleura: Severe emphysema. No superimposed focal pulmonary infiltrate. Fused bronchial wall thickening present in keeping with airway inflammation. No pneumothorax or pleural effusion. No central obstructing mass. Musculoskeletal: No chest wall abnormality. No acute or significant osseous findings. Review of the MIP images confirms the above findings. CTA ABDOMEN AND PELVIS FINDINGS VASCULAR Aorta: Normal caliber. No aneurysm or dissection. Small ulcer like projection noted within moderate atheromatous infrarenal aortic plaque best seen on image # 135/6. No intramural hematoma or dissection. Celiac: Patent without evidence of aneurysm, dissection, vasculitis or significant stenosis. SMA: Patent without evidence of aneurysm, dissection, vasculitis or significant stenosis. Renals: Dual right and 3 left renal arteries are patent without evidence of aneurysm, dissection, vasculitis, fibromuscular dysplasia or significant stenosis. IMA: Patent without evidence of aneurysm, dissection, vasculitis or significant stenosis. Inflow: Patent without evidence of aneurysm, dissection, vasculitis or significant stenosis. Veins: No obvious venous abnormality within the limitations of this arterial phase study. Review of the MIP images confirms the above findings. NON-VASCULAR Hepatobiliary: No focal liver abnormality is seen. Status post cholecystectomy. No biliary dilatation. Pancreas: Unremarkable Spleen: Unremarkable Adrenals/Urinary Tract: Adrenal glands are unremarkable. Kidneys are normal, without renal calculi, focal lesion, or hydronephrosis. Bladder is unremarkable. Stomach/Bowel: Severe sigmoid diverticulosis. Stomach, small bowel, and large bowel are otherwise unremarkable. Appendix not visualized and likely absent. No evidence of obstruction or focal inflammation. No free  intraperitoneal gas or fluid. Lymphatic: No pathologic adenopathy within the abdomen and pelvis Reproductive: Moderate prostatic hypertrophy Other: No abdominal wall hernia Musculoskeletal: No acute bone abnormality. No lytic or blastic bone lesion. Review of the MIP images confirms the above findings. IMPRESSION: 1. No acute intrathoracic or intra-abdominal pathology identified. 2. 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 Recommend annual imaging followup by CTA or MRA. This recommendation follows 2010 ACCF/AHA/AATS/ACR/ASA/SCA/SCAI/SIR/STS/SVM Guidelines for the Diagnosis and Management of Patients with Thoracic Aortic Disease. Circulation. 2010; 121: Y865-H846. Aortic aneurysm NOS (ICD10-I71.9) 3. 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 Small associated periaortic hematoma. This can account for the patient's reported chest pain and vascular consultation may be helpful for further evaluation. 4. Multi-vessel coronary artery calcification, extensive within the right coronary artery. 5. Severe emphysema 6. Severe sigmoid diverticulosis. 7. Moderate prostatic hypertrophy. Aortic Atherosclerosis (ICD10-I70.0)  and Emphysema (ICD10-J43.9). Electronically Signed   By: Helyn Numbers M.D.   On: 01/27/2024 23:13   CT Head Wo Contrast Result Date: 01/27/2024 CLINICAL DATA:  Acute stroke suspected. EXAM: CT HEAD WITHOUT CONTRAST TECHNIQUE: Contiguous axial images were obtained from the base of the skull through the vertex without intravenous contrast. RADIATION DOSE REDUCTION: This exam was performed according to the departmental dose-optimization program which includes automated exposure control, adjustment of the mA and/or kV according to patient size and/or use of iterative reconstruction technique. COMPARISON:  None Available. FINDINGS: Brain: No  evidence of acute infarction, hemorrhage, hydrocephalus, extra-axial collection or mass lesion/mass effect. There is moderate patchy periventricular and deep white matter hypodensity. Vascular: No hyperdense vessel or unexpected calcification. Skull: Normal. Negative for fracture or focal lesion. Sinuses/Orbits: No acute finding. Other: None. IMPRESSION: 1. No acute intracranial process. 2. Moderate chronic small vessel ischemic changes. Electronically Signed   By: Darliss Cheney M.D.   On: 01/27/2024 23:00   DG Chest 2 View Result Date: 01/27/2024 CLINICAL DATA:  Shortness of breath and chest pain EXAM: CHEST - 2 VIEW COMPARISON:  08/10/2023 FINDINGS: The heart size and mediastinal contours are within normal limits. Both lungs are clear. The visualized skeletal structures are unremarkable. IMPRESSION: No active cardiopulmonary disease. Electronically Signed   By: Jasmine Pang M.D.   On: 01/27/2024 21:02    Assessment & Plan:  Essential hypertension -     CBC with Differential/Platelet -     CMP14+EGFR  Acute exacerbation of chronic obstructive pulmonary disease (COPD) (HCC) -     CBC with Differential/Platelet -     CMP14+EGFR  Falling -     CBC with Differential/Platelet -     CMP14+EGFR  Hyperlipidemia LDL goal <70 -     CBC with Differential/Platelet -     CMP14+EGFR -     Lipid panel  Aneurysm of descending thoracic aorta without rupture (HCC) -     CBC with Differential/Platelet -     CMP14+EGFR  Bilateral carotid artery stenosis -     CBC with Differential/Platelet -     CMP14+EGFR -     US Carotid Bilateral; Future  Other orders -     Atorvastatin Calcium; TAKE 1 TABLET (80 MG TOTAL) BY MOUTH DAILY AT 6 PM.  Dispense: 90 tablet; Refill: 3 -     Clopidogrel Bisulfate; Take 1 tablet (75 mg total) by mouth daily.  Dispense: 90 tablet; Refill: 2 -     predniSONE; Take 5 daily for 2 days followed by 4,3,2 and 1 for 2 days each.  Dispense: 30 tablet; Refill: 0 -      Moxifloxacin HCl; Take 1 tablet (400 mg total) by mouth daily.  Dispense: 10 tablet; Refill: 0    I am having Cory Sloan start on predniSONE and moxifloxacin. I am also having him maintain his aspirin EC, Sterile Saline, nitroGLYCERIN, ezetimibe, carvedilol, meclizine, atorvastatin, and clopidogrel.    Follow-up: Return in about 6 weeks (around 03/19/2024).  Mechele Claude, M.D.

## 2024-02-07 ENCOUNTER — Encounter: Payer: Self-pay | Admitting: Family Medicine

## 2024-02-07 LAB — CBC WITH DIFFERENTIAL/PLATELET
Basophils Absolute: 0.1 10*3/uL (ref 0.0–0.2)
Basos: 1 %
EOS (ABSOLUTE): 0.2 10*3/uL (ref 0.0–0.4)
Eos: 4 %
Hematocrit: 40.7 % (ref 37.5–51.0)
Hemoglobin: 13.2 g/dL (ref 13.0–17.7)
Immature Grans (Abs): 0 10*3/uL (ref 0.0–0.1)
Immature Granulocytes: 0 %
Lymphocytes Absolute: 1.8 10*3/uL (ref 0.7–3.1)
Lymphs: 29 %
MCH: 29.7 pg (ref 26.6–33.0)
MCHC: 32.4 g/dL (ref 31.5–35.7)
MCV: 92 fL (ref 79–97)
Monocytes Absolute: 0.8 10*3/uL (ref 0.1–0.9)
Monocytes: 12 %
Neutrophils Absolute: 3.5 10*3/uL (ref 1.4–7.0)
Neutrophils: 54 %
Platelets: 227 10*3/uL (ref 150–450)
RBC: 4.44 x10E6/uL (ref 4.14–5.80)
RDW: 12.5 % (ref 11.6–15.4)
WBC: 6.4 10*3/uL (ref 3.4–10.8)

## 2024-02-07 LAB — CMP14+EGFR
ALT: 10 IU/L (ref 0–44)
AST: 21 IU/L (ref 0–40)
Albumin: 4 g/dL (ref 3.8–4.8)
Alkaline Phosphatase: 73 IU/L (ref 44–121)
BUN/Creatinine Ratio: 11 (ref 10–24)
BUN: 11 mg/dL (ref 8–27)
Bilirubin Total: 0.6 mg/dL (ref 0.0–1.2)
CO2: 25 mmol/L (ref 20–29)
Calcium: 8.9 mg/dL (ref 8.6–10.2)
Chloride: 104 mmol/L (ref 96–106)
Creatinine, Ser: 0.98 mg/dL (ref 0.76–1.27)
Globulin, Total: 2.3 g/dL (ref 1.5–4.5)
Glucose: 91 mg/dL (ref 70–99)
Potassium: 4.1 mmol/L (ref 3.5–5.2)
Sodium: 141 mmol/L (ref 134–144)
Total Protein: 6.3 g/dL (ref 6.0–8.5)
eGFR: 80 mL/min/{1.73_m2} (ref >59)

## 2024-02-07 LAB — LIPID PANEL
Chol/HDL Ratio: 2.8 ratio (ref 0.0–5.0)
Cholesterol, Total: 110 mg/dL (ref 100–199)
HDL: 40 mg/dL (ref >39)
LDL Chol Calc (NIH): 48 mg/dL (ref 0–99)
Triglycerides: 121 mg/dL (ref 0–149)
VLDL Cholesterol Cal: 22 mg/dL (ref 5–40)

## 2024-02-10 ENCOUNTER — Encounter: Payer: Self-pay | Admitting: Family Medicine

## 2024-02-10 NOTE — Progress Notes (Signed)
 Hello [place],  Your lab result is normal and/or stable.Some minor variations that are not significant are commonly marked abnormal, but do not represent any medical problem for you.  Best regards, Mechele Claude, M.D.

## 2024-02-25 DIAGNOSIS — Z8521 Personal history of malignant neoplasm of larynx: Secondary | ICD-10-CM | POA: Diagnosis not present

## 2024-03-12 DIAGNOSIS — Z930 Tracheostomy status: Secondary | ICD-10-CM | POA: Diagnosis not present

## 2024-03-23 ENCOUNTER — Ambulatory Visit: Admitting: Family Medicine

## 2024-03-27 DIAGNOSIS — M791 Myalgia, unspecified site: Secondary | ICD-10-CM | POA: Diagnosis not present

## 2024-03-27 DIAGNOSIS — M25531 Pain in right wrist: Secondary | ICD-10-CM | POA: Diagnosis not present

## 2024-04-06 NOTE — Telephone Encounter (Signed)
 Unable to schedule pt appt.

## 2024-07-27 ENCOUNTER — Other Ambulatory Visit: Payer: Self-pay | Admitting: Family Medicine

## 2024-07-27 MED ORDER — ASPIRIN 81 MG PO TBEC: 81.0000 mg | DELAYED_RELEASE_TABLET | Freq: Every day | ORAL | 3 refills | Status: AC

## 2024-08-05 ENCOUNTER — Other Ambulatory Visit: Payer: Self-pay | Admitting: Family Medicine

## 2024-08-23 ENCOUNTER — Other Ambulatory Visit: Payer: Self-pay | Admitting: Family Medicine

## 2024-08-24 NOTE — Telephone Encounter (Signed)
 Pt wife said that pt will call back to schedule appt

## 2024-08-24 NOTE — Telephone Encounter (Signed)
 Stacks pt NTBS 30-d given 07/27/24

## 2024-08-27 ENCOUNTER — Ambulatory Visit: Payer: Self-pay

## 2024-08-27 ENCOUNTER — Other Ambulatory Visit: Payer: Self-pay | Admitting: Family Medicine

## 2024-08-27 NOTE — Telephone Encounter (Signed)
 FYI Only or Action Required?: FYI only for provider.  Patient was last seen in primary care on 02/06/2024 by [name], MD.  Called Nurse Triage reporting Memory Loss.  Symptoms began several months ago.  Interventions attempted: Nothing.  Symptoms are: gradually worsening.  Triage Disposition: See PCP Within 2 Weeks  Patient/caregiver understands and will follow disposition?: Yes        Copied from CRM [id number]. Topic: Clinical - Red Word Triage >> Aug 27, 2024  9:53 AM Cory Sloan wrote: Red Word that prompted transfer to Nurse Triage: worsening memory and agitated Reason for Disposition  New or worsening memory (forgetfulness) problems  Answer Assessment - Initial Assessment Questions Pts daughter states that it has been going on for some time. She states his wife left him in March and it has been getting worse since then.    1. MAIN CONCERN OR SYMPTOM:  What is your main concern right now? What questions do you have? What's the main symptom you're worried about? (e.g., confusion, memory loss)     Pt is aware of memory loss 2. ONSET:  When did the symptom start (or worsen)? (minutes, hours, days, weeks)     Dtr thinks about March or before 3. BETTER-SAME-WORSE: Are you (the patient) getting better, staying the same, or getting worse compared to the day you (they) were diagnosed or most recent hospital discharge?     Slowly progressing 4. DIAGNOSIS: Was the dementia diagnosed by a doctor? If Yes, ask: When? (e.g., days, months, years ago)     unknown 5. MEDICINES: Has there been any change in medicines recently? (e.g., narcotics, antihistamines, benzodiazepines, etc.)     Not that daughter is aware of 6. OTHER SYMPTOMS: Are there any other symptoms? (e.g., cough, falling, fever, pain)     Unsteady on feet, really agitated, easily 7. SUPPORT: What type of support do you (the patient) have? Note: Document living circumstances and support (e.g., family,  nursing home).     Living by himself  Protocols used: Dementia Symptoms and Questions-A-AH

## 2024-08-27 NOTE — Telephone Encounter (Signed)
 Patient is scheduled to be seen for this on Monday, 10/13.

## 2024-08-31 ENCOUNTER — Encounter: Payer: Self-pay | Admitting: Family Medicine

## 2024-08-31 ENCOUNTER — Ambulatory Visit: Admitting: Family Medicine

## 2024-08-31 VITALS — BP 113/70 | Temp 98.0°F | Ht 71.0 in | Wt 152.0 lb

## 2024-08-31 DIAGNOSIS — G301 Alzheimer's disease with late onset: Secondary | ICD-10-CM | POA: Diagnosis not present

## 2024-08-31 DIAGNOSIS — F321 Major depressive disorder, single episode, moderate: Secondary | ICD-10-CM

## 2024-08-31 DIAGNOSIS — E785 Hyperlipidemia, unspecified: Secondary | ICD-10-CM | POA: Diagnosis not present

## 2024-08-31 DIAGNOSIS — R413 Other amnesia: Secondary | ICD-10-CM | POA: Diagnosis not present

## 2024-08-31 DIAGNOSIS — I10 Essential (primary) hypertension: Secondary | ICD-10-CM | POA: Diagnosis not present

## 2024-08-31 DIAGNOSIS — F02B3 Dementia in other diseases classified elsewhere, moderate, with mood disturbance: Secondary | ICD-10-CM | POA: Diagnosis not present

## 2024-08-31 DIAGNOSIS — I2510 Atherosclerotic heart disease of native coronary artery without angina pectoris: Secondary | ICD-10-CM

## 2024-08-31 LAB — MICROSCOPIC EXAMINATION
Bacteria, UA: NONE SEEN
Renal Epithel, UA: NONE SEEN /HPF
WBC, UA: NONE SEEN /HPF (ref 0–5)
Yeast, UA: NONE SEEN

## 2024-08-31 LAB — URINALYSIS, COMPLETE
Bilirubin, UA: NEGATIVE
Glucose, UA: NEGATIVE
Ketones, UA: NEGATIVE
Leukocytes,UA: NEGATIVE
Nitrite, UA: NEGATIVE
Protein,UA: NEGATIVE
Specific Gravity, UA: 1.025 (ref 1.005–1.030)
Urobilinogen, Ur: 0.2 mg/dL (ref 0.2–1.0)
pH, UA: 6.5 (ref 5.0–7.5)

## 2024-08-31 MED ORDER — DONEPEZIL HCL 5 MG PO TBDP: 5.0000 mg | ORAL_TABLET | Freq: Every day | ORAL | 1 refills | Status: DC

## 2024-08-31 MED ORDER — ESCITALOPRAM OXALATE 10 MG PO TABS: 10.0000 mg | ORAL_TABLET | Freq: Every day | ORAL | 1 refills | Status: DC

## 2024-08-31 NOTE — Progress Notes (Signed)
 Subjective:  Patient ID: Cory Sloan, male    DOB: 1946/10/05  Age: 78 y.o. MRN: [id number]  CC: Altered Mental Status   HPI  Discussed the use of AI scribe software for clinical note transcription with the patient, who gave verbal consent to proceed.  History of Present Illness Cory Sloan is a 78 year old male who presents with memory issues and possible depression.  He has been experiencing significant memory issues that have been worsening over time. He struggles to remember tasks and conversations, often forgetting recent events and frequently repeating himself. His daughter notes that he becomes agitated when reminded of things he has forgotten. These memory issues have been present for some time, potentially even before his wife left seven months ago.  He lives alone with his dog and mentions feeling lonely, which may contribute to his depressive symptoms. He acknowledges 'probably' fighting depression, especially since his wife left him. He enjoys spending time with his son and grandson, which positively impacts his mood.  He has a history of shoulder issues and was previously advised to undergo a shoulder replacement, which he declined. 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  He reports occasional chest discomfort, described as an 'urge in my chest,' particularly at night before bed. He also mentions a recent episode of dizziness after consuming alcohol, which he drinks occasionally, though he states he hasn't consumed any today.  His social history includes living alone, with his daughter and son visiting occasionally. He has a strained relationship with some of his children, who do not visit or communicate regularly. He has a supportive daughter in [place]  who calls him every evening.          08/31/2024   10:24 AM 02/06/2024    1:33 PM  09/27/2023    8:34 AM  Depression screen PHQ 2/9  Decreased Interest 0 1 0  Down, Depressed, Hopeless 3 1 0  PHQ - 2 Score 3 2 0  Altered sleeping 0 1 0  Tired, decreased energy 0 2 0  Change in appetite 2 1 0  Feeling bad or failure about yourself  3 3 0  Trouble concentrating 0 0 0  Moving slowly or fidgety/restless 1 1 0  Suicidal thoughts 1 3 0  PHQ-9 Score 10 13 0  Difficult doing work/chores  Not difficult at all Not difficult at all    History Brandy has a past medical history of Carotid artery stenosis, COPD (chronic obstructive pulmonary disease) (HCC), HLD (hyperlipidemia), ST elevation (STEMI) myocardial infarction involving right coronary artery (HCC) (10/04/2015), STEMI (ST elevation myocardial infarction) [facility]) (November 2016), 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   He has a past surgical history that includes Appendectomy; Throat surgery (2005); Cardiac catheterization (N/A, 10/04/2015); Cardiac catheterization (N/A, 10/04/2015); and Colonoscopy (12/23/2019).   His family history includes Brain cancer in his father; Breast cancer in his mother; CAD in his brother; Cancer - Other in his mother; Colon polyps in his brother.He reports that he quit smoking about 21 years ago. His smoking use included cigarettes. He started smoking about 61 years ago. He has a 60 pack-year smoking history. He has never used smokeless tobacco. He reports that he does not drink alcohol and does not use drugs.    ROS Review of Systems  Constitutional: Negative.   HENT: Negative.    Eyes:  Negative for visual disturbance.  Respiratory:  Negative for cough and shortness of breath.   Cardiovascular:  Negative for chest pain and leg swelling.  Gastrointestinal:  Negative for abdominal pain, diarrhea, nausea and vomiting.  Genitourinary:  Negative for difficulty urinating.  Musculoskeletal:  Negative for arthralgias and myalgias.  Skin:  Negative for rash.  Neurological:  Negative for  headaches.  Psychiatric/Behavioral:  Negative for sleep disturbance.     Objective:  BP 113/70   Temp 98 F (36.7 C)   Ht 5' 11 (1.803 m)   Wt 152 lb (68.9 kg)   SpO2 98%   BMI 21.20 kg/m   BP Readings from Last 3 Encounters:  08/31/24 113/70  02/06/24 111/70  01/27/24 135/89    Wt Readings from Last 3 Encounters:  08/31/24 152 lb (68.9 kg)  02/06/24 150 lb (68 kg)  01/27/24 158 lb 1.1 oz (71.7 kg)     Physical Exam Vitals reviewed.  Constitutional:      Appearance: He is well-developed.  HENT:     Head: Normocephalic and atraumatic.     Right Ear: External ear normal.     Left Ear: External ear normal.     Mouth/Throat:     Pharynx: No oropharyngeal exudate or posterior oropharyngeal erythema.  Eyes:     Pupils: Pupils are equal, round, and reactive to light.  Cardiovascular:     Rate and Rhythm: Normal rate and regular rhythm.     Heart sounds: No murmur heard. Pulmonary:     Effort: No respiratory distress.     Breath sounds: Normal breath sounds.  Musculoskeletal:     Cervical back: Normal range of motion and neck supple.  Neurological:     Mental Status: He is alert and oriented to person, place, and time.    Physical Exam GENERAL: Alert, cooperative, well developed, no acute distress. HEENT: Normocephalic, normal oropharynx, moist mucous membranes. CHEST: Clear to auscultation bilaterally, no wheezes, rhonchi, or crackles. CARDIOVASCULAR: Normal heart rate and rhythm, S1 and S2 normal without murmurs. ABDOMEN: Soft, non-tender, non-distended, without organomegaly, normal bowel sounds. EXTREMITIES: No cyanosis or edema. MUSCULOSKELETAL: Limited shoulder range of motion with 30 degrees flexion and 20 degrees external rotation. NEUROLOGICAL: Cranial nerves grossly intact, moves all extremities without gross motor or sensory deficit.   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 (HCC) -     CBC with Differential/Platelet -     CMP14+EGFR  Hyperlipidemia LDL goal <70 -     CBC with Differential/Platelet -     CMP14+EGFR  Coronary artery disease involving native coronary artery of native heart without angina pectoris -     CBC with Differential/Platelet -     CMP14+EGFR  Essential hypertension -     CBC with Differential/Platelet -     CMP14+EGFR -     Urinalysis, Complete  Current moderate episode of major depressive disorder without prior episode (HCC) -     CBC with Differential/Platelet -     CMP14+EGFR  Other orders -     Escitalopram Oxalate; Take 1 tablet (10 mg total) by mouth daily.  Dispense: 90 tablet; Refill: 1 -     Donepezil HCl; Take 1 tablet (5 mg total) by mouth at bedtime.  Dispense: 30 tablet; Refill: 1  Assessment and Plan Assessment & Plan Alzheimer's disease with behavioral disturbance   His memory issues have worsened since his wife left, with increased forgetfulness, inability to recall recent events, and repetitive questioning. Alzheimer's disease is suspected, possibly complicated by depression. Differential diagnosis includes thyroid  or kidney issues, or infection. Prescribe donepezil once daily at bedtime, with informed consent about potential side effects like nausea and dizziness. Order blood and urine tests to rule out thyroid , kidney issues, or infection. Schedule follow-up in 4-6 weeks to assess treatment response.  Major depressive disorder   He reports feelings of loneliness and depression, likely worsened by living alone and lack of family support. Enjoys family time, which improves his mood. Encourage family involvement and support to enhance mood and provide assistance. Discuss potential need for further intervention if depression persists.  Chronic right shoulder pain with limited range of motion   He experiences chronic right  shoulder pain with limited external rotation and forward flexion. Previously advised to have shoulder replacement but declined. Discuss the option of shoulder replacement if he decides to pursue it in the future.  Alcohol use   He reports occasional alcohol use, with a recent episode of dizziness after drinking. Advise immediate cessation of alcohol use.       Follow-up: Return in about 6 weeks (around 10/12/2024).  Butler Der, M.D.

## 2024-09-01 ENCOUNTER — Ambulatory Visit: Payer: Self-pay | Admitting: Family Medicine

## 2024-09-01 LAB — CBC WITH DIFFERENTIAL/PLATELET
Basophils Absolute: 0.1 x10E3/uL (ref 0.0–0.2)
Basos: 1 %
EOS (ABSOLUTE): 0.1 x10E3/uL (ref 0.0–0.4)
Eos: 2 %
Hematocrit: 42.5 % (ref 37.5–51.0)
Hemoglobin: 13.7 g/dL (ref 13.0–17.7)
Immature Grans (Abs): 0 x10E3/uL (ref 0.0–0.1)
Immature Granulocytes: 0 %
Lymphocytes Absolute: 1.7 x10E3/uL (ref 0.7–3.1)
Lymphs: 20 %
MCH: 31.2 pg (ref 26.6–33.0)
MCHC: 32.2 g/dL (ref 31.5–35.7)
MCV: 97 fL (ref 79–97)
Monocytes Absolute: 0.8 x10E3/uL (ref 0.1–0.9)
Monocytes: 9 %
Neutrophils Absolute: 5.5 x10E3/uL (ref 1.4–7.0)
Neutrophils: 68 %
Platelets: 230 x10E3/uL (ref 150–450)
RBC: 4.39 x10E6/uL (ref 4.14–5.80)
RDW: 12.1 % (ref 11.6–15.4)
WBC: 8.1 x10E3/uL (ref 3.4–10.8)

## 2024-09-01 LAB — CMP14+EGFR
ALT: 21 IU/L (ref 0–44)
AST: 22 IU/L (ref 0–40)
Albumin: 4.3 g/dL (ref 3.8–4.8)
Alkaline Phosphatase: 88 IU/L (ref 47–123)
BUN/Creatinine Ratio: 21 (ref 10–24)
BUN: 16 mg/dL (ref 8–27)
Bilirubin Total: 0.5 mg/dL (ref 0.0–1.2)
CO2: 26 mmol/L (ref 20–29)
Calcium: 9.3 mg/dL (ref 8.6–10.2)
Chloride: 106 mmol/L (ref 96–106)
Creatinine, Ser: 0.78 mg/dL (ref 0.76–1.27)
Globulin, Total: 2.1 g/dL (ref 1.5–4.5)
Glucose: 90 mg/dL (ref 70–99)
Potassium: 3.9 mmol/L (ref 3.5–5.2)
Sodium: 144 mmol/L (ref 134–144)
Total Protein: 6.4 g/dL (ref 6.0–8.5)
eGFR: 92 mL/min/1.73 (ref >59)

## 2024-09-01 LAB — TSH: TSH: 2.98 u[IU]/mL (ref 0.450–4.500)

## 2024-09-01 NOTE — Progress Notes (Signed)
 Hello [place],  Your lab result is normal and/or stable.Some minor variations that are not significant are commonly marked abnormal, but do not represent any medical problem for you.  Best regards, Mechele Claude, M.D.

## 2024-09-22 ENCOUNTER — Other Ambulatory Visit: Payer: Self-pay | Admitting: *Deleted

## 2024-10-13 ENCOUNTER — Ambulatory Visit (INDEPENDENT_AMBULATORY_CARE_PROVIDER_SITE_OTHER): Payer: Self-pay | Admitting: Family Medicine

## 2024-10-13 ENCOUNTER — Encounter: Payer: Self-pay | Admitting: Family Medicine

## 2024-10-13 VITALS — BP 104/61 | HR 72 | Temp 97.8°F | Ht 71.0 in | Wt 155.0 lb

## 2024-10-13 DIAGNOSIS — F321 Major depressive disorder, single episode, moderate: Secondary | ICD-10-CM

## 2024-10-13 DIAGNOSIS — G301 Alzheimer's disease with late onset: Secondary | ICD-10-CM

## 2024-10-13 DIAGNOSIS — E785 Hyperlipidemia, unspecified: Secondary | ICD-10-CM

## 2024-10-13 DIAGNOSIS — I10 Essential (primary) hypertension: Secondary | ICD-10-CM

## 2024-10-13 DIAGNOSIS — F02B3 Dementia in other diseases classified elsewhere, moderate, with mood disturbance: Secondary | ICD-10-CM | POA: Diagnosis not present

## 2024-10-13 MED ORDER — DONEPEZIL HCL 5 MG PO TBDP: 5.0000 mg | ORAL_TABLET | Freq: Every day | ORAL | 0 refills | Status: DC

## 2024-10-13 MED ORDER — ESCITALOPRAM OXALATE 20 MG PO TABS: 20.0000 mg | ORAL_TABLET | Freq: Every day | ORAL | 1 refills | Status: DC

## 2024-10-13 MED ORDER — CARVEDILOL 3.125 MG PO TABS: 3.1250 mg | ORAL_TABLET | Freq: Two times a day (BID) | ORAL | 3 refills | Status: AC

## 2024-10-13 MED ORDER — CLOPIDOGREL BISULFATE 75 MG PO TABS: 75.0000 mg | ORAL_TABLET | Freq: Every day | ORAL | 2 refills | Status: AC

## 2024-10-13 NOTE — Progress Notes (Signed)
 Subjective:  Patient ID: Cory Sloan, male    DOB: Oct 19, 1946  Age: 78 y.o. MRN: [id number]  CC: Medical Management of Chronic Issues   HPI  Discussed the use of AI scribe software for clinical note transcription with the patient, who gave verbal consent to proceed.  History of Present Illness Cory Sloan is a 78 year old male who presents for a follow-up on new medications for mood and memory.  He is currently taking donepezil  at bedtime for memory enhancement and reports significant improvement, stating he can remember events from three months ago. No nausea or dizziness, which are potential side effects of the medication.  Regarding mood, he feels 'annoyed' when alone, especially since his wife left him. His sons do not visit often, contributing to his feelings of isolation. He has some social support from friends who visit and take him out, but he expresses a sense of loneliness and frustration with his family dynamics.  He is also taking clopidogrel  (Plavix ) and has had a recent refill of this medication.  No shortness of breath.          10/13/2024   11:08 AM 08/31/2024   10:24 AM 02/06/2024    1:33 PM  Depression screen PHQ 2/9  Decreased Interest 1 0 1  Down, Depressed, Hopeless 1 3 1   PHQ - 2 Score 2 3 2   Altered sleeping 0 0 1  Tired, decreased energy 3 0 2  Change in appetite 1 2 1   Feeling bad or failure about yourself  1 3 3   Trouble concentrating 2 0 0  Moving slowly or fidgety/restless 0 1 1  Suicidal thoughts 0 1 3  PHQ-9 Score 9 10  13    Difficult doing work/chores Very difficult  Not difficult at all     Data saved with a previous flowsheet row definition    History [name] has a past medical history of Carotid artery stenosis, COPD (chronic obstructive pulmonary disease) (HCC), HLD (hyperlipidemia), ST elevation (STEMI) myocardial infarction involving right coronary artery (HCC) (10/04/2015), STEMI (ST elevation myocardial infarction)  [facility]) (November 2016), 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   He has a past surgical history that includes Appendectomy; Throat surgery (2005); Cardiac catheterization (N/A, 10/04/2015); Cardiac catheterization (N/A, 10/04/2015); and Colonoscopy (12/23/2019).   His family history includes Brain cancer in his father; Breast cancer in his mother; CAD in his brother; Cancer - Other in his mother; Colon polyps in his brother.He reports that he quit smoking about 21 years ago. His smoking use included cigarettes. He started smoking about 61 years ago. He has a 60 pack-year smoking history. He has never used smokeless tobacco. He reports that he does not drink alcohol and does not use drugs.    ROS Review of Systems  Objective:  BP 104/61   Pulse 72   Temp 97.8 F (36.6 C)   Ht 5' 11 (1.803 m)   Wt 155 lb (70.3 kg)   SpO2 98%   BMI 21.62 kg/m   BP Readings from Last 3 Encounters:  10/13/24 104/61  08/31/24 113/70  02/06/24 111/70    Wt Readings from Last 3 Encounters:  10/13/24 155 lb (70.3 kg)  08/31/24 152 lb (68.9 kg)  02/06/24 150 lb (68 kg)     Physical Exam Physical Exam GENERAL: Alert, cooperative, well developed, no acute distress HEENT: Normocephalic, normal oropharynx, moist mucous membranes CHEST: Clear to auscultation bilaterally, No wheezes, rhonchi, or crackles CARDIOVASCULAR: Normal heart rate and rhythm, S1  and S2 normal without murmurs ABDOMEN: Soft, non-tender, non-distended, without organomegaly, Normal bowel sounds EXTREMITIES: No cyanosis or edema NEUROLOGICAL: Cranial nerves grossly intact, Moves all extremities without gross motor or sensory deficit   Assessment & Plan:  Hyperlipidemia LDL goal <70  Essential hypertension  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  Dispense: 180 tablet; Refill: 3 -     Clopidogrel  Bisulfate; Take 1 tablet (75 mg total) by mouth daily.  Dispense: 90 tablet; Refill: 2 -     Escitalopram  Oxalate; Take 1 tablet (20 mg total) by mouth at bedtime.  Dispense: 90 tablet; Refill: 1 -     Donepezil  HCl; Take 1 tablet (5 mg total) by mouth at bedtime.  Dispense: 90 tablet; Refill: 0    Assessment and Plan Assessment & Plan Depression   He continues to experience feelings of annoyance when alone, worsened by separation from his spouse and limited family support. Current treatment with escitalopram  is insufficient. Increase escitalopram  to 20 mg daily at bedtime. Offered referral to a counselor for family issues.  Cognitive impairment   Managed with donepezil , which has improved memory recall. No significant side effects reported, though nausea and dizziness are potential side effects. Continue donepezil  as currently prescribed.       Follow-up: Return in about 3 months (around 01/13/2025).  Butler Der, M.D.

## 2024-11-17 ENCOUNTER — Telehealth: Payer: Self-pay | Admitting: *Deleted

## 2024-11-17 NOTE — Telephone Encounter (Signed)
 Musu Jerrell from [facility] contacted our office regarding patient and his cognitive decline. She is asking for a letter from PCP regarding concerns for cognitive decline and any recommendations the PCP has.  She asks for PCP's recommendations for guardianship or Power of Roy. She states most recently that patient got lost while driving.  She gave us  her contact information.  Phone: [phone] Fax:[phone]  E-mail: [email or web address]

## 2024-11-18 NOTE — Telephone Encounter (Signed)
 Dr. Maryanne,  Will you be able to complete this? Not sure it can wait until [facility] returns.

## 2024-11-18 NOTE — Telephone Encounter (Signed)
 I do not know the patient at all so I do not know that I can write a letter stating this unless Dr. Zollie very explicitly stated in his note, if he did then we can copy a word for word I will sign not but if he did not put it in his notes then I cannot attest to this.

## 2024-11-23 NOTE — Telephone Encounter (Signed)
 Called to clarify with them what is needed. Cognitive decline documented but no documentation regarding guardianship or power of attorney. Unable to lm due to full VM. LS

## 2024-11-25 ENCOUNTER — Telehealth: Payer: Self-pay

## 2024-11-25 NOTE — Telephone Encounter (Signed)
 Copied from CRM [id number]. Topic: General - Other >> Nov 25, 2024 11:07 AM Tonda B wrote: Reason for CRM: [facility] returning call please call [phone]

## 2024-11-25 NOTE — Telephone Encounter (Signed)
 Attempted call back. Went to [employer].  Mailbox full. Unable to leave message.

## 2024-11-26 NOTE — Telephone Encounter (Signed)
 [facility] returned call . I spoke to [employer].  I informed that Dr. Zollie is out till January 19th. I informed that covering providers are not comfortable writing recommendations for patient without proper documentation in patients medical record. As of now there is not sufficient documentation to address provider recommendations for guardianship or POA. As of now there is insufficient documentation addressing restrictions that should or should not be placed on patient.

## 2024-11-28 ENCOUNTER — Ambulatory Visit: Admission: EM | Admit: 2024-11-28 | Discharge: 2024-11-28 | Disposition: A | Discharge status: Home / Self Care

## 2024-11-28 ENCOUNTER — Other Ambulatory Visit: Payer: Self-pay

## 2024-11-28 ENCOUNTER — Emergency Department (HOSPITAL_COMMUNITY)
Admission: EM | Admit: 2024-11-28 | Discharge: 2024-11-28 | Disposition: A | Discharge status: Home / Self Care | Attending: Emergency Medicine | Admitting: Emergency Medicine

## 2024-11-28 ENCOUNTER — Emergency Department (HOSPITAL_COMMUNITY)

## 2024-11-28 ENCOUNTER — Encounter (HOSPITAL_COMMUNITY): Payer: Self-pay

## 2024-11-28 DIAGNOSIS — I2510 Atherosclerotic heart disease of native coronary artery without angina pectoris: Secondary | ICD-10-CM | POA: Diagnosis not present

## 2024-11-28 DIAGNOSIS — Z7902 Long term (current) use of antithrombotics/antiplatelets: Secondary | ICD-10-CM | POA: Insufficient documentation

## 2024-11-28 DIAGNOSIS — Z79899 Other long term (current) drug therapy: Secondary | ICD-10-CM | POA: Insufficient documentation

## 2024-11-28 DIAGNOSIS — R0789 Other chest pain: Secondary | ICD-10-CM | POA: Insufficient documentation

## 2024-11-28 DIAGNOSIS — Z7982 Long term (current) use of aspirin: Secondary | ICD-10-CM | POA: Insufficient documentation

## 2024-11-28 DIAGNOSIS — J449 Chronic obstructive pulmonary disease, unspecified: Secondary | ICD-10-CM | POA: Insufficient documentation

## 2024-11-28 DIAGNOSIS — I252 Old myocardial infarction: Secondary | ICD-10-CM | POA: Diagnosis not present

## 2024-11-28 DIAGNOSIS — R0602 Shortness of breath: Secondary | ICD-10-CM

## 2024-11-28 DIAGNOSIS — R059 Cough, unspecified: Secondary | ICD-10-CM | POA: Insufficient documentation

## 2024-11-28 DIAGNOSIS — I712 Thoracic aortic aneurysm, without rupture, unspecified: Secondary | ICD-10-CM

## 2024-11-28 DIAGNOSIS — R079 Chest pain, unspecified: Secondary | ICD-10-CM

## 2024-11-28 DIAGNOSIS — R051 Acute cough: Secondary | ICD-10-CM

## 2024-11-28 LAB — CBC
HCT: 42.1 % (ref 39.0–52.0)
Hemoglobin: 13.8 g/dL (ref 13.0–17.0)
MCH: 30.7 pg (ref 26.0–34.0)
MCHC: 32.8 g/dL (ref 30.0–36.0)
MCV: 93.6 fL (ref 80.0–100.0)
Platelets: 187 K/uL (ref 150–400)
RBC: 4.5 MIL/uL (ref 4.22–5.81)
RDW: 13 % (ref 11.5–15.5)
WBC: 9.4 K/uL (ref 4.0–10.5)
nRBC: 0 % (ref 0.0–0.2)

## 2024-11-28 LAB — BASIC METABOLIC PANEL WITH GFR
Anion gap: 8 (ref 5–15)
BUN: 10 mg/dL (ref 8–23)
CO2: 30 mmol/L (ref 22–32)
Calcium: 8.9 mg/dL (ref 8.9–10.3)
Chloride: 102 mmol/L (ref 98–111)
Creatinine, Ser: 0.85 mg/dL (ref 0.61–1.24)
GFR, Estimated: >60 mL/min
Glucose, Bld: 86 mg/dL (ref 70–99)
Potassium: 3.7 mmol/L (ref 3.5–5.1)
Sodium: 139 mmol/L (ref 135–145)

## 2024-11-28 LAB — TROPONIN T, HIGH SENSITIVITY
Troponin T High Sensitivity: <15 ng/L (ref 0–19)
Troponin T High Sensitivity: <15 ng/L (ref 0–19)

## 2024-11-28 MED ORDER — LIDOCAINE 5 % EX PTCH
1.0000 | MEDICATED_PATCH | CUTANEOUS | Status: DC
  Administered 2024-11-28: 1 via TRANSDERMAL
  Filled 2024-11-28: qty 1

## 2024-11-28 MED ORDER — FENTANYL CITRATE (PF) 100 MCG/2ML IJ SOLN
50.0000 ug | Freq: Once | INTRAMUSCULAR | Status: AC
  Administered 2024-11-28: 50 ug via INTRAVENOUS
  Filled 2024-11-28: qty 2

## 2024-11-28 MED ORDER — BENZONATATE 100 MG PO CAPS: 100.0000 mg | ORAL_CAPSULE | Freq: Three times a day (TID) | ORAL | 0 refills | Status: DC

## 2024-11-28 NOTE — Discharge Instructions (Addendum)
 Go directly to the emergency department for immediate further evaluation.  You have declined EMS transport.

## 2024-11-28 NOTE — ED Triage Notes (Signed)
 Pt reports he has left side chest pain and SHOB x 2 weeks

## 2024-11-28 NOTE — ED Triage Notes (Signed)
 Pt c/o L chest pain and SOB x 1 month but worsening x2 weeks.  Pain score 8/10.  Pt noted to have a congested cough.  Pt has significant medical history.

## 2024-11-28 NOTE — ED Notes (Signed)
 Patient is being discharged from the Urgent Care and sent to the Emergency Department via POV . Per provider, patient is in need of higher level of care due to chest pain. Patient is aware and verbalizes understanding of plan of care.  Vitals:   11/28/24 1114  BP: (!) 146/85  Pulse: 82  Resp: 20  Temp: 98.3 F (36.8 C)  SpO2: 97%

## 2024-11-28 NOTE — ED Provider Notes (Addendum)
 " [place] [facility] EMERGENCY DEPARTMENT AT [facility] Provider Note   CSN: [id number] Arrival date & time: 11/28/24  1129     Patient presents with: Chest Pain and Cough   Cory Sloan is a 79 y.o. male.  He has a history of CAD with NSTEMI status post cardiac catheterization, thoracic aortic aneurysm, hyperlipidemia, COPD, who presents ER today for chest pain shortness of breath, has been worsening over the past couple of weeks.  He was sent for further evaluation for urgent care.  Patient states he been having some increased shortness of breath for a couple of weeks but over the past 3 days of having severe left lateral chest pain that is worse with movement and palpation.  It is not worse with exertion and it is constant in nature.  He denies any trauma to the chest but family states that right before this started he been lifting very heavy wood.  He has not been having any dizziness or diaphoresis.    Chest Pain Associated symptoms: cough   Cough Associated symptoms: chest pain        Prior to Admission medications  Medication Sig Start Date End Date Taking? Authorizing Provider  aspirin  EC 81 MG tablet Take 1 tablet (81 mg total) by mouth daily. 07/27/24   [name], MD  atorvastatin  (LIPITOR ) 80 MG tablet TAKE 1 TABLET (80 MG TOTAL) BY MOUTH DAILY AT 6 PM. 02/06/24   [name], MD  carvedilol  (COREG ) 3.125 MG tablet Take 1 tablet (3.125 mg total) by mouth 2 (two) times daily with a meal. 10/13/24   [name], MD  clopidogrel  (PLAVIX ) 75 MG tablet Take 1 tablet (75 mg total) by mouth daily. 10/13/24   [name], MD  donepezil  (ARICEPT  ODT) 5 MG disintegrating tablet Take 1 tablet (5 mg total) by mouth at bedtime. 10/13/24   [name], MD  escitalopram  (LEXAPRO ) 20 MG tablet Take 1 tablet (20 mg total) by mouth at bedtime. 10/13/24   [name], MD  ezetimibe  (ZETIA ) 10 MG tablet Take 1 tablet (10 mg total) by mouth daily. **NEEDS TO BE SEEN  BEFORE NEXT REFILL** 08/05/24   [name], MD  meclizine  (ANTIVERT ) 25 MG tablet Take 2 tablets (50 mg total) by mouth 3 (three) times daily as needed for dizziness. 08/11/23   [name], MD  nitroGLYCERIN  (NITROSTAT ) 0.4 MG SL tablet Place 1 tablet (0.4 mg total) under the tongue every 5 (five) minutes as needed for chest pain (CP or SOB). 08/07/22   [name], MD  Soft Lens Products (STERILE SALINE) SOLN Use saily to flush trach after use of Inhaler Jonda) 12/16/18   [name], MD    Allergies: Patient has no known allergies.    Review of Systems  Respiratory:  Positive for cough.   Cardiovascular:  Positive for chest pain.    Updated Vital Signs BP (!) 144/86   Pulse 69   Resp 16   Ht 5' 11 (1.803 m)   Wt 70.3 kg   SpO2 98%   BMI 21.62 kg/m   Physical Exam Vitals and nursing note reviewed.  Constitutional:      General: He is not in acute distress.    Appearance: He is well-developed.  HENT:     Head: Normocephalic and atraumatic.  Eyes:     Extraocular Movements: Extraocular movements intact.     Conjunctiva/sclera: Conjunctivae normal.     Pupils: Pupils are equal, round, and reactive to light.  Cardiovascular:  Rate and Rhythm: Normal rate and regular rhythm.     Heart sounds: No murmur heard. Pulmonary:     Effort: Pulmonary effort is normal. No respiratory distress.     Breath sounds: Normal breath sounds.  Chest:     Chest wall: Tenderness present. No crepitus or edema.     Comments: On the lateral edge of the pectoralis the left side of the chest patient has reproducible tenderness, it is completely reproduced with this.  Pain is also reproducible when he uses a arm to try to lean forward for me. Abdominal:     Palpations: Abdomen is soft.     Tenderness: There is no abdominal tenderness.  Musculoskeletal:        General: No swelling.     Cervical back: Neck supple.     Right lower leg: No tenderness. No edema.     Left lower leg: No  tenderness. No edema.  Skin:    General: Skin is warm and dry.     Capillary Refill: Capillary refill takes less than 2 seconds.  Neurological:     General: No focal deficit present.     Mental Status: He is alert and oriented to person, place, and time.  Psychiatric:        Mood and Affect: Mood normal.        Behavior: Behavior normal.     (all labs ordered are listed, but only abnormal results are displayed) Labs Reviewed  BASIC METABOLIC PANEL WITH GFR  CBC  TROPONIN T, HIGH SENSITIVITY    EKG: None  Radiology: No results found.   Procedures   Medications Ordered in the ED - No data to display                                  Medical Decision Making Differential diagnosis includes but not limited to ACS, PE, pneumonia, muscle strain, contusion, rib fracture, other  Chest x-ray ordered and interpreted by me, no pulmonary edema or infiltrate no pneumothorax, I agree with radiology reading.  Patient is maintained on cardiac monitor in sinus rhythm, ECG today showed sinus rhythm with 78 bpm.  Nonpecific repolarization abnormality, similar to prior EKG. troponin was negative x 2, CBC and BMP are normal.  I reexamined the patient, and he had a lidocaine  patch and 50 mcg of fentanyl , his symptoms have fully resolved.  I discussed with patient that his pain is very reproducible, regarding the pain and I feel this is likely due to muscle strain from lifting heavy wood.  His heart score is however moderate risk and he has known history of CAD with prior stent.  He does report some increasing dyspnea.  I discussed that it would be prudent to follow-up with cardiology.  He has also had a cough but has no fever, no sputum production, chest x-ray does not show pneumonia.  Oxygen  saturation is 97 to 98% on room air.  Discussed supportive care at home for cough.  They were given strict return precautions.  Noted to have history of a thoracic aortic aneurysm, last CTA was in 2025.  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  I discussed with patient today that we had repeat CTA today, as symptoms do not seem typical of acute aortic syndrome, although could not prove without imaging,  So we shared decision making  between patient and his daughter Charlies Griffin.  They do not want further workup including a CT at this time, we discussed if he had severe pain, pain radiating to his back, dizziness or sweating, or other worsening symptoms he would need to come back to the ER immediately, otherwise he can follow-up closely with vascular surgery.  Patient just moved in with his daughter, she is requesting that he be referred to cardiology for follow-up.   which be more convenient for them.  Amount and/or Complexity of Data Reviewed External Data Reviewed: labs and notes. Labs: ordered. Decision-making details documented in ED Course. Radiology: ordered and independent interpretation performed. Decision-making details documented in ED Course.  Risk Prescription drug management.        Final diagnoses:  None    ED Discharge Orders     None          Suellen Sherran LABOR, PA-C 11/28/24 [address] 11/28/24 1432    Dean Clarity, MD 11/28/24 1553  "

## 2024-11-28 NOTE — ED Provider Notes (Addendum)
 " [facility]    CSN: [id number] Arrival date & time: 11/28/24  1106      History   Chief Complaint No chief complaint on file.   HPI Cory Sloan is a 79 y.o. male.   Patient presenting today with family members for evaluation of 2 weeks to 1 month history of progressively worsening left-sided chest pain, shortness of breath.  He is a poor historian and cannot offer much specific detail regarding his symptoms today, but family members were with him today states that he has been complaining for at least 2 weeks and over the past 3 days symptoms seem to have become worse and constant.  1 family member with him today states he came into her workplace yesterday falling against the wall clutching his chest but continuously refuses to go to the emergency department for further evaluation.  He denies cough, wheezing, fevers, syncopal episodes, nausea, vomiting, diaphoresis.  Complicated past medical history to include COPD, hyperlipidemia, hypertension, history of STEMI status post cardiac catheterization, laryngeal cancer, thoracic aortic aneurysm.  Has not been trying anything over-the-counter for symptoms.    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   Tobacco abuse     Patient Active Problem List   Diagnosis Date Noted   Essential hypertension 01/08/2021   History of laryngeal cancer 11/23/2019   Carotid artery stenosis    Abdominal aortic atherosclerosis 03/11/2017   Thoracic aortic aneurysm without rupture 03/11/2017   Coronary  artery disease involving native coronary artery of native heart without angina pectoris    Hyperlipidemia LDL goal <70 03/11/2016   Chronic obstructive pulmonary disease (HCC)     Past Surgical History:  Procedure Laterality Date   APPENDECTOMY     CARDIAC CATHETERIZATION N/A 10/04/2015   Procedure: Left Heart Cath and Coronary Angiography;  Surgeon: Debby DELENA Sor, MD;  Location: [facility];  Service: Cardiovascular;  Laterality: N/A;   CARDIAC CATHETERIZATION N/A 10/04/2015   Procedure: Coronary Stent Intervention;  Surgeon: Debby DELENA Sor, MD;  Location: [facility];  Service: Cardiovascular;  Laterality: N/A;   COLONOSCOPY  12/23/2019   [place]  2005   [place]       Home Medications    Prior to Admission medications  Medication Sig Start Date End Date Taking? Authorizing Provider  aspirin  EC 81 MG tablet Take 1 tablet (81 mg total) by mouth daily. 07/27/24   [name], MD  atorvastatin  (LIPITOR ) 80 MG tablet TAKE 1 TABLET (80 MG TOTAL) BY MOUTH DAILY AT 6 PM. 02/06/24   [name], MD  carvedilol  (COREG ) 3.125 MG tablet Take 1 tablet (3.125 mg total) by mouth 2 (two) times daily with a meal. 10/13/24   [name], MD  clopidogrel  (PLAVIX ) 75 MG tablet Take 1 tablet (75 mg total) by mouth daily. 10/13/24   [name], MD  donepezil  (ARICEPT  ODT) 5 MG disintegrating tablet Take 1 tablet (5 mg total) by mouth at bedtime. 10/13/24   [name], MD  escitalopram  (LEXAPRO ) 20 MG tablet  Take 1 tablet (20 mg total) by mouth at bedtime. 10/13/24   [name], MD  ezetimibe  (ZETIA ) 10 MG tablet Take 1 tablet (10 mg total) by mouth daily. **NEEDS TO BE SEEN BEFORE NEXT REFILL** 08/05/24   [name], MD  meclizine  (ANTIVERT ) 25 MG tablet Take 2 tablets (50 mg total) by mouth 3 (three) times daily as needed for dizziness. 08/11/23   [name], MD  nitroGLYCERIN  (NITROSTAT ) 0.4 MG SL tablet Place 1 tablet (0.4 mg total) under the tongue every 5  (five) minutes as needed for chest pain (CP or SOB). 08/07/22   [name], MD  Soft Lens Products (STERILE SALINE) SOLN Use saily to flush trach after use of Inhaler Jonda) 12/16/18   [name], MD    Family History Family History  Problem Relation Age of Onset   Cancer - Other Mother    Breast cancer Mother    Brain cancer Father    CAD Brother    Colon polyps Brother    Colon cancer Neg Hx    Esophageal cancer Neg Hx    Rectal cancer Neg Hx    Stomach cancer Neg Hx     Social History Social History[1]   Allergies   Patient has no known allergies.   Review of Systems Review of Systems Per HPI  Physical Exam Triage Vital Signs ED Triage Vitals  Encounter Vitals Group     BP 11/28/24 1114 (!) 146/85     Girls Systolic BP Percentile --      Girls Diastolic BP Percentile --      Boys Systolic BP Percentile --      Boys Diastolic BP Percentile --      Pulse Rate 11/28/24 1114 82     Resp 11/28/24 1114 20     Temp 11/28/24 1114 98.3 F (36.8 C)     Temp Source 11/28/24 1114 Oral     SpO2 11/28/24 1114 97 %     Weight --      Height --      Head Circumference --      Peak Flow --      Pain Score 11/28/24 1115 0     Pain Loc --      Pain Education --      Exclude from Growth Chart --    No data found.  Updated Vital Signs BP (!) 146/85 (BP Location: Right Arm)   Pulse 82   Temp 98.3 F (36.8 C) (Oral)   Resp 20   SpO2 97%   Visual Acuity Right Eye Distance:   Left Eye Distance:   Bilateral Distance:    Right Eye Near:   Left Eye Near:    Bilateral Near:      Exam abbreviated today as decision was made immediately to go to the emergency department for further evaluation Physical Exam Vitals and nursing note reviewed.  Constitutional:      General: He is not in acute distress.    Appearance: Normal appearance. He is not diaphoretic.  HENT:     Head: Atraumatic.  Eyes:     Extraocular Movements: Extraocular movements intact.      Conjunctiva/sclera: Conjunctivae normal.  Cardiovascular:     Rate and Rhythm: Normal rate.  Pulmonary:     Effort: Pulmonary effort is normal.  Musculoskeletal:        General: Normal range of motion.     Cervical back: Normal range of motion and neck supple.  Skin:  General: Skin is warm and dry.  Neurological:     Mental Status: Mental status is at baseline.  Psychiatric:        Mood and Affect: Mood normal.        Thought Content: Thought content normal.        Judgment: Judgment normal.      UC Treatments / Results  Labs (all labs ordered are listed, but only abnormal results are displayed) Labs Reviewed - No data to display  EKG   Radiology No results found.  Procedures Procedures (including critical care time)  Medications Ordered in UC Medications - No data to display  Initial Impression / Assessment and Plan / UC Course  I have reviewed the triage vital signs and the nursing notes.  Pertinent labs & imaging results that were available during my care of the patient were reviewed by me and considered in my medical decision making (see chart for details).     Vital signs currently reassuring with only mild elevation in blood pressure, EKG today showing normal sinus rhythm at 78 bpm with nonspecific ST and T wave changes but no ST elevations noted, he is well-appearing and in no apparent distress but given his significant risk factors for cardiopulmonary causes of his symptoms recommend immediate further evaluation in the emergency department.  His family members are agreeable to driving him via private vehicle which is his request at this time.  He declines EMS transport.  He is currently hemodynamically stable for transport.  Final Clinical Impressions(s) / UC Diagnoses   Final diagnoses:  Chest pain, unspecified type  SOB (shortness of breath)  History of ST elevation myocardial infarction (STEMI)     Discharge Instructions      Go directly to the  emergency department for immediate further evaluation.  You have declined EMS transport.    ED Prescriptions   None    PDMP not reviewed this encounter.    Stuart Vernell Norris, [place] 11/28/24 1126     [1]  Social History Tobacco Use   Smoking status: Former    Current packs/day: 0.00    Average packs/day: 1.5 packs/day for 40.0 years (60.0 ttl pk-yrs)    Types: Cigarettes    Start date: 08/20/1963    Quit date: 08/20/2003    Years since quitting: 21.2   Smokeless tobacco: Never  Vaping Use   Vaping status: Never Used  Substance Use Topics   Alcohol use: No    Alcohol/week: 0.0 standard drinks of alcohol   Drug use: No     Stuart Vernell Norris, PA-C 11/28/24 1435  "

## 2024-11-28 NOTE — Discharge Instructions (Addendum)
 You were seen today for chest pain, the pain is over to the left side and is very reproducible, fortunately your cardiac workup was normal, your chest x-ray was normal and your pain has resolved.  This is likely related to muscle pull from lifting heavy objects.  Avoid lifting heavy objects.  Given your history of heart disease, however it would be prudent for you to follow-up with the heart doctor.  I am putting a referral for them to follow-up with you.  You also have this history of a thoracic aortic aneurysm.  We discussed getting imaging today for this but since your symptoms seem to be unrelated, elected to follow-up as an outpatient.  You need to follow-up with vascular surgery.  I have included the number for Dr. Silver office. You will need to call to schedule close outpatient followup.

## 2024-12-01 ENCOUNTER — Encounter: Payer: Self-pay | Admitting: Student

## 2024-12-01 ENCOUNTER — Ambulatory Visit: Attending: Student | Admitting: Student

## 2024-12-01 VITALS — BP 102/74 | HR 77 | Ht 71.0 in | Wt 150.0 lb

## 2024-12-01 DIAGNOSIS — E785 Hyperlipidemia, unspecified: Secondary | ICD-10-CM

## 2024-12-01 DIAGNOSIS — I2510 Atherosclerotic heart disease of native coronary artery without angina pectoris: Secondary | ICD-10-CM

## 2024-12-01 DIAGNOSIS — I7121 Aneurysm of the ascending aorta, without rupture: Secondary | ICD-10-CM

## 2024-12-01 DIAGNOSIS — J449 Chronic obstructive pulmonary disease, unspecified: Secondary | ICD-10-CM

## 2024-12-01 MED ORDER — NITROGLYCERIN 0.4 MG SL SUBL: 0.4000 mg | SUBLINGUAL_TABLET | SUBLINGUAL | 2 refills | Status: AC | PRN

## 2024-12-01 MED ORDER — EZETIMIBE 10 MG PO TABS: 10.0000 mg | ORAL_TABLET | Freq: Every day | ORAL | 3 refills | Status: AC

## 2024-12-01 MED ORDER — ATORVASTATIN CALCIUM 80 MG PO TABS: ORAL_TABLET | ORAL | 3 refills | Status: AC

## 2024-12-01 NOTE — Progress Notes (Unsigned)
 "  Cardiology Office Note    Date:  12/02/2024  ID:  Cory Sloan, DOB Aug 20, 1946, MRN [id number] Cardiologist: Previously Dr. Burnard (wants to follow-up in [facility])  History of Present Illness:    Cory Sloan is a 79 y.o. male with past medical history of CAD (s/p STEMI in 09/2015 with DES to RCA, low-risk NST in 03/2017), ascending aortic aneurysm (measuring 4.1 cm by CT in 01/2024 with annual imaging recommended), HLD, COPD, prior tobacco use and history of throat cancer who presents to the office today for follow-up from a recent Emergency Department visit.  He was last examined by Dr. Burnard in 12/2023 and remained active at baseline and was still working in taking down trees and denied any recent anginal symptoms. He had been continued on long-term DAPT with ASA and Plavix  and was also continued on Atorvastatin  80 mg daily, Zetia  10 mg daily and Coreg  3.125 mg twice daily. Given Dr. Joesphine retirement, he was going to transition care to Dr. Anner with follow-up in 1 year.  He most recently presented to [facility] ED on 11/28/2024 for evaluation of chest pain and shortness of breath over the past 2 months. Pain was not worse with exertion and was worse with movement and palpation. Troponin values were negative and EKG was without acute ST changes. A Lidocaine  patch was placed and he received Fentanyl  with resolution of pain. Symptoms were felt to be due to a musculoskeletal etiology but he was informed to follow-up with Cardiology as an outpatient.  In talking with the patient and his daughter today, he reports he developed chest pain which led to ED evaluation after lifting heavy tree limbs at his work. 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 He does have pain today which is reproducible with palpation or movement of his arm. No association with exertion.  Reports his breathing has been stable and no dyspnea on exertion, orthopnea, PND or pitting edema. His daughter is helping  with medications as he recently moved in with her following a separation from his wife of 30+ years.  Studies Reviewed:   EKG: EKG is not ordered today.   NST: 03/2017 The left ventricular ejection fraction is mildly decreased (45-54%). Nuclear stress EF: 46%. There was no ST segment deviation noted during stress. Findings consistent with prior myocardial infarction. This is a low risk study.   Small inferior wall infarct at mid and basal level EF 46% No ischemia  Echocardiogram: 02/2021 IMPRESSIONS     1. Left ventricular ejection fraction, by estimation, is 55 to 60%. The  left ventricle has normal function. Left ventricular diastolic parameters  are indeterminate.   2. Right ventricular systolic function is normal. The right ventricular  size is normal. Mildly increased right ventricular wall thickness.   3. The mitral valve is normal in structure. Mild mitral valve  regurgitation.   4. The aortic valve is tricuspid. Aortic valve regurgitation is not  visualized. Mild to moderate aortic valve sclerosis/calcification is  present, without any evidence of aortic stenosis.   5. The inferior vena cava is normal in size with <50% respiratory  variability, suggesting right atrial pressure of 8 mmHg.   Comparison(s): The left ventricular function is unchanged.    Physical Exam:   VS:  BP 102/74   Pulse 77   Ht 5' 11 (1.803 m)   Wt 150 lb (68 kg)   SpO2 92%   BMI 20.92 kg/m    Wt Readings from Last 3  Encounters:  12/01/24 150 lb (68 kg)  11/28/24 155 lb (70.3 kg)  10/13/24 155 lb (70.3 kg)     GEN: Pleasant male appearing in no acute distress NECK: No JVD; No carotid bruits CARDIAC: RRR, no murmurs, rubs, gallops RESPIRATORY:  Clear to auscultation without rales, wheezing or rhonchi  ABDOMEN: Appears non-distended. No obvious abdominal masses. EXTREMITIES: No clubbing or cyanosis. No pitting edema.  Distal pedal pulses are 2+ bilaterally.   Assessment and Plan:    1. Coronary artery disease involving native coronary artery of native heart without angina pectoris - He previously had a STEMI in 2016 with DES to the RCA and most recent ischemic evaluation was a low-risk NST in 03/2017. - As discussed above, his chest pain at this time overall seems atypical for a cardiac etiology as it occurred after lifting heavy objects and is reproducible on palpation today. Reports pain does not resemble his prior angina. Overall, appears most consistent with musculoskeletal pain. Given his recent reassuring ER workup and atypical symptoms, he does not require further cardiac testing at his time. We reviewed warning signs to monitor for which would warrant a follow-up stress test. - Continue current medical therapy with ASA 81 mg daily, Plavix  75 mg daily (has been on long-term DAPT per his primary cardiologist), Atorvastatin  80 mg daily, Coreg  3.125 mg twice daily and Zetia  10 mg daily.  2. Hyperlipidemia LDL goal <70 - FLP in 01/2024 showed total cholesterol 110, triglycerides 121, HDL 40 and LDL 48. Continue current medical therapy with Atorvastatin  80 mg daily and Zetia  10 mg daily.  3. Ascending aortic aneurysm/Ulcer of descending thoracic aorta - CT in 01/2024 showed stable fusiform aneurysm of the ascending aorta measuring 4.1 cm with follow-up imaging recommended in 1 year. 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 Was referred to Vascular but does not appear that he followed up with them. Will order a follow-up CT for 01/2025 and may need to refer to Vascular based off results.  4. COPD - Followed by his PCP. Not currently using inhalers.  Signed, Laymon CHRISTELLA Qua, PA-C   "

## 2024-12-01 NOTE — Patient Instructions (Addendum)
" °  Medication Instructions:   Your physician recommends that you continue on your current medications as directed. Please refer to the Current Medication list given to you today.  Labwork: None today  Testing/Procedures: Schedule Chest CT-Aorta for March 2026  Follow-Up: 6 months Laniesha Das,PA-C  Any Other Special Instructions Will Be Listed Below (If Applicable).  If you need a refill on your cardiac medications before your next appointment, please call your pharmacy.  "

## 2024-12-02 ENCOUNTER — Encounter: Payer: Self-pay | Admitting: Student

## 2024-12-07 ENCOUNTER — Ambulatory Visit: Payer: Self-pay

## 2024-12-07 NOTE — Telephone Encounter (Signed)
 FYI Only or Action Required?: Action required by provider: request for appointment.  Patient was last seen in primary care on 10/13/2024 by [name], MD.  Called Nurse Triage reporting Chest Pain.  Symptoms began several weeks ago.  Interventions attempted: Nothing.  Symptoms are: gradually worsening.  Triage Disposition: See PCP When Office is Open (Within 3 Days)  Patient/caregiver understands and will follow disposition?: Yes, will follow disposition  Reason for Triage: [name], daughter, called and stated that the patient is coughing up really dark fling. Fling is orange in color. Patient is also experiencing some severe chest pains. The coughing of fling began about 4 to 5 days ago and the chest pain has been happening for a while now.   Reason for Disposition  [1] Chest pain from known angina comes and goes AND [2] is NOT happening more often (increasing in frequency) or getting worse (increasing in severity)  Answer Assessment - Initial Assessment Questions 1. LOCATION: Where does it hurt?       L side 2. RADIATION: Does the pain go anywhere else? (e.g., into neck, jaw, arms, back)     denies 3. ONSET: When did the chest pain begin? (Minutes, hours or days)      About 2 weeks ago, cough started about 4-5 days ago 4. PATTERN: Does the pain come and go, or has it been constant since it started?  Does it get worse with exertion?      constant 5. DURATION: How long does it last (e.g., seconds, minutes, hours)     constant 6. SEVERITY: How bad is the pain?  (e.g., Scale 1-10; mild, moderate, or severe)     Severe per caller 9. CAUSE: What do you think is causing the chest pain?     ED told pt that it was a pulled muscle 10. OTHER SYMPTOMS: Do you have any other symptoms? (e.g., dizziness, nausea, vomiting, sweating, fever, difficulty breathing, cough)       Cough  Routing HP to clinic, no appt available at this time.  Protocols used: Chest  Pain-A-AH

## 2024-12-07 NOTE — Telephone Encounter (Signed)
 Pt scheduled with PCP tomorrow at 9:50.

## 2024-12-08 ENCOUNTER — Telehealth: Payer: Self-pay | Admitting: Family Medicine

## 2024-12-08 ENCOUNTER — Ambulatory Visit: Admitting: Family Medicine

## 2024-12-08 ENCOUNTER — Telehealth: Payer: Self-pay

## 2024-12-08 ENCOUNTER — Encounter: Payer: Self-pay | Admitting: Family Medicine

## 2024-12-08 ENCOUNTER — Ambulatory Visit

## 2024-12-08 VITALS — BP 94/59 | HR 72 | Temp 97.1°F | Wt 148.0 lb

## 2024-12-08 DIAGNOSIS — J9801 Acute bronchospasm: Secondary | ICD-10-CM | POA: Diagnosis not present

## 2024-12-08 DIAGNOSIS — J189 Pneumonia, unspecified organism: Secondary | ICD-10-CM

## 2024-12-08 DIAGNOSIS — Z8521 Personal history of malignant neoplasm of larynx: Secondary | ICD-10-CM | POA: Diagnosis not present

## 2024-12-08 MED ORDER — HYDROCODONE BIT-HOMATROP MBR 5-1.5 MG/5ML PO SOLN: 5.0000 mL | Freq: Four times a day (QID) | ORAL | 0 refills | Status: AC | PRN

## 2024-12-08 MED ORDER — HYDROCODONE BIT-HOMATROP MBR 5-1.5 MG/5ML PO SOLN: 5.0000 mL | Freq: Four times a day (QID) | ORAL | 0 refills | Status: DC | PRN

## 2024-12-08 MED ORDER — MOXIFLOXACIN HCL 400 MG PO TABS: 400.0000 mg | ORAL_TABLET | Freq: Every day | ORAL | 0 refills | Status: DC

## 2024-12-08 MED ORDER — BETAMETHASONE SOD PHOS & ACET 6 (3-3) MG/ML IJ SUSP
6.0000 mg | Freq: Once | INTRAMUSCULAR | Status: AC
  Administered 2024-12-08: 6 mg via INTRAMUSCULAR

## 2024-12-08 MED ORDER — CEFTRIAXONE SODIUM 1 G IJ SOLR
1.0000 g | Freq: Once | INTRAMUSCULAR | Status: AC
  Administered 2024-12-08: 1 g via INTRAMUSCULAR

## 2024-12-08 NOTE — Telephone Encounter (Signed)
 N/a

## 2024-12-08 NOTE — Telephone Encounter (Signed)
 Already fixed. LS

## 2024-12-08 NOTE — Progress Notes (Signed)
 "  Subjective:  Patient ID: Cory Sloan, male    DOB: [birth year]  Age: 79 y.o. MRN: [id number]  CC: Hospitalization Follow-up (Not able to get much out of trach. Orange yellow mucous stick. Still having wheezing and chest pain on left side. Very fatigued and not eating hardly at all. ) and Neurologic Problem (Needs referral to neurology. DSS requested due to patient getting lost one night. )   HPI  Discussed the use of AI scribe software for clinical note transcription with the patient, who gave verbal consent to proceed.  History of Present Illness Cory Sloan is a 79 year old male who presents with chest pain and shortness of breath.  He experiences constant, severe chest pain on the left side, described as 'can't hardly stand it.' The pain has been persistent and unchanged since his recent hospital visit a couple of weeks ago. It does not improve or worsen with any specific actions.  He also has shortness of breath and a significant cough, which occurs frequently. 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 He has a loss of appetite and is not eating properly, which concerns his daughter. He also experiences nasal congestion with a sensation of a runny nose.  His daughter mentions that a child psychotherapist is involved due to an incident where he got lost a month ago, and there is a request for a referral to a neurologist.  [facility] [place] but has moved to [place], and there is a need to transfer his prescriptions to a local pharmacy. Oducing yellow blood tinged sputum that is thick and hard to clear.          10/13/2024   11:08 AM 08/31/2024   10:24 AM 02/06/2024    1:33 PM  Depression screen PHQ 2/9  Decreased Interest 1 0 1  Down, Depressed, Hopeless 1 3 1   PHQ - 2 Score 2 3 2   Altered sleeping 0 0 1  Tired, decreased energy 3 0 2  Change in  appetite 1 2 1   Feeling bad or failure about yourself  1 3 3   Trouble concentrating 2 0 0  Moving slowly or fidgety/restless 0 1 1  Suicidal thoughts 0 1 3  PHQ-9 Score 9 10  13    Difficult doing work/chores Very difficult  Not difficult at all     Data saved with a previous flowsheet row definition    History Mathayus has a past medical history of Carotid artery stenosis, COPD (chronic obstructive pulmonary disease) (HCC), HLD (hyperlipidemia), ST elevation (STEMI) myocardial infarction involving right coronary artery (HCC) (10/04/2015), STEMI (ST elevation myocardial infarction) [facility]) (November 2016), 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   He has a past surgical history that includes Appendectomy; Throat surgery (2005); Cardiac catheterization (N/A, 10/04/2015); Cardiac catheterization (N/A, 10/04/2015); and Colonoscopy (12/23/2019).   His family history includes Brain cancer in his father; Breast cancer in his mother; CAD in his brother; Cancer - Other in his mother; Colon polyps in his brother.He reports that he quit smoking about 21 years ago. His smoking use included cigarettes. He started smoking about 61 years ago. He has a 60 pack-year smoking history. He has never used smokeless tobacco. He reports that he does not drink alcohol and does not use drugs.    ROS Review of Systems  Constitutional:  Positive for fatigue.  HENT:  Positive  for congestion.   Eyes:  Negative for visual disturbance.  Respiratory:  Positive for cough, chest tightness and shortness of breath.   Cardiovascular:  Positive for chest pain. Negative for leg swelling.  Gastrointestinal:  Negative for abdominal pain, diarrhea, nausea and vomiting.  Genitourinary:  Negative for difficulty urinating.  Musculoskeletal:  Negative for arthralgias and myalgias.  Skin:  Negative for rash.  Neurological:  Negative for headaches.  Psychiatric/Behavioral:  Negative for sleep disturbance.     Objective:  BP (!)  94/59   Pulse 72   Temp (!) 97.1 F (36.2 C)   Wt 148 lb (67.1 kg)   SpO2 94%   BMI 20.64 kg/m   BP Readings from Last 3 Encounters:  12/08/24 (!) 94/59  12/01/24 102/74  11/28/24 (!) 152/91    Wt Readings from Last 3 Encounters:  12/08/24 148 lb (67.1 kg)  12/01/24 150 lb (68 kg)  11/28/24 155 lb (70.3 kg)     Physical Exam Physical Exam GENERAL: Alert, cooperative, well developed, no acute distress. HEENT: Normocephalic, normal oropharynx, moist mucous membranes. CHEST: Lungs congested with wheezing present. CARDIOVASCULAR: Normal heart rate and rhythm, S1 and S2 normal without murmurs. ABDOMEN: Soft, non-tender, non-distended, without organomegaly, normal bowel sounds. EXTREMITIES: No cyanosis or edema. NEUROLOGICAL: Cranial nerves grossly intact, moves all extremities without gross motor or sensory deficit.  CXR - lingular pneumonia (infiltrate) Preliminary reading done by Butler Zollie COME  Assessment & Plan:  Cough due to bronchospasm -     DG Chest 2 View; Future  History of laryngeal cancer  Pneumonia of left upper lobe due to infectious organism -     cefTRIAXone  Sodium -     Betamethasone  Sod Phos & Acet  Other orders -     HYDROcodone  Bit-Homatrop MBr; Take 5 mLs by mouth every 6 (six) hours as needed for up to 5 days for cough.  Dispense: 100 mL; Refill: 0 -     Moxifloxacin  HCl; Take 1 tablet (400 mg total) by mouth daily.  Dispense: 10 tablet; Refill: 0    Assessment and Plan Assessment & Plan Pneumonia of left upper lobe   Chest x-ray confirms significant pneumonia in the left upper lobe, causing severe chest pain, shortness of breath, and wheezing. No fever or chills are present, but there is a loss of appetite. Fatigue and weakness are expected for approximately six weeks. Administered antibiotic and steroid injections in the buttocks. Prescribed moxifloxacin  orally once daily for 10 days. Provided cough syrup for severe coughing, to be used  sparingly due to its narcotic content, and recommended over-the-counter options like Robitussin or Delsym  for mild cough. Advised rest and limited activity to prevent blood clots and to avoid public places for six weeks to prevent further infection. A follow-up chest x-ray is planned in one week to assess improvement. He should seek hospital care if shortness of breath worsens or confusion increases.  Acute bronchospasm   Wheezing is noted on examination, likely secondary to pneumonia. Administered a steroid injection to manage wheezing.  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 Current symptoms may be exacerbated by pneumonia and the recovery process. The referral will be prioritized after recovery from pneumonia.  Major depressive disorder, moderate episode   Current symptoms may be influenced by pneumonia and the recovery process.       Follow-up: Return in about 5 days (around 12/13/2024), or if  symptoms worsen or fail to improve.  Butler Der, M.D. "

## 2024-12-08 NOTE — Telephone Encounter (Unsigned)
 Copied from CRM [id number]. Topic: Clinical - Prescription Issue >> Dec 08, 2024  2:18 PM [facility] wrote: Reason for CRM: Patient was seen today.. scripts were sent to the wrong location.. can Dr [facility]ollie [id number] [place] [place] - [address] [facility]?

## 2024-12-12 ENCOUNTER — Emergency Department (HOSPITAL_COMMUNITY)

## 2024-12-12 ENCOUNTER — Encounter (HOSPITAL_COMMUNITY): Payer: Self-pay

## 2024-12-12 ENCOUNTER — Emergency Department (HOSPITAL_COMMUNITY)
Admission: EM | Admit: 2024-12-12 | Discharge: 2024-12-12 | Disposition: A | Discharge status: Home / Self Care | Attending: Emergency Medicine | Admitting: Emergency Medicine

## 2024-12-12 ENCOUNTER — Other Ambulatory Visit: Payer: Self-pay

## 2024-12-12 DIAGNOSIS — R059 Cough, unspecified: Secondary | ICD-10-CM | POA: Diagnosis present

## 2024-12-12 DIAGNOSIS — I2510 Atherosclerotic heart disease of native coronary artery without angina pectoris: Secondary | ICD-10-CM | POA: Diagnosis not present

## 2024-12-12 DIAGNOSIS — Z7901 Long term (current) use of anticoagulants: Secondary | ICD-10-CM | POA: Diagnosis not present

## 2024-12-12 DIAGNOSIS — Z7982 Long term (current) use of aspirin: Secondary | ICD-10-CM | POA: Diagnosis not present

## 2024-12-12 DIAGNOSIS — J449 Chronic obstructive pulmonary disease, unspecified: Secondary | ICD-10-CM | POA: Insufficient documentation

## 2024-12-12 DIAGNOSIS — J181 Lobar pneumonia, unspecified organism: Secondary | ICD-10-CM | POA: Insufficient documentation

## 2024-12-12 DIAGNOSIS — J189 Pneumonia, unspecified organism: Secondary | ICD-10-CM

## 2024-12-12 LAB — COMPREHENSIVE METABOLIC PANEL WITH GFR
ALT: 20 U/L (ref 0–44)
AST: 19 U/L (ref 15–41)
Albumin: 3.4 g/dL — ABNORMAL LOW (ref 3.5–5.0)
Alkaline Phosphatase: 78 U/L (ref 38–126)
Anion gap: 10 (ref 5–15)
BUN: 14 mg/dL (ref 8–23)
CO2: 26 mmol/L (ref 22–32)
Calcium: 8.5 mg/dL — ABNORMAL LOW (ref 8.9–10.3)
Chloride: 102 mmol/L (ref 98–111)
Creatinine, Ser: 0.86 mg/dL (ref 0.61–1.24)
GFR, Estimated: >60 mL/min
Glucose, Bld: 85 mg/dL (ref 70–99)
Potassium: 4.8 mmol/L (ref 3.5–5.1)
Sodium: 138 mmol/L (ref 135–145)
Total Bilirubin: 0.3 mg/dL (ref 0.0–1.2)
Total Protein: 6.3 g/dL — ABNORMAL LOW (ref 6.5–8.1)

## 2024-12-12 LAB — CBC WITH DIFFERENTIAL/PLATELET
Abs Immature Granulocytes: 0.06 10*3/uL (ref 0.00–0.07)
Basophils Absolute: 0.1 10*3/uL (ref 0.0–0.1)
Basophils Relative: 1 %
Eosinophils Absolute: 0.1 10*3/uL (ref 0.0–0.5)
Eosinophils Relative: 1 %
HCT: 40.2 % (ref 39.0–52.0)
Hemoglobin: 13 g/dL (ref 13.0–17.0)
Immature Granulocytes: 1 %
Lymphocytes Relative: 17 %
Lymphs Abs: 1.7 10*3/uL (ref 0.7–4.0)
MCH: 30 pg (ref 26.0–34.0)
MCHC: 32.3 g/dL (ref 30.0–36.0)
MCV: 92.8 fL (ref 80.0–100.0)
Monocytes Absolute: 1 10*3/uL (ref 0.1–1.0)
Monocytes Relative: 10 %
Neutro Abs: 6.8 10*3/uL (ref 1.7–7.7)
Neutrophils Relative %: 70 %
Platelets: 506 10*3/uL — ABNORMAL HIGH (ref 150–400)
RBC: 4.33 MIL/uL (ref 4.22–5.81)
RDW: 12.4 % (ref 11.5–15.5)
WBC: 9.7 10*3/uL (ref 4.0–10.5)
nRBC: 0 % (ref 0.0–0.2)

## 2024-12-12 LAB — D-DIMER, QUANTITATIVE: D-Dimer, Quant: 1.5 ug{FEU}/mL — ABNORMAL HIGH (ref 0.00–0.50)

## 2024-12-12 MED ORDER — SODIUM CHLORIDE 0.9 % IV BOLUS
500.0000 mL | Freq: Once | INTRAVENOUS | Status: AC
  Administered 2024-12-12: 500 mL via INTRAVENOUS

## 2024-12-12 MED ORDER — IOHEXOL 350 MG/ML SOLN
75.0000 mL | Freq: Once | INTRAVENOUS | Status: AC | PRN
  Administered 2024-12-12: 75 mL via INTRAVENOUS

## 2024-12-12 MED ORDER — AZITHROMYCIN 250 MG PO TABS
500.0000 mg | ORAL_TABLET | Freq: Once | ORAL | Status: AC
  Administered 2024-12-12: 500 mg via ORAL
  Filled 2024-12-12: qty 2

## 2024-12-12 MED ORDER — SODIUM CHLORIDE 0.9 % IV SOLN
1.0000 g | Freq: Once | INTRAVENOUS | Status: AC
  Administered 2024-12-12: 1 g via INTRAVENOUS
  Filled 2024-12-12: qty 10

## 2024-12-12 NOTE — ED Notes (Signed)
 Patient transported to CT

## 2024-12-12 NOTE — ED Provider Notes (Signed)
 "  EMERGENCY DEPARTMENT AT [facility] Provider Note   CSN: [id number] Arrival date & time: 12/12/24  1159     Patient presents with: No chief complaint on file.   Cory Sloan is a 79 y.o. male.   Patient has a history of coronary artery disease COPD laryngeal cancer that he had resected 20 years ago.  Patient states he has been coughing up small amounts of blood for a number of weeks and was seen by his primary care doctor on Tuesday and started on antibiotics for pneumonia here.  Patient states he coughed up some blood today.  The history is provided by the patient and medical records. No language interpreter was used.  Cough Cough characteristics:  Non-productive Sputum characteristics:  Bloody Severity:  Mild Onset quality:  Gradual Timing:  Intermittent Progression:  Waxing and waning Chronicity:  New Smoker: no   Context: not animal exposure   Relieved by:  Nothing Worsened by:  Nothing Ineffective treatments:  None tried Associated symptoms: no chest pain, no eye discharge, no headaches and no rash        Prior to Admission medications  Medication Sig Start Date End Date Taking? Authorizing Provider  aspirin  EC 81 MG tablet Take 1 tablet (81 mg total) by mouth daily. 07/27/24   [name], MD  atorvastatin  (LIPITOR ) 80 MG tablet TAKE 1 TABLET (80 MG TOTAL) BY MOUTH DAILY AT 6 PM. 12/01/24   Strader, Brittany M, PA-C  benzonatate  (TESSALON ) 100 MG capsule Take 1 capsule (100 mg total) by mouth every 8 (eight) hours. Patient not taking: Reported on 12/08/2024 11/28/24   Suellen Cantor A, PA-C  carvedilol  (COREG ) 3.125 MG tablet Take 1 tablet (3.125 mg total) by mouth 2 (two) times daily with a meal. 10/13/24   [name], MD  clopidogrel  (PLAVIX ) 75 MG tablet Take 1 tablet (75 mg total) by mouth daily. 10/13/24   [name], MD  donepezil  (ARICEPT  ODT) 5 MG disintegrating tablet Take 1 tablet (5 mg total) by mouth at bedtime. 10/13/24    [name], MD  escitalopram  (LEXAPRO ) 20 MG tablet Take 1 tablet (20 mg total) by mouth at bedtime. 10/13/24   [name], MD  ezetimibe  (ZETIA ) 10 MG tablet Take 1 tablet (10 mg total) by mouth daily. 12/01/24   Strader, Laymon HERO, PA-C  HYDROcodone  bit-homatropine (HYCODAN) 5-1.5 MG/5ML syrup Take 5 mLs by mouth every 6 (six) hours as needed for up to 5 days for cough. 12/08/24 12/13/24  [name], MD  moxifloxacin  (AVELOX ) 400 MG tablet Take 1 tablet (400 mg total) by mouth daily. 12/08/24   [name], MD  nitroGLYCERIN  (NITROSTAT ) 0.4 MG SL tablet Place 1 tablet (0.4 mg total) under the tongue every 5 (five) minutes as needed for chest pain (CP or SOB). 12/01/24   Strader, Laymon HERO, PA-C  Soft Lens Products (STERILE SALINE) SOLN Use saily to flush trach after use of Inhaler Jonda) 12/16/18   [name], MD    Allergies: Patient has no known allergies.    Review of Systems  Constitutional:  Negative for appetite change and fatigue.  HENT:  Negative for congestion, ear discharge and sinus pressure.   Eyes:  Negative for discharge.  Respiratory:  Positive for cough.        Coughing up blood  Cardiovascular:  Negative for chest pain.  Gastrointestinal:  Negative for abdominal pain and diarrhea.  Genitourinary:  Negative for frequency and hematuria.  Musculoskeletal:  Negative for back  pain.  Skin:  Negative for rash.  Neurological:  Negative for seizures and headaches.  Psychiatric/Behavioral:  Negative for hallucinations.     Updated Vital Signs BP (!) 147/81   Pulse (!) 53   Temp 98.5 F (36.9 C)   Resp 17   SpO2 95%   Physical Exam Vitals and nursing note reviewed.  Constitutional:      Appearance: He is well-developed.  HENT:     Head: Normocephalic.     Comments: Patient has a trach    Nose: Nose normal.  Eyes:     General: No scleral icterus.    Conjunctiva/sclera: Conjunctivae normal.  Neck:     Thyroid : No thyromegaly.     Comments: Patient  with trach Cardiovascular:     Rate and Rhythm: Normal rate and regular rhythm.     Heart sounds: No murmur heard.    No friction rub. No gallop.  Pulmonary:     Breath sounds: No stridor. No wheezing or rales.  Chest:     Chest wall: No tenderness.  Abdominal:     General: There is no distension.     Tenderness: There is no abdominal tenderness. There is no rebound.  Musculoskeletal:        General: Normal range of motion.     Cervical back: Neck supple.  Lymphadenopathy:     Cervical: No cervical adenopathy.  Skin:    Findings: No erythema or rash.  Neurological:     Mental Status: He is alert and oriented to person, place, and time.     Motor: No abnormal muscle tone.     Coordination: Coordination normal.  Psychiatric:        Behavior: Behavior normal.     (all labs ordered are listed, but only abnormal results are displayed) Labs Reviewed  CBC WITH DIFFERENTIAL/PLATELET - Abnormal; Notable for the following components:      Result Value   Platelets 506 (*)    All other components within normal limits  COMPREHENSIVE METABOLIC PANEL WITH GFR - Abnormal; Notable for the following components:   Calcium  8.5 (*)    Total Protein 6.3 (*)    Albumin 3.4 (*)    All other components within normal limits  D-DIMER, QUANTITATIVE - Abnormal; Notable for the following components:   D-Dimer, Quant 1.50 (*)    All other components within normal limits    EKG: None  Radiology: [facility] Chest Port 1 View Result Date: 12/12/2024 CLINICAL DATA:  Shortness of breath.  Hemoptysis.  Hypoxia. EXAM: PORTABLE CHEST 1 VIEW COMPARISON:  12/08/2024 and 01/27/2024 FINDINGS: The heart size and mediastinal contours are within normal limits. Mild increase in infiltrate is seen in the left lung base since previous study. Decreased airspace opacity in the left lung apex is noted. Right lung is clear. No pleural effusion. IMPRESSION: Mild increase in left basilar infiltrate. Recommend continued chest  radiographic follow-up to confirm resolution. Decreased airspace opacity in left lung apex. Electronically Signed   By: Norleen DELENA Kil M.D.   On: 12/12/2024 14:10     Procedures   Medications Ordered in the ED  sodium chloride  0.9 % bolus 500 mL (0 mLs Intravenous Stopped 12/12/24 1422)  iohexol  (OMNIPAQUE ) 350 MG/ML injection 75 mL (75 mLs Intravenous Contrast Given 12/12/24 1450)   Labs unremarkable except for elevated D-dimer.  Chest x-ray shows possible left basilar infiltrate.  Patient is on antibiotics since Tuesday.  Medical Decision Making Amount and/or Complexity of Data Reviewed Labs: ordered. Radiology: ordered.  Risk Prescription drug management.   Hemoptysis, community-acquired pneumonia.  Patient will continue his antibiotics and hold his Plavix  and aspirin  for 4 days.  He will follow-up with his PCP     Final diagnoses:  None    ED Discharge Orders     None          Suzette Pac, MD 12/12/24 2019  "

## 2024-12-12 NOTE — ED Notes (Signed)
Dr Zammit at bedside. 

## 2024-12-12 NOTE — ED Notes (Signed)
 Patient's O2 sats were 97% on room air upon arrival to ED room 17. Family reports he became short of breath when walking from his car into the ER.

## 2024-12-12 NOTE — ED Notes (Signed)
Pt assisted to and from the restroom.

## 2024-12-12 NOTE — ED Notes (Signed)
 ..  The patient is A&OX4, ambulatory at d/c with independent steady gait, NAD. Pt verbalized understanding of d/c instructions and follow up care.

## 2024-12-12 NOTE — Discharge Instructions (Addendum)
 Do not take your Plavix  or aspirin  for 4 days.  Continue the antibiotic you are on.  Follow-up with her doctor next week as planned.  And follow-up with the lung doctor in the next few weeks -  call office Monday to arrange appointment.   Return to ER if worse, new symptoms, high fevers, increased trouble breathing, recurrent/persistent coughing up of blood or if coughing up large volume of blood, weak/fainting, or other concern.

## 2024-12-12 NOTE — ED Provider Notes (Signed)
 Signed out to d/c to home if/when CT neck resulted.   CT neck without acute process.  Pt is breathing comfortably. O2 sats good.   Pt currently appears stable for ED d/c per Dr Rockland plan.      Bernard Drivers, MD 12/12/24 [phone]

## 2024-12-12 NOTE — ED Triage Notes (Signed)
 Pt presents to ED from home C/O hemoptysis and SOB. SaO2 85% on room air in triage, chronic trach pt. Taken back to room immediately, respiratory called and primary RN made aware of patient.

## 2024-12-15 ENCOUNTER — Ambulatory Visit: Admitting: Family Medicine

## 2024-12-15 ENCOUNTER — Encounter: Payer: Self-pay | Admitting: Family Medicine

## 2024-12-15 VITALS — BP 109/73 | HR 60 | Temp 98.2°F | Wt 151.0 lb

## 2024-12-15 DIAGNOSIS — Z8521 Personal history of malignant neoplasm of larynx: Secondary | ICD-10-CM | POA: Diagnosis not present

## 2024-12-15 DIAGNOSIS — J189 Pneumonia, unspecified organism: Secondary | ICD-10-CM | POA: Diagnosis not present

## 2024-12-15 DIAGNOSIS — I10 Essential (primary) hypertension: Secondary | ICD-10-CM

## 2024-12-15 MED ORDER — HYDROCODONE BIT-HOMATROP MBR 5-1.5 MG/5ML PO SOLN: 5.0000 mL | Freq: Four times a day (QID) | ORAL | 0 refills | Status: AC | PRN

## 2024-12-15 MED ORDER — MOXIFLOXACIN HCL 400 MG PO TABS: 400.0000 mg | ORAL_TABLET | Freq: Every day | ORAL | 0 refills | Status: AC

## 2024-12-15 NOTE — Progress Notes (Signed)
 "  Subjective:  Patient ID: Cory Sloan, male    DOB: March 15, 1946  Age: 79 y.o. MRN: [id number]  CC: Pneumonia (Still having chest pain. Er on Saturday. Coughing up thick bright red blood. Bax drip and was taken of plavix  and aspirin  for 4 days. No more blood but still having chest pain and mucous. )   HPI  Discussed the use of AI scribe software for clinical note transcription with the patient, who gave verbal consent to proceed.  History of Present Illness Cory Sloan is a 79 year old male with emphysema and coronary artery disease who presents with recent pneumonia and hemoptysis. He is accompanied by his daughter, who is his primary caregiver.  He was seen in the emergency department on Saturday due to hemoptysis from his tracheostomy. During the visit, he was diagnosed with pneumonia after a chest x-ray and blood work. He received IV fluids and was discharged home. Since then, he has not experienced further hemoptysis.  He was instructed to stop taking Plavix  and aspirin  for four days following the emergency visit. He had taken his last dose of these medications on Saturday morning. He has a history of a stent placement in his heart nine years ago. He is currently off Plavix  and aspirin , and his daughter is seeking guidance on when to resume these medications.  He has a known history of emphysema, attributed to long-term smoking. A recent CT scan of the chest ruled out blood clots. He has not experienced significant shortness of breath since leaving the ER. He is currently on amoxifloxacin for pneumonia, but the initial prescription was only for a week, and he has completed it.  He has not had his blood pressure medication, olmesartan, for over a week due to a prescription issue. His blood pressure readings have been between 133/27 and 148/unknown, as recorded in his log book.          12/15/2024    2:55 PM 10/13/2024   11:08 AM 08/31/2024   10:24 AM  Depression screen PHQ  2/9  Decreased Interest 0 1 0  Down, Depressed, Hopeless 2 1 3   PHQ - 2 Score 2 2 3   Altered sleeping 2 0 0  Tired, decreased energy 3 3 0  Change in appetite 3 1 2   Feeling bad or failure about yourself  1 1 3   Trouble concentrating 3 2 0  Moving slowly or fidgety/restless 0 0 1  Suicidal thoughts 1 0 1  PHQ-9 Score 15 9 10    Difficult doing work/chores Very difficult Very difficult      Data saved with a previous flowsheet row definition    History Cory Sloan has a past medical history of Carotid artery stenosis, COPD (chronic obstructive pulmonary disease) (HCC), HLD (hyperlipidemia), ST elevation (STEMI) myocardial infarction involving right coronary artery (HCC) (10/04/2015), STEMI (ST elevation myocardial infarction) [facility]) (November 2016), 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   He has a past surgical history that includes Appendectomy; Throat surgery (2005); Cardiac catheterization (N/A, 10/04/2015); Cardiac catheterization (N/A, 10/04/2015); and Colonoscopy (12/23/2019).   His family history includes Brain cancer in his father; Breast cancer in his mother; CAD in his brother; Cancer - Other in his mother; Colon polyps in his brother.He reports that he quit smoking about 21 years ago. His smoking use included cigarettes. He started smoking about 61 years ago. He has a 60 pack-year smoking history. He has never used smokeless tobacco. He reports that he does not drink alcohol and does  not use drugs.    ROS Review of Systems  Constitutional:  Positive for fatigue.  HENT: Negative.    Eyes:  Negative for visual disturbance.  Respiratory:  Positive for cough. Negative for shortness of breath.   Cardiovascular:  Negative for chest pain and leg swelling.  Gastrointestinal:  Negative for abdominal pain, diarrhea, nausea and vomiting.  Genitourinary:  Negative for difficulty urinating.  Musculoskeletal:  Negative for arthralgias and myalgias.  Skin:  Negative for rash.   Neurological:  Positive for weakness. Negative for headaches.  Psychiatric/Behavioral:  Negative for sleep disturbance.     Objective:  BP 109/73   Pulse 60   Temp 98.2 F (36.8 C)   Wt 151 lb (68.5 kg)   SpO2 98%   BMI 21.06 kg/m   BP Readings from Last 3 Encounters:  12/15/24 109/73  12/12/24 138/87  12/08/24 (!) 94/59    Wt Readings from Last 3 Encounters:  12/15/24 151 lb (68.5 kg)  12/08/24 148 lb (67.1 kg)  12/01/24 150 lb (68 kg)     Physical Exam Physical Exam VITALS: SaO2- 97% GENERAL: Alert, cooperative, well developed, no acute distress HEENT: Normocephalic, normal oropharynx, moist mucous membranes CHEST: Clear to auscultation bilaterally, no wheezes, rhonchi, or crackles. Lungs improved with adequate air movement and improved color. CARDIOVASCULAR: Normal heart rate and rhythm, S1 and S2 normal without murmurs ABDOMEN: Soft, non-tender, non-distended, without organomegaly, normal bowel sounds EXTREMITIES: No cyanosis or edema NEUROLOGICAL: Cranial nerves grossly intact, moves all extremities without gross motor or sensory deficit   Assessment & Plan:  Pneumonia of left upper lobe due to infectious organism  Essential hypertension  History of laryngeal cancer  Other orders -     Moxifloxacin  HCl; Take 1 tablet (400 mg total) by mouth daily.  Dispense: 10 tablet; Refill: 0 -     HYDROcodone  Bit-Homatrop MBr; Take 5 mLs by mouth every 6 (six) hours as needed for up to 5 days for cough.  Dispense: 100 mL; Refill: 0    Assessment and Plan Assessment & Plan Pneumonia   Left upper lobe pneumonia confirmed by chest x-ray. He recently visited the ER for hemoptysis and received IV fluids. Oxygen  saturation improved to 97%, and there is no current hemoptysis. CT chest ruled out pulmonary embolism. Emphysema likely contributes to respiratory symptoms. Lung sounds and color have improved. Continue amoxifloxacin for a total of 14 days. A follow-up appointment  is scheduled in one week. He is advised to rest and avoid strenuous activities for one week.  Emphysema   Chronic emphysema secondary to tobacco use contributes to respiratory symptoms but is not the primary cause of the current pneumonia. Monitor respiratory status and oxygen  saturation. Consider an inhaler if shortness of breath occurs.  Hypertension   Management was interrupted due to a prescription refill issue. Blood pressure readings are elevated, ranging from 133/27 to 148/unknown. Renewed prescription for olmesartan. Monitor blood pressure regularly.  History of coronary artery stent placement   A coronary artery stent was placed nine years ago. Plavix  and aspirin  were recently stopped due to bleeding risk from pneumonia. There is no immediate need to restart Plavix  given the time elapsed since stent placement. Hold Plavix  for one more week and restart aspirin  on Thursday.       Follow-up: Return in about 1 week (around 12/22/2024) for pneumonia.  Butler Der, M.D. "

## 2024-12-22 ENCOUNTER — Ambulatory Visit: Admitting: Family Medicine

## 2024-12-23 ENCOUNTER — Ambulatory Visit

## 2024-12-23 ENCOUNTER — Encounter: Payer: Self-pay | Admitting: Family Medicine

## 2024-12-23 ENCOUNTER — Ambulatory Visit: Admitting: Family Medicine

## 2024-12-23 VITALS — BP 107/70 | HR 61 | Temp 97.5°F | Ht 71.0 in | Wt 153.0 lb

## 2024-12-23 DIAGNOSIS — I2510 Atherosclerotic heart disease of native coronary artery without angina pectoris: Secondary | ICD-10-CM

## 2024-12-23 DIAGNOSIS — J9801 Acute bronchospasm: Secondary | ICD-10-CM | POA: Diagnosis not present

## 2024-12-23 DIAGNOSIS — J189 Pneumonia, unspecified organism: Secondary | ICD-10-CM

## 2024-12-23 DIAGNOSIS — J449 Chronic obstructive pulmonary disease, unspecified: Secondary | ICD-10-CM

## 2024-12-23 MED ORDER — DONEPEZIL HCL 10 MG PO TBDP: 10.0000 mg | ORAL_TABLET | Freq: Every day | ORAL | 1 refills | Status: AC

## 2024-12-23 MED ORDER — DONEPEZIL HCL 5 MG PO TBDP: 5.0000 mg | ORAL_TABLET | Freq: Every day | ORAL | 0 refills | Status: DC

## 2024-12-23 MED ORDER — ESCITALOPRAM OXALATE 20 MG PO TABS: 20.0000 mg | ORAL_TABLET | Freq: Every day | ORAL | 1 refills | Status: AC

## 2024-12-23 NOTE — Progress Notes (Signed)
 "  Subjective:  Patient ID: Cory Sloan, male    DOB: July 15, 1946  Age: 79 y.o. MRN: [id number]  CC: Medical Management of Chronic Issues   HPI  Discussed the use of AI scribe software for clinical note transcription with the patient, who gave verbal consent to proceed.  History of Present Illness Cory Sloan is a 79 year old male with emphysema who presents for follow-up of respiratory symptoms and chest pain.  He has experienced improvement in his respiratory symptoms this week, with a reduction in coughing and sputum production. The sputum is clear, and he has had less shortness of breath. No recent hemoptysis, either orally or through his ostomy.  He experiences intermittent chest pain on the left side, lasting about five to ten minutes. The pain does not radiate to his jaw, shoulder, or elsewhere. He describes the sensation as not having been 'really bad' recently, although he has been holding his chest for a couple of days.  He is currently taking moxifloxacin , with a few doses remaining, at a frequency of one per day. He also takes atorvastatin , one tablet at 6 PM daily, and has been prescribed nitroglycerin .  He has a history of memory problems, which are being monitored, and he is scheduled for a follow-up in six weeks to assess this further. He is also monitoring his blood pressure daily, with recent readings being slightly low.          12/23/2024    9:59 AM 12/15/2024    2:55 PM 10/13/2024   11:08 AM  Depression screen PHQ 2/9  Decreased Interest 0 0 1  Down, Depressed, Hopeless 2 2 1   PHQ - 2 Score 2 2 2   Altered sleeping 1 2 0  Tired, decreased energy 3 3 3   Change in appetite 2 3 1   Feeling bad or failure about yourself  2 1 1   Trouble concentrating 0 3 2  Moving slowly or fidgety/restless 0 0 0  Suicidal thoughts 0 1 0  PHQ-9 Score 10 15 9   Difficult doing work/chores Somewhat difficult Very difficult Very difficult    History Cory Sloan has a past  medical history of Carotid artery stenosis, COPD (chronic obstructive pulmonary disease) (HCC), HLD (hyperlipidemia), ST elevation (STEMI) myocardial infarction involving right coronary artery (HCC) (10/04/2015), STEMI (ST elevation myocardial infarction) [facility]) (November 2016), 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   He has a past surgical history that includes Appendectomy; Throat surgery (2005); Cardiac catheterization (N/A, 10/04/2015); Cardiac catheterization (N/A, 10/04/2015); and Colonoscopy (12/23/2019).   His family history includes Brain cancer in his father; Breast cancer in his mother; CAD in his brother; Cancer - Other in his mother; Colon polyps in his brother.He reports that he quit smoking about 21 years ago. His smoking use included cigarettes. He started smoking about 61 years ago. He has a 60 pack-year smoking history. He has never used smokeless tobacco. He reports that he does not drink alcohol and does not use drugs.    ROS Review of Systems  Constitutional: Negative.   HENT: Negative.    Eyes:  Negative for visual disturbance.  Respiratory:  Positive for cough (improving, less productive). Negative for shortness of breath.   Cardiovascular:  Positive for chest pain (see HPI). Negative for leg swelling.  Gastrointestinal:  Negative for abdominal pain, diarrhea, nausea and vomiting.  Genitourinary:  Negative for difficulty urinating.  Musculoskeletal:  Negative for arthralgias and myalgias.  Skin:  Negative for rash.  Neurological:  Negative for headaches.  Psychiatric/Behavioral:  Negative for sleep disturbance.     Objective:  BP 107/70   Pulse 61   Temp (!) 97.5 F (36.4 C)   Ht 5' 11 (1.803 m)   Wt 153 lb (69.4 kg)   SpO2 96%   BMI 21.34 kg/m   BP Readings from Last 3 Encounters:  12/23/24 107/70  12/15/24 109/73  12/12/24 138/87    Wt Readings from Last 3 Encounters:  12/23/24 153 lb (69.4 kg)  12/15/24 151 lb (68.5 kg)  12/08/24 148 lb (67.1 kg)      Physical Exam Physical Exam GENERAL: Alert, cooperative, well developed, no acute distress. HEENT: Normocephalic, normal oropharynx, moist mucous membranes. CHEST: Clear to auscultation bilaterally, no wheezes, rhonchi, or crackles. CARDIOVASCULAR: Normal heart rate and rhythm, S1 and S2 normal without murmurs. ABDOMEN: Soft, non-tender, non-distended, without organomegaly, normal bowel sounds. EXTREMITIES: No cyanosis or edema. NEUROLOGICAL: Cranial nerves grossly intact, moves all extremities without gross motor or sensory deficit.   Assessment & Plan:  Chronic obstructive pulmonary disease, unspecified COPD type (HCC)  Pneumonia of left upper lobe due to infectious organism -     DG Chest 2 View; Future  Cough due to bronchospasm  Coronary artery disease involving native coronary artery of native heart without angina pectoris -     EKG 12-Lead  Other orders -     Escitalopram  Oxalate; Take 1 tablet (20 mg total) by mouth at bedtime.  Dispense: 90 tablet; Refill: 1 -     Donepezil  HCl; Take 1 tablet (10 mg total) by mouth at bedtime.  Dispense: 90 tablet; Refill: 1    Assessment and Plan Assessment & Plan Chronic obstructive pulmonary disease   COPD symptoms have improved with reduced cough, sputum production, and dyspnea. No hemoptysis is present. He experiences chest pain on the left side lasting 5-10 minutes, not radiating to the jaw or shoulder. A recent CT scan showed no signs of cancer. A chest x-ray is ordered, and an EKG has been performed. Continue moxifloxacin  as prescribed.  Coronary artery disease   He has a history of coronary artery stent placement and experiences left-sided chest pain that does not radiate. An EKG was recently ordered by the ER physician. Plavix  is held until after the CT scan due to recent hemoptysis. Follow up with a heart specialist after the CT scan.  Essential hypertension   Blood pressure readings are slightly low but acceptable for  cardiac concerns. He should monitor blood pressure daily at home and report any significant drops.  Aneurysm   A CT scan is scheduled in six weeks to evaluate the aneurysm. The pain is not attributed to the aneurysm as it is localized to the left side. Proceed with the scheduled CT scan.  Memory loss   Ongoing memory issues are addressed by increasing the medication dose. Potential side effects include jitteriness and nausea, which may be minimized by taking the medication at bedtime.  General health maintenance   He is due for a tetanus vaccine.       Follow-up: Return in about 6 weeks (around 02/03/2025).  Butler Der, M.D.  "

## 2024-12-31 ENCOUNTER — Ambulatory Visit: Admitting: Physician Assistant

## 2025-01-13 ENCOUNTER — Ambulatory Visit: Admitting: Family Medicine

## 2025-02-02 ENCOUNTER — Other Ambulatory Visit (HOSPITAL_COMMUNITY)

## 2025-02-03 ENCOUNTER — Ambulatory Visit: Admitting: Family Medicine
